# Patient Record
Sex: Female | Born: 1940 | Race: Black or African American | Hispanic: No | Smoking: Never smoker
Health system: Southern US, Community
[De-identification: ages and names within clinical notes are randomized; demographics above are authoritative.]

## PROBLEM LIST (undated history)

## (undated) DIAGNOSIS — E119 Type 2 diabetes mellitus without complications: Secondary | ICD-10-CM

## (undated) DIAGNOSIS — M1990 Unspecified osteoarthritis, unspecified site: Secondary | ICD-10-CM

## (undated) DIAGNOSIS — R001 Bradycardia, unspecified: Secondary | ICD-10-CM

## (undated) DIAGNOSIS — I10 Essential (primary) hypertension: Secondary | ICD-10-CM

## (undated) DIAGNOSIS — E7800 Pure hypercholesterolemia, unspecified: Secondary | ICD-10-CM

## (undated) DIAGNOSIS — D649 Anemia, unspecified: Secondary | ICD-10-CM

## (undated) DIAGNOSIS — K219 Gastro-esophageal reflux disease without esophagitis: Secondary | ICD-10-CM

## (undated) HISTORY — DX: Gastro-esophageal reflux disease without esophagitis: K21.9

## (undated) HISTORY — DX: Unspecified osteoarthritis, unspecified site: M19.90

## (undated) HISTORY — DX: Type 2 diabetes mellitus without complications: E11.9

## (undated) HISTORY — DX: Pure hypercholesterolemia, unspecified: E78.00

## (undated) HISTORY — DX: Anemia, unspecified: D64.9

## (undated) HISTORY — PX: CHOLECYSTECTOMY: SHX55

## (undated) HISTORY — DX: Bradycardia, unspecified: R00.1

## (undated) HISTORY — DX: Essential (primary) hypertension: I10

## (undated) HISTORY — PX: TUBAL LIGATION: SHX77

---

## 1999-03-18 HISTORY — PX: COLONOSCOPY: SHX174

## 2001-07-02 ENCOUNTER — Ambulatory Visit (HOSPITAL_COMMUNITY): Admission: RE | Admit: 2001-07-02 | Discharge: 2001-07-02 | Payer: Self-pay | Admitting: Internal Medicine

## 2001-07-02 HISTORY — PX: ESOPHAGOGASTRODUODENOSCOPY: SHX1529

## 2001-08-02 ENCOUNTER — Ambulatory Visit (HOSPITAL_COMMUNITY): Admission: RE | Admit: 2001-08-02 | Discharge: 2001-08-02 | Payer: Self-pay | Admitting: Occupational Therapy

## 2001-08-02 ENCOUNTER — Encounter: Payer: Self-pay | Admitting: Occupational Therapy

## 2002-04-03 ENCOUNTER — Encounter: Payer: Self-pay | Admitting: Emergency Medicine

## 2002-04-03 ENCOUNTER — Emergency Department (HOSPITAL_COMMUNITY): Admission: EM | Admit: 2002-04-03 | Discharge: 2002-04-03 | Payer: Self-pay | Admitting: Emergency Medicine

## 2002-08-17 ENCOUNTER — Encounter: Payer: Self-pay | Admitting: Occupational Therapy

## 2002-08-17 ENCOUNTER — Ambulatory Visit (HOSPITAL_COMMUNITY): Admission: RE | Admit: 2002-08-17 | Discharge: 2002-08-17 | Payer: Self-pay | Admitting: Occupational Therapy

## 2003-08-30 ENCOUNTER — Ambulatory Visit (HOSPITAL_COMMUNITY): Admission: RE | Admit: 2003-08-30 | Discharge: 2003-08-30 | Payer: Self-pay | Admitting: Occupational Therapy

## 2004-02-11 ENCOUNTER — Emergency Department (HOSPITAL_COMMUNITY): Admission: EM | Admit: 2004-02-11 | Discharge: 2004-02-11 | Payer: Self-pay | Admitting: Emergency Medicine

## 2004-02-13 ENCOUNTER — Ambulatory Visit: Payer: Self-pay | Admitting: Occupational Therapy

## 2004-09-04 ENCOUNTER — Ambulatory Visit (HOSPITAL_COMMUNITY): Admission: RE | Admit: 2004-09-04 | Discharge: 2004-09-04 | Payer: Self-pay | Admitting: Occupational Therapy

## 2005-10-02 ENCOUNTER — Ambulatory Visit (HOSPITAL_COMMUNITY): Admission: RE | Admit: 2005-10-02 | Discharge: 2005-10-02 | Payer: Self-pay | Admitting: Nurse Practitioner

## 2006-10-05 ENCOUNTER — Ambulatory Visit (HOSPITAL_COMMUNITY): Admission: RE | Admit: 2006-10-05 | Discharge: 2006-10-05 | Payer: Self-pay | Admitting: Nurse Practitioner

## 2010-05-05 ENCOUNTER — Encounter: Payer: Self-pay | Admitting: Family Medicine

## 2010-08-30 NOTE — Op Note (Signed)
[facility]  Patient:    HOSANNA, BETLEY Visit Number: [id number] MRN: [id number]          Service Type: END Location: DAY Attending Physician:  Jonathon Bellows Dictated by:   Roetta Sessions, M.D. Proc. Date: 07/02/01 Admit Date:  07/02/2001 Discharge Date: 07/02/2001   CC:         Sharen Hint, M.D.   Operative Report  PROCEDURE:   Esophagogastroduodenoscopy with [name] dilation followed by biopsy.  INDICATIONS FOR PROCEDURE:  The patient is a 70 year old lady with chronic GERD, well controlled on Prevacid.  She has had a 31-month history of insidious esophageal dysphagia that has led to progressive esophageal dysphagia. EGD is now being done to further evaluate her symptoms. Approach has been discussed with patient previously, potential risks, benefits, and alternatives and any questions answered.  She is agreeable.  Please see my June 07, 2001 consultation note for more information.  PROCEDURE NOTE:  O2 saturation, blood pressure, pulse, and respirations were monitored throughout the entire procedure.  Conscious sedation with Demerol 50 mg IV and Versed 2 mg IV.  She was bradycardic in the 50s and then the 40s during the procedure for which she received atropine 0.5 mg IV. Cetacaine spray for topical oropharyngeal anesthesia.  INSTRUMENT:  Olympus videochip gastroscope.  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 The scope was easily traversed across the ring. There was no esophagitis, no Barretts esophagus, and no tumor.  Stomach:  The gastric cavity insufflated well with air.  It was empty. Thorough examination of the gastric mucosa including retroflex view of the esophagogastric junction demonstrated a moderate size hernia and a 2 cm linear erosion and a 1 cm linear furrow, which really appeared to be an ulcer when inspected more closely.  This was just below the mucosa, straddling the diaphragmatic  hiatus.  Mucosa of the stomach otherwise appeared normal. Pylorus was patent and easily traversed.  Duodenum:  Bulb and second portion appeared normal.  THERAPEUTIC/DIAGNOSTIC MANEUVERS:   A #56 [place] Maloney dilator was passed to full insertion and patient tolerated the procedure well without blood return on the dilator.  Looking back revealed that the ring had been ruptured without apparent complications.  Subsequently, the linear ulcer in the distal cardia was biopsied.  The patient tolerated the procedure well and was reactive in endoscopy.  IMPRESSION: 1. Schatzkis ring, otherwise normal esophagus. 2. Status post dilation as described above. 3. Small to moderate size hiatal hernia. 4. Linear erosion and ulceration in the proximal stomach, as described above. 5. Biopsy of the remainder of the gastric mucosa and duodenum through the    second portion appeared normal. 6. The ulcerated lesion in the proximal stomach is likely benign.  This may    be a [name] lesion secondary to trauma to the mucosa, straddling the    diaphragmatic hiatus in the presence of a hiatal hernia.  RECOMMENDATIONS. 1. Continue Prevacid 30 mg orally daily. 2. Check H. pylori serologies. 3. Further recommendations to follow. Dictated by:   Roetta Sessions, M.D. Attending Physician:  Jonathon Bellows DD:  07/02/01 TD:  07/05/01 Job: [id number]

## 2011-07-10 ENCOUNTER — Encounter: Payer: Self-pay | Admitting: Internal Medicine

## 2011-07-14 ENCOUNTER — Ambulatory Visit (INDEPENDENT_AMBULATORY_CARE_PROVIDER_SITE_OTHER): Payer: Medicare PPO | Admitting: Urgent Care

## 2011-07-14 ENCOUNTER — Encounter: Payer: Self-pay | Admitting: Urgent Care

## 2011-07-14 DIAGNOSIS — Z136 Encounter for screening for cardiovascular disorders: Secondary | ICD-10-CM | POA: Insufficient documentation

## 2011-07-14 DIAGNOSIS — Z1211 Encounter for screening for malignant neoplasm of colon: Secondary | ICD-10-CM

## 2011-07-14 DIAGNOSIS — K219 Gastro-esophageal reflux disease without esophagitis: Secondary | ICD-10-CM | POA: Insufficient documentation

## 2011-07-14 DIAGNOSIS — R1310 Dysphagia, unspecified: Secondary | ICD-10-CM | POA: Insufficient documentation

## 2011-07-14 MED ORDER — PEG-KCL-NACL-NASULF-NA ASC-C 100 G PO SOLR: 1.0000 | Freq: Once | ORAL | Status: DC

## 2011-07-14 MED ORDER — DEXLANSOPRAZOLE 60 MG PO CPDR: 60.0000 mg | DELAYED_RELEASE_CAPSULE | Freq: Every day | ORAL | Status: DC

## 2011-07-14 NOTE — Assessment & Plan Note (Addendum)
Solid food dysphagia with regurgitation & chronic refractory GERD.  EGD with esophageal dilation as above.  Pt admits to not taking aspirin on a daily basis, states taking for cardiovascular preventative benefits.  Advised she may hold for 3 days prior to procedure.

## 2011-07-14 NOTE — Assessment & Plan Note (Signed)
Due for average risk screening colonoscopy w/ Dr Jena Gauss.  I have discussed risks & benefits which include, but are not limited to, bleeding, infection, perforation & drug reaction.  The patient agrees with this plan & written consent will be obtained.

## 2011-07-14 NOTE — Patient Instructions (Addendum)
EGD with possible dilation of your esophagus & colonoscopy w/ Dr Jena Gauss Stop OMEPRAZOLE START DEXILANT 60mg  daily Keep your appt with your heart doctor Dysphagia Diet Level 2, Mechanically Altered This dysphagia mechanically altered diet is restricted to:  Foods that are moist, soft-textured, and easy to chew and swallow.   Meats that are ground or minced to no larger than -inch pieces. Meats are moist with gravy or sauce added.   Foods that do not include bread or bread-like textures except soft pancakes, well-moistened with syrup or sauce.   Textures with some chewing ability required.   Casseroles without rice.   Cooked vegetables that are less than -inch in size and easily mashed with a fork. No cooked corn, peas, broccoli, cauliflower, cabbage, Brussels sprouts, asparagus, or other fibrous, non-tender, or rubbery cooked vegetables.   Canned fruit except for pineapple. Fruit must be cut into no larger than -inch pieces.   Foods that do not include nuts, seeds, coconut, or sticky textures.  FOOD TEXTURES Includes all foods listed on Dysphagia Diet Level 1, Pureed, in addition to the foods listed below. Beverages  Recommended: All beverages thickened to recommended consistency with minimal amounts of texture pulp. Any texture should be suspended in the liquid and should not fall out.   Avoid: All others.   You are currently limited to one of the following liquid consistency levels:   Thin.   Nectar-like.   Honey-like.   Spoon-thick.  Breads  Recommended: Soft pancakes, well-moistened with syrup or sauce.   Avoid: All others.  Cereals  Recommended: Cooked cereals with little texture, including oatmeal. Unprocessed wheat bran stirred into cereals for bulk. If thin liquids are restricted, it is important that all of the liquid is absorbed into the cereal.   Avoid: All dry cereals and any cooked cereals that may contain flax seeds or other seeds or nuts. Whole-grain,  dry, or coarse cereals. Cereals with nuts, seeds, dried fruit, or coconut.  Desserts  Recommended: Pudding, custard. Soft fruit pies with bottom crust only. Canned fruit (excluding pineapple). Soft, moist cakes with icing.   Avoid: Dry, coarse cakes and cookies. Anything with nuts, seeds, coconut, pineapple, or dried fruit. Breakfast yogurt with nuts. Rice or bread pudding.   These foods are considered thin liquids and should be avoided if thin liquids are restricted:   Frozen malts, milk shakes, frozen yogurt, eggnog, nutritional supplements, ice cream, sherbet, regular or sugar-free gelatin, or any foods that become thin liquid at either room temperature, 70 F (21.1 C) or body temperature, 98 F (36.7 C).  Fats  Recommended: Butter, margarine, cream for cereal (depending on liquid consistency recommendations), gravy, cream sauces, sour cream, sour cream dips with soft additives, mayonnaise, salad dressings, cream cheese, cream cheese spreads with soft additives, whipped toppings.   Avoid: All fats with coarse or chunky additives.  Fruits  Recommended: Soft drained, canned, or cooked fruits without seeds or skin. Fresh soft and ripe banana. Fruit juices with a small amount of pulp. If thin liquids are restricted, fruit juices should be thickened to appropriate consistency.   Avoid: Fresh or frozen fruits. Cooked fruit with skin or seeds. Dried fruits. Fresh, canned, or cooked pineapple.  Meats and Meat Substitutes Meat pieces should not exceed -inch cubes and should be tender.  Recommended: Moistened ground or cooked meat, poultry, or fish. Moist ground or tender meat may be served with gravy or sauce. Casseroles without rice. Moist macaroni and cheese, well-cooked pasta with meat sauce, tuna noodle  casserole, soft, moist lasagna. Moist meatballs, meatloaf, or fish loaf. Protein salads, such as tuna or egg without large chunks, celery, or onion. Cottage cheese, smooth quiche without large  chunks. Poached, scrambled, or soft-cooked eggs (egg yolks should not be "runny" but should be moist and able to be mashed with butter, margarine, or other moisture added to them). Cook eggs to 160 F (71.1 C) or use pasteurized eggs for safety. Souffls may have small, soft chunks. Tofu. Well-cooked, slightly mashed, moist legumes such as baked beans. All meats or protein substitutes should be served with sauces or moistened to help maintain cohesiveness in the mouth.   Avoid: Dry meats and tough meats, such as bacon, sausage, hot dogs, and bratwurst. Dry casseroles or casseroles with rice or large chunks. Peanut butter. Cheese slices and cubes. Hard-cooked or crisp fried eggs. Sandwiches. Pizza.  Potatoes and Starches  Recommended: Well-cooked, moistened, boiled, baked, or mashed potatoes. Well-cooked shredded hash brown potatoes that are not crisp. All potatoes need to be moist and in sauces. Well-cooked noodles in sauce. Spaetzel or soft dumplings that have been moistened with butter or gravy.   Avoid: Potato skins and chips. Fried or French-fried potatoes. Rice.  Soups  Recommended: Soups with easy-to-chew or easy-to-swallow meats or vegetables. Contents in soups should be less than -inch pieces. Soups will need to be thickened to appropriate consistency if soup is thinner than prescribed liquid consistency.   Avoid: Soups with large chunks of meat and vegetables. Soups with rice, corn, peas.  Vegetables  Recommended: All soft, well-cooked vegetables. Vegetables should be less than -inch pieces. They should be easily mashed with a fork.   Avoid: Cooked corn and peas. Broccoli, cabbage, Brussels sprouts, asparagus, or other fibrous, non-tender, or rubbery cooked vegetables.  Miscellaneous  Recommended: Jams and preserves without seeds, jelly. Sauces or salsas with small, tender, less than -inch pieces. Soft, smooth chocolate bars that are easily chewed.   Avoid: Seeds, nuts, coconut, or  sticky foods. Chewy candies such as caramels or licorice.  Document Released: 03/31/2005 Document Revised: 03/20/2011 Document Reviewed: 04/23/2009 [facility], [facility], [facility] Patient Information 2012 [place], [place].     Diet for GERD or PUD Nutrition therapy can help ease the discomfort of gastroesophageal reflux disease (GERD) and peptic ulcer disease (PUD).  HOME CARE INSTRUCTIONS   Eat your meals slowly, in a relaxed setting.   Eat 5 to 6 small meals per day.   If a food causes distress, stop eating it for a period of time.  FOODS TO AVOID  Coffee, regular or decaffeinated.   Cola beverages, regular or low calorie.   Tea, regular or decaffeinated.   Pepper.   Cocoa.   High fat foods, including meats.   Butter, margarine, hydrogenated oil (trans fats).   Peppermint or spearmint (if you have GERD).   Fruits and vegetables if not tolerated.   Alcohol.   Nicotine (smoking or chewing). This is one of the most potent stimulants to acid production in the gastrointestinal tract.   Any food that seems to aggravate your condition.  If you have questions regarding your diet, ask your caregiver or a registered dietitian. TIPS  Lying flat may make symptoms worse. Keep the head of your bed raised 6 to 9 inches (15 to 23 cm) by using a foam wedge or blocks under the legs of the bed.   Do not lay down until 3 hours after eating a meal.   Daily physical activity may help reduce symptoms.  MAKE SURE YOU:  Understand these instructions.   Will watch your condition.   Will get help right away if you are not doing well or get worse.  Document Released: 03/31/2005 Document Revised: 03/20/2011 Document Reviewed: 02/14/2011 [facility] Patient Information 2012 [place], [place].

## 2011-07-14 NOTE — Assessment & Plan Note (Addendum)
Stacey Riley is a pleasant 71 y.o. female with refractory GERD symptoms & dysphagia despite once daily omeprazole.  EGD with possible esophageal dilation w/ Dr Jena Gauss for further evaluation to r/o complicated GERD, esophageal web, ring or stricture, gastritis, PUD or less likely malignancy.  I have discussed risks & benefits which include, but are not limited to, bleeding, infection, perforation & drug reaction.  The patient agrees with this plan & written consent will be obtained.    Stop omeprazole Dexilant 60mg  daily samples given (1 box) & Rx GERD diet Keep appt with cardiologist

## 2011-07-14 NOTE — Progress Notes (Signed)
Referring Provider: Blane Russell, PAC ([facility]) Primary Care Physician:  Hall, Michelle D, FNP Primary Gastroenterologist:  Dr. Rourk  Chief Complaint  Patient presents with  . Dysphagia  . Abdominal Pain    HPI:  Stacey Riley is a 71 y.o. female here as a referral from [facility].  On March 21, started having chest pain & indigestion.  Went to [facility] a week later.  Thought she was having a heart attack.  Dx w/ GERD & bradycardia per pt.  Hx GERD taking omeprazole 20mg daily for quite some time.   Symptoms relieved by soda water.  Was previously on BID omeprazole but now once daily x 2 yrs.  C/o dysphagia where she feels like food will not go down, stuck retrosternally.  +Regurgitation undigested foods within minutes.  No problems w/ liquids, seems to help swallow solids.  Denies rectal bleeding or melena.  Weight stable.  Appetite ok.  Denies constipation or diarrhea.    Past Medical History  Diagnosis Date  . Arthritis   . Asthma   . DM (diabetes mellitus)   . HTN (hypertension)   . High cholesterol   . GERD (gastroesophageal reflux disease)     Past Surgical History  Procedure Date  . Cholecystectomy 1990s  . Tubal ligation   . Esophagogastroduodenoscopy 07/02/01    schatzkis ring otherwise normal/small hiatal hernia/   Linear erosion and ulceration in the proximal stomach/  The ulcerated lesion in the proximal stomach is likely benign.This may be a Cameron lesion secondary to trauma to the mucosa, straddling the  diaphragmatic hiatus in the presence of a hiatal hernia     Current Outpatient Prescriptions  Medication Sig Dispense Refill  . amLODipine (NORVASC) 10 MG tablet Take 10 mg by mouth daily.       . aspirin 81 MG tablet Take 81 mg by mouth daily.      . hydrochlorothiazide (HYDRODIURIL) 25 MG tablet Take 25 mg by mouth daily.       . lisinopril (PRINIVIL,ZESTRIL) 40 MG tablet Take 40 mg by mouth daily.       .  omeprazole (PRILOSEC) 20 MG capsule Take 20 mg by mouth daily.       . simvastatin (ZOCOR) 40 MG tablet Take 40 mg by mouth at bedtime.         Allergies as of 07/14/2011 - Review Complete 07/14/2011  Allergen Reaction Noted  . Sulfa antibiotics Swelling 07/14/2011    Family History:There is no known family history of colorectal carcinoma , liver disease, or inflammatory bowel disease.  Problem Relation Age of Onset  . Aneurysm Daughter   . Stroke Daughter     History   Social History  . Marital Status: Divorced    Spouse Name: N/A    Number of Children: 5  . Years of Education: N/A   Occupational History  . retired; textiles    Social History Main Topics  . Smoking status: Never Smoker   . Smokeless tobacco: Not on file  . Alcohol Use: No  . Drug Use: No  . Sexually Active: Not on file   Other Topics Concern  . Not on file   Social History Narrative   Lives alone  Review of Systems: Gen: Denies any fever, chills, sweats, anorexia, fatigue, weakness, malaise, weight loss, and sleep disorder CV: See HPI.  Denies angina, palpitations, syncope, orthopnea, PND, peripheral edema, and claudication. Resp: Denies dyspnea at rest, dyspnea with exercise,   cough, sputum, wheezing, coughing up blood, and pleurisy. GI: Denies vomiting blood, jaundice, and fecal incontinence.  GU : Denies urinary burning, blood in urine, urinary frequency, urinary hesitancy, nocturnal urination, and urinary incontinence. MS: Denies joint pain, limitation of movement, and swelling, stiffness, low back pain, extremity pain. Denies muscle weakness, cramps, atrophy.  Derm: Denies rash, itching, dry skin, hives, moles, warts, or unhealing ulcers.  Psych: Denies depression, anxiety, memory loss, suicidal ideation, hallucinations, paranoia, and confusion. Heme: Denies bruising, bleeding, and enlarged lymph nodes. Neuro:  Denies any headaches, dizziness, paresthesias. Endo:  Denies any problems with DM,  thyroid, adrenal function.  Physical Exam: BP 136/67  Pulse 66  Temp(Src) 98.2 F (36.8 C) (Temporal)  Ht 5' 4" (1.626 m)  Wt 178 lb 6.4 oz (80.922 kg)  BMI 30.62 kg/m2 General:   Alert,  Well-developed, obese, pleasant and cooperative in NAD Head:  Normocephalic and atraumatic. Eyes:  Sclera clear, no icterus.   Conjunctiva pink. Ears:  Normal auditory acuity. Nose:  No deformity, discharge, or lesions. Mouth:  No deformity or lesions,oropharynx pink & moist. Neck:  Supple; no masses or thyromegaly. Lungs:  Clear throughout to auscultation.   No wheezes, crackles, or rhonchi. No acute distress. Heart:  Regular rate and rhythm; no murmurs, clicks, rubs,  or gallops. Abdomen:  Protuberant.  Normal bowel sounds.  No bruits.  Soft, non-tender and non-distended without masses, hepatosplenomegaly or hernias noted.  No guarding or rebound tenderness.   Rectal:  Deferred. Msk:  Symmetrical without gross deformities. Normal posture. Pulses:  Normal pulses noted. Extremities:  No clubbing or edema. Neurologic:  Alert and oriented x4;  grossly normal neurologically. Skin:  Intact without significant lesions or rashes. Lymph Nodes:  No significant cervical adenopathy. Psych:  Alert and cooperative. Normal mood and affect.  

## 2011-07-15 NOTE — Progress Notes (Signed)
Faxed to PCP

## 2011-07-16 ENCOUNTER — Other Ambulatory Visit: Payer: Self-pay | Admitting: Gastroenterology

## 2011-07-16 DIAGNOSIS — Z1211 Encounter for screening for malignant neoplasm of colon: Secondary | ICD-10-CM

## 2011-07-30 ENCOUNTER — Encounter: Payer: Self-pay | Admitting: Gastroenterology

## 2011-08-05 ENCOUNTER — Encounter (HOSPITAL_COMMUNITY): Payer: Self-pay | Admitting: Pharmacy Technician

## 2011-08-05 MED ORDER — SODIUM CHLORIDE 0.45 % IV SOLN
Freq: Once | INTRAVENOUS | Status: AC
  Administered 2011-08-06: 1000 mL via INTRAVENOUS

## 2011-08-06 ENCOUNTER — Encounter (HOSPITAL_COMMUNITY)
Admission: RE | Disposition: A | Discharge status: Home / Self Care | Payer: Self-pay | Source: Ambulatory Visit | Attending: Internal Medicine

## 2011-08-06 ENCOUNTER — Ambulatory Visit: Admit: 2011-08-06 | Payer: Medicare PPO | Admitting: Gastroenterology

## 2011-08-06 ENCOUNTER — Ambulatory Visit (HOSPITAL_COMMUNITY)
Admission: RE | Admit: 2011-08-06 | Discharge: 2011-08-06 | Disposition: A | Discharge status: Home / Self Care | Payer: Medicare PPO | Source: Ambulatory Visit | Attending: Internal Medicine | Admitting: Internal Medicine

## 2011-08-06 ENCOUNTER — Encounter (HOSPITAL_COMMUNITY): Payer: Self-pay | Admitting: *Deleted

## 2011-08-06 DIAGNOSIS — K222 Esophageal obstruction: Secondary | ICD-10-CM

## 2011-08-06 DIAGNOSIS — Z1211 Encounter for screening for malignant neoplasm of colon: Secondary | ICD-10-CM

## 2011-08-06 DIAGNOSIS — R1310 Dysphagia, unspecified: Secondary | ICD-10-CM | POA: Insufficient documentation

## 2011-08-06 DIAGNOSIS — K5730 Diverticulosis of large intestine without perforation or abscess without bleeding: Secondary | ICD-10-CM | POA: Insufficient documentation

## 2011-08-06 DIAGNOSIS — Z01812 Encounter for preprocedural laboratory examination: Secondary | ICD-10-CM | POA: Insufficient documentation

## 2011-08-06 DIAGNOSIS — D126 Benign neoplasm of colon, unspecified: Secondary | ICD-10-CM

## 2011-08-06 DIAGNOSIS — K449 Diaphragmatic hernia without obstruction or gangrene: Secondary | ICD-10-CM | POA: Insufficient documentation

## 2011-08-06 HISTORY — PX: MALONEY DILATION: SHX5535

## 2011-08-06 HISTORY — PX: COLONOSCOPY WITH ESOPHAGOGASTRODUODENOSCOPY (EGD): SHX5779

## 2011-08-06 SURGERY — COLONOSCOPY WITH ESOPHAGOGASTRODUODENOSCOPY (EGD)
Anesthesia: Moderate Sedation

## 2011-08-06 MED ORDER — STERILE WATER FOR IRRIGATION IR SOLN
Status: DC | PRN
  Administered 2011-08-06: 09:00:00

## 2011-08-06 MED ORDER — MEPERIDINE HCL 100 MG/ML IJ SOLN
INTRAMUSCULAR | Status: DC | PRN
  Administered 2011-08-06: 50 mg via INTRAVENOUS

## 2011-08-06 MED ORDER — MIDAZOLAM HCL 5 MG/5ML IJ SOLN
INTRAMUSCULAR | Status: DC | PRN
  Administered 2011-08-06: 1 mg via INTRAVENOUS
  Administered 2011-08-06: 2 mg via INTRAVENOUS

## 2011-08-06 MED ORDER — MIDAZOLAM HCL 5 MG/5ML IJ SOLN
INTRAMUSCULAR | Status: AC
  Filled 2011-08-06: qty 10

## 2011-08-06 MED ORDER — MEPERIDINE HCL 100 MG/ML IJ SOLN
INTRAMUSCULAR | Status: AC
  Filled 2011-08-06: qty 1

## 2011-08-06 NOTE — Interval H&P Note (Signed)
History and Physical Interval Note:  08/06/2011 9:20 AM  Stacey Riley  has presented today for surgery, with the diagnosis of dysphagia and screening CRC  The various methods of treatment have been discussed with the patient and family. After consideration of risks, benefits and other options for treatment, the patient has consented to  Procedure(s) (LRB): COLONOSCOPY WITH ESOPHAGOGASTRODUODENOSCOPY (EGD) (N/A) SAVORY DILATION (N/A) MALONEY DILATION (N/A) as a surgical intervention .  The patients' history has been reviewed, patient examined, no change in status, stable for surgery.  I have reviewed the patients' chart and labs.  Questions were answered to the patient's satisfaction.     Eula Listen

## 2011-08-06 NOTE — Op Note (Signed)
[facility] [address] [place], [place]  [address]  COLONOSCOPY PROCEDURE REPORT  PATIENT:  Stacey, Riley  MR#:  [id number] BIRTHDATE:  02-19-1941, 70 yrs. old  GENDER:  female ENDOSCOPIST:  R. Roetta Sessions, MD FACP [facility] REF. BY:          [facility] PROCEDURE DATE:  08/06/2011 PROCEDURE:  Colonoscopy with biopsy  INDICATIONS:  Average risk colorectal cancer screening.  INFORMED CONSENT:  The risks, benefits, alternatives and imponderables including but not limited to bleeding, perforation as well as the possibility of a missed lesion have been reviewed. The potential for biopsy, lesion removal, etc. have also been discussed.  Questions have been answered.  All parties agreeable. Please see the history and physical in the medical record for more information.  MEDICATIONS:  Versed 3 mg IV and Demerol 50 mg IV in divided doses.  DESCRIPTION OF PROCEDURE:  After a digital rectal exam was performed, the EC-[id number] ([id number]) colonoscope was advanced from the anus through the rectum and colon to the area of the cecum, ileocecal valve and appendiceal orifice.  The cecum was deeply intubated.  These structures were well-seen and photographed for the record.  From the level of the cecum and ileocecal valve, the scope was slowly and cautiously withdrawn.  The mucosal surfaces were carefully surveyed utilizing scope tip deflection to facilitate fold flattening as needed.  The scope was pulled down into the rectum where a thorough examination including retroflexion was performed. <<PROCEDUREIMAGES>>  FINDINGS: Adequate preparation. Normal rectum. Pain colonic diverticula; single diminutive polyp in the base of the cecum.  THERAPEUTIC / DIAGNOSTIC MANEUVERS PERFORMED: Cecal polyp removed with cold biopsy forceps and  COMPLICATIONS:  None  CECAL WITHDRAWAL TIME: 9 minutes  IMPRESSION: Normal rectum. Colonic diverticulosis. Single cecal polyp-removed as described  above.  RECOMMENDATIONS: Followup on pathology. See EGD report  ______________________________ R. Roetta Sessions, MD Caleen Essex  CC:  n. eSIGNED:   R. Casimiro Needle Kolin Erdahl at 08/06/2011 10:04 AM  Laurena Spies, [phone]

## 2011-08-06 NOTE — Discharge Instructions (Addendum)
Colonoscopy Discharge Instructions  Read the instructions outlined below and refer to this sheet in the next few weeks. These discharge instructions provide you with general information on caring for yourself after you leave the hospital. Your doctor may also give you specific instructions. While your treatment has been planned according to the most current medical practices available, unavoidable complications occasionally occur. If you have any problems or questions after discharge, call Dr. Jena Gauss at [phone]. ACTIVITY  You may resume your regular activity, but move at a slower pace for the next 24 hours.   Take frequent rest periods for the next 24 hours.   Walking will help get rid of the air and reduce the bloated feeling in your belly (abdomen).   No driving for 24 hours (because of the medicine (anesthesia) used during the test).    Do not sign any important legal documents or operate any machinery for 24 hours (because of the anesthesia used during the test).  NUTRITION  Drink plenty of fluids.   You may resume your normal diet as instructed by your doctor.   Begin with a light meal and progress to your normal diet. Heavy or fried foods are harder to digest and may make you feel sick to your stomach (nauseated).   Avoid alcoholic beverages for 24 hours or as instructed.  MEDICATIONS  You may resume your normal medications unless your doctor tells you otherwise.  WHAT YOU CAN EXPECT TODAY  Some feelings of bloating in the abdomen.   Passage of more gas than usual.   Spotting of blood in your stool or on the toilet paper.  IF YOU HAD POLYPS REMOVED DURING THE COLONOSCOPY:  No aspirin products for 7 days or as instructed.   No alcohol for 7 days or as instructed.   Eat a soft diet for the next 24 hours.  FINDING OUT THE RESULTS OF YOUR TEST Not all test results are available during your visit. If your test results are not back during the visit, make an appointment  with your caregiver to find out the results. Do not assume everything is normal if you have not heard from your caregiver or the medical facility. It is important for you to follow up on all of your test results.  SEEK IMMEDIATE MEDICAL ATTENTION IF:  You have more than a spotting of blood in your stool.   Your belly is swollen (abdominal distention).   You are nauseated or vomiting.   You have a temperature over 101.  You have abdominal pain or discomfort that is severe or gets worse throughout the day.    Diverticulosis Diverticulosis is a common condition that develops when small pouches (diverticula) form in the wall of the colon. The risk of diverticulosis increases with age. It happens more often in people who eat a low-fiber diet. Most individuals with diverticulosis have no symptoms. Those individuals with symptoms usually experience abdominal pain, constipation, or loose stools (diarrhea). HOME CARE INSTRUCTIONS   Increase the amount of fiber in your diet as directed by your caregiver or dietician. This may reduce symptoms of diverticulosis.   Your caregiver may recommend taking a dietary fiber supplement.   Drink at least 6 to 8 glasses of water each day to prevent constipation.   Try not to strain when you have a bowel movement.   Your caregiver may recommend avoiding nuts and seeds to prevent complications, although this is still an uncertain benefit.   Only take over-the-counter or prescription medicines for  pain, discomfort, or fever as directed by your caregiver.  FOODS WITH HIGH FIBER CONTENT INCLUDE:  Fruits. Apple, peach, pear, tangerine, raisins, prunes.   Vegetables. Brussels sprouts, asparagus, broccoli, cabbage, carrot, cauliflower, romaine lettuce, spinach, summer squash, tomato, winter squash, zucchini.   Starchy Vegetables. Baked beans, kidney beans, lima beans, split peas, lentils, potatoes (with skin).   Grains. Whole wheat bread, brown rice, bran flake  cereal, plain oatmeal, white rice, shredded wheat, bran muffins.  SEEK IMMEDIATE MEDICAL CARE IF:   You develop increasing pain or severe bloating.   You have an oral temperature above 102 F (38.9 C), not controlled by medicine.   You develop vomiting or bowel movements that are bloody or black.  Document Released: 12/27/2003 Document Revised: 03/20/2011 Document Reviewed: 08/29/2009 [facility] Patient Information 2012 [place], [place].EGD Discharge instructions Please read the instructions outlined below and refer to this sheet in the next few weeks. These discharge instructions provide you with general information on caring for yourself after you leave the hospital. Your doctor may also give you specific instructions. While your treatment has been planned according to the most current medical practices available, unavoidable complications occasionally occur. If you have any problems or questions after discharge, please call your doctor. ACTIVITY You may resume your regular activity but move at a slower pace for the next 24 hours.  Take frequent rest periods for the next 24 hours.  Walking will help expel (get rid of) the air and reduce the bloated feeling in your abdomen.  No driving for 24 hours (because of the anesthesia (medicine) used during the test).  You may shower.  Do not sign any important legal documents or operate any machinery for 24 hours (because of the anesthesia used during the test).  NUTRITION Drink plenty of fluids.  You may resume your normal diet.  Begin with a light meal and progress to your normal diet.  Avoid alcoholic beverages for 24 hours or as instructed by your caregiver.  MEDICATIONS You may resume your normal medications unless your caregiver tells you otherwise.  WHAT YOU CAN EXPECT TODAY You may experience abdominal discomfort such as a feeling of fullness or "gas" pains.  FOLLOW-UP Your doctor will discuss the results of your test with you.  SEEK  IMMEDIATE MEDICAL ATTENTION IF ANY OF THE FOLLOWING OCCUR: Excessive nausea (feeling sick to your stomach) and/or vomiting.  Severe abdominal pain and distention (swelling).  Trouble swallowing.  Temperature over 101 F (37.8 C).  Rectal bleeding or vomiting of blood.      Advance diet as tolerated.  Diverticulosis and polyp information provided.  Further recommendations to follow pending review of the pathology report.

## 2011-08-06 NOTE — H&P (View-Only) (Signed)
Referring Provider: Lawson Fiscal, [facility]) Primary Care Physician:  Theodora Blow, FNP Primary Gastroenterologist:  Dr. Jena Gauss  Chief Complaint  Patient presents with  . Dysphagia  . Abdominal Pain    HPI:  Stacey Riley is a 71 y.o. female here as a referral from [facility].  On March 21, started having chest pain & indigestion.  Went to [employer] [facility] a week later.  Thought she was having a heart attack.  Dx w/ GERD & bradycardia per pt.  Hx GERD taking omeprazole 20mg  daily for quite some time.   Symptoms relieved by soda water.  Was previously on BID omeprazole but now once daily x 2 yrs.  C/o dysphagia where she feels like food will not go down, stuck retrosternally.  +Regurgitation undigested foods within minutes.  No problems w/ liquids, seems to help swallow solids.  Denies rectal bleeding or melena.  Weight stable.  Appetite ok.  Denies constipation or diarrhea.    Past Medical History  Diagnosis Date  . Arthritis   . Asthma   . DM (diabetes mellitus)   . HTN (hypertension)   . High cholesterol   . GERD (gastroesophageal reflux disease)     Past Surgical History  Procedure Date  . Cholecystectomy 1990s  . Tubal ligation   . Esophagogastroduodenoscopy 07/02/01    schatzkis ring otherwise normal/small hiatal hernia/   Linear erosion and ulceration in the proximal stomach/  The ulcerated lesion in the proximal stomach is likely benign.This may be a [name] lesion secondary to trauma to the mucosa, straddling the  diaphragmatic hiatus in the presence of a hiatal hernia     Current Outpatient Prescriptions  Medication Sig Dispense Refill  . amLODipine (NORVASC) 10 MG tablet Take 10 mg by mouth daily.       Marland Kitchen aspirin 81 MG tablet Take 81 mg by mouth daily.      . hydrochlorothiazide (HYDRODIURIL) 25 MG tablet Take 25 mg by mouth daily.       Marland Kitchen lisinopril (PRINIVIL,ZESTRIL) 40 MG tablet Take 40 mg by mouth daily.       Marland Kitchen  omeprazole (PRILOSEC) 20 MG capsule Take 20 mg by mouth daily.       . simvastatin (ZOCOR) 40 MG tablet Take 40 mg by mouth at bedtime.         Allergies as of 07/14/2011 - Review Complete 07/14/2011  Allergen Reaction Noted  . Sulfa antibiotics Swelling 07/14/2011    Family History:There is no known family history of colorectal carcinoma , liver disease, or inflammatory bowel disease.  Problem Relation Age of Onset  . Aneurysm Daughter   . Stroke Daughter     History   Social History  . Marital Status: Divorced    Spouse Name: N/A    Number of Children: 5  . Years of Education: N/A   Occupational History  . retired; Designer, fashion/clothing    Social History Main Topics  . Smoking status: Never Smoker   . Smokeless tobacco: Not on file  . Alcohol Use: No  . Drug Use: No  . Sexually Active: Not on file   Other Topics Concern  . Not on file   Social History Narrative   Lives alone  Review of Systems: Gen: Denies any fever, chills, sweats, anorexia, fatigue, weakness, malaise, weight loss, and sleep disorder CV: See HPI.  Denies angina, palpitations, syncope, orthopnea, PND, peripheral edema, and claudication. Resp: Denies dyspnea at rest, dyspnea with exercise,  cough, sputum, wheezing, coughing up blood, and pleurisy. GI: Denies vomiting blood, jaundice, and fecal incontinence.  GU : Denies urinary burning, blood in urine, urinary frequency, urinary hesitancy, nocturnal urination, and urinary incontinence. MS: Denies joint pain, limitation of movement, and swelling, stiffness, low back pain, extremity pain. Denies muscle weakness, cramps, atrophy.  Derm: Denies rash, itching, dry skin, hives, moles, warts, or unhealing ulcers.  Psych: Denies depression, anxiety, memory loss, suicidal ideation, hallucinations, paranoia, and confusion. Heme: Denies bruising, bleeding, and enlarged lymph nodes. Neuro:  Denies any headaches, dizziness, paresthesias. Endo:  Denies any problems with DM,  thyroid, adrenal function.  Physical Exam: BP 136/67  Pulse 66  Temp(Src) 98.2 F (36.8 C) (Temporal)  Ht 5\' 4"  (1.626 m)  Wt 178 lb 6.4 oz (80.922 kg)  BMI 30.62 kg/m2 General:   Alert,  Well-developed, obese, pleasant and cooperative in NAD Head:  Normocephalic and atraumatic. Eyes:  Sclera clear, no icterus.   Conjunctiva pink. Ears:  Normal auditory acuity. Nose:  No deformity, discharge, or lesions. Mouth:  No deformity or lesions,oropharynx pink & moist. Neck:  Supple; no masses or thyromegaly. Lungs:  Clear throughout to auscultation.   No wheezes, crackles, or rhonchi. No acute distress. Heart:  Regular rate and rhythm; no murmurs, clicks, rubs,  or gallops. Abdomen:  Protuberant.  Normal bowel sounds.  No bruits.  Soft, non-tender and non-distended without masses, hepatosplenomegaly or hernias noted.  No guarding or rebound tenderness.   Rectal:  Deferred. Msk:  Symmetrical without gross deformities. Normal posture. Pulses:  Normal pulses noted. Extremities:  No clubbing or edema. Neurologic:  Alert and oriented x4;  grossly normal neurologically. Skin:  Intact without significant lesions or rashes. Lymph Nodes:  No significant cervical adenopathy. Psych:  Alert and cooperative. Normal mood and affect.

## 2011-08-06 NOTE — Op Note (Signed)
[facility] [address] [place], [place]  [address]  ENDOSCOPY PROCEDURE REPORT  PATIENT:  Stacey Riley, Stacey Riley  MR#:  [id number] BIRTHDATE:  05/16/1940, 70 yrs. old  GENDER:  female  ENDOSCOPIST:  R. Roetta Sessions, MD [facility] Referred by:          [facility]  PROCEDURE DATE:  08/06/2011 PROCEDURE:  EGD with [name] dilation  INDICATIONS:   esophageal dysphagia.  INFORMED CONSENT:   The risks, benefits, limitations, alternatives and imponderables have been discussed.  The potential for biopsy, esophogeal dilation, etc. have also been reviewed.  Questions have been answered.  All parties agreeable.  Please see the history and physical in the medical record for more information.  MEDICATIONS:     Versed 3 mg IV and Demerol 50 mg IV in divided doses. Cetacaine spray for  DESCRIPTION OF PROCEDURE:   The EG-[id number] ([id number]) endoscope was introduced through the mouth and advanced to the second portion of the duodenum without difficulty or limitations.  The mucosal surfaces were surveyed very carefully during advancement of the scope and upon withdrawal.  Retroflexion view of the proximal stomach and esophagogastric junction was performed.  <<PROCEDUREIMAGES>>  FINDINGS:  Noncritical Schatzki's ring; otherwise normal esophagus. Stomach empty. Moderate size hiatal hernia. Patent pylorus.                                          Normal first and second portion of the duodenum  THERAPEUTIC / DIAGNOSTIC MANEUVERS PERFORMED:  A 56 and, subsequently, a 58 [place] Maloney dilator were passed sequentially with only mild resistance upon passage of the 58 [place] dilator. A look back revealed no apparent complication related to this maneuver, however, the ring remained intact. Utilizing biopsy forceps, 2 quadrant "bites" were taken to disrupt the ring. This was done without difficulty or apparent complication.  COMPLICATIONS:   None  IMPRESSION:  Schatzki's  ring - status post dilation and disruption as described above. Hiatal hernia.  RECOMMENDATIONS:  Continue acid suppression therapy. See colonoscopy report.  ______________________________ R. Roetta Sessions, MD [facility]  CC:  n. eSIGNED:   R. Casimiro Needle Shawnice Tilmon at 08/06/2011 09:45 AM  Laurena Spies, [phone]

## 2011-08-07 NOTE — Progress Notes (Signed)
REVIEWED.  

## 2011-08-08 ENCOUNTER — Encounter (HOSPITAL_COMMUNITY): Payer: Self-pay | Admitting: Internal Medicine

## 2011-08-09 ENCOUNTER — Encounter: Payer: Self-pay | Admitting: Internal Medicine

## 2013-04-23 ENCOUNTER — Other Ambulatory Visit: Payer: Self-pay

## 2013-04-23 ENCOUNTER — Emergency Department (HOSPITAL_COMMUNITY): Payer: Medicare PPO

## 2013-04-23 ENCOUNTER — Emergency Department (HOSPITAL_COMMUNITY)
Admission: EM | Admit: 2013-04-23 | Discharge: 2013-04-23 | Disposition: A | Discharge status: Home / Self Care | Payer: Medicare PPO | Attending: Emergency Medicine | Admitting: Emergency Medicine

## 2013-04-23 ENCOUNTER — Encounter (HOSPITAL_COMMUNITY): Payer: Self-pay | Admitting: Emergency Medicine

## 2013-04-23 DIAGNOSIS — K219 Gastro-esophageal reflux disease without esophagitis: Secondary | ICD-10-CM | POA: Insufficient documentation

## 2013-04-23 DIAGNOSIS — Z79899 Other long term (current) drug therapy: Secondary | ICD-10-CM | POA: Insufficient documentation

## 2013-04-23 DIAGNOSIS — E7800 Pure hypercholesterolemia, unspecified: Secondary | ICD-10-CM | POA: Insufficient documentation

## 2013-04-23 DIAGNOSIS — I10 Essential (primary) hypertension: Secondary | ICD-10-CM | POA: Insufficient documentation

## 2013-04-23 DIAGNOSIS — E119 Type 2 diabetes mellitus without complications: Secondary | ICD-10-CM | POA: Insufficient documentation

## 2013-04-23 DIAGNOSIS — M79609 Pain in unspecified limb: Secondary | ICD-10-CM | POA: Insufficient documentation

## 2013-04-23 DIAGNOSIS — R079 Chest pain, unspecified: Secondary | ICD-10-CM

## 2013-04-23 DIAGNOSIS — J45909 Unspecified asthma, uncomplicated: Secondary | ICD-10-CM | POA: Insufficient documentation

## 2013-04-23 DIAGNOSIS — Z9089 Acquired absence of other organs: Secondary | ICD-10-CM | POA: Insufficient documentation

## 2013-04-23 DIAGNOSIS — M129 Arthropathy, unspecified: Secondary | ICD-10-CM | POA: Insufficient documentation

## 2013-04-23 DIAGNOSIS — Z7982 Long term (current) use of aspirin: Secondary | ICD-10-CM | POA: Insufficient documentation

## 2013-04-23 LAB — CBC WITH DIFFERENTIAL/PLATELET
Basophils Absolute: 0 10*3/uL (ref 0.0–0.1)
Basophils Relative: 0 % (ref 0–1)
Eosinophils Absolute: 0.1 10*3/uL (ref 0.0–0.7)
Eosinophils Relative: 1 % (ref 0–5)
HCT: 39.7 % (ref 36.0–46.0)
HEMOGLOBIN: 12.9 g/dL (ref 12.0–15.0)
LYMPHS ABS: 3.5 10*3/uL (ref 0.7–4.0)
LYMPHS PCT: 57 % — AB (ref 12–46)
MCH: 27.8 pg (ref 26.0–34.0)
MCHC: 32.5 g/dL (ref 30.0–36.0)
MCV: 85.6 fL (ref 78.0–100.0)
MONOS PCT: 8 % (ref 3–12)
Monocytes Absolute: 0.5 10*3/uL (ref 0.1–1.0)
NEUTROS ABS: 2.1 10*3/uL (ref 1.7–7.7)
NEUTROS PCT: 34 % — AB (ref 43–77)
Platelets: 217 10*3/uL (ref 150–400)
RBC: 4.64 MIL/uL (ref 3.87–5.11)
RDW: 14.2 % (ref 11.5–15.5)
WBC: 6.2 10*3/uL (ref 4.0–10.5)

## 2013-04-23 LAB — BASIC METABOLIC PANEL
BUN: 16 mg/dL (ref 6–23)
CHLORIDE: 100 meq/L (ref 96–112)
CO2: 26 mEq/L (ref 19–32)
Calcium: 9.5 mg/dL (ref 8.4–10.5)
Creatinine, Ser: 0.9 mg/dL (ref 0.50–1.10)
GFR calc non Af Amer: 62 mL/min — ABNORMAL LOW (ref >90)
GFR, EST AFRICAN AMERICAN: 72 mL/min — AB (ref >90)
GLUCOSE: 114 mg/dL — AB (ref 70–99)
POTASSIUM: 3.5 meq/L — AB (ref 3.7–5.3)
Sodium: 141 mEq/L (ref 137–147)

## 2013-04-23 LAB — TROPONIN I: Troponin I: <0.3 ng/mL (ref <0.30)

## 2013-04-23 MED ORDER — NITROGLYCERIN 0.4 MG SL SUBL: 0.4000 mg | SUBLINGUAL_TABLET | SUBLINGUAL | Status: DC | PRN

## 2013-04-23 NOTE — ED Notes (Signed)
Dr Cook at bedside,  

## 2013-04-23 NOTE — ED Notes (Signed)
Pt c/o mid center chest pain described as intermittent sharp at times and dull at other times that started last night, denies any sob, n/v, diaphoresis, pain does radiate to left arm and shoulder blades area. Has had pain like this before but it went away.

## 2013-04-23 NOTE — ED Provider Notes (Signed)
CSN: [id number]     Arrival date & time 04/23/13  1635 History   First MD Initiated Contact with Patient 04/23/13 1753     This chart was scribed for Stacey Christen, MD by Forrestine Him, ED Scribe. This patient was seen in room [facility]19/[facility] and the patient's care was started 5:45 PM.   Chief Complaint  Patient presents with  . Chest Pain   The history is provided by the patient. No language interpreter was used.    HPI Comments: Stacey Riley is a 73 y.o. female with a h/o DM, HTN, high cholesterol, and bradycardia who presents to the Emergency Department complaining of gradual onset, unchanged, intermittent, moderate right sided CP that started around 2 AM this morning, and stopped about 6 AM. Pt states the same CP returned around 3 PM this afternoon. She states the pain radiates in to her left shoulder blade, and describes the pain as "burning".  Pt also reports pain to her left arm, but states this is not new for her. Pt states she currently is not experiencing the CP. She denies diaphoresis, emesis, or nausea. She denies a h/o MI.   Pt plans to follow up with her Cardiologist this coming week.  Past Medical History  Diagnosis Date  . Arthritis   . Asthma   . DM (diabetes mellitus)   . HTN (hypertension)   . High cholesterol   . GERD (gastroesophageal reflux disease)   . Bradycardia    Past Surgical History  Procedure Laterality Date  . Cholecystectomy  1990s  . Tubal ligation    . Esophagogastroduodenoscopy  07/02/01    Dr Rourk-Schatzki's ring s/p 48F maloney dilation, otherwise normal/small hiatal hernia/   Linear erosion and ulceration in the proximal stomach/  The ulcerated lesion in the proximal stomach  benign.This may be a [name] lesion secondary to trauma to the mucosa, straddling the  diaphragmatic hiatus in the presence of a hiatal hernia   . Colonoscopy  03/18/1999    Dr Rourk-int  hemorrhoids, pancolonic diverticula  . Maloney dilation  08/06/2011    Procedure: Venia Minks  DILATION;  Surgeon: Daneil Dolin, MD;  Location: [facility];  Service: Endoscopy;  Laterality: N/A;   Family History  Problem Relation Age of Onset  . Aneurysm Daughter   . Stroke Daughter    History  Substance Use Topics  . Smoking status: Never Smoker   . Smokeless tobacco: Not on file  . Alcohol Use: No   OB History   Grav Para Term Preterm Abortions TAB SAB Ect Mult Living                 Review of Systems  A complete 10 system review of systems was obtained and all systems are negative except as noted in the HPI and PMH.   Allergies  Sulfa antibiotics  Home Medications   Current Outpatient Rx  Name  Route  Sig  Dispense  Refill  . albuterol (PROAIR HFA) 108 (90 BASE) MCG/ACT inhaler   Inhalation   Inhale 2 puffs into the lungs every 6 (six) hours as needed for wheezing or shortness of breath.         Marland Kitchen amLODipine (NORVASC) 10 MG tablet   Oral   Take 10 mg by mouth every morning.          Marland Kitchen aspirin 81 MG tablet   Oral   Take 81 mg by mouth every morning.          Marland Kitchen  hydrochlorothiazide (HYDRODIURIL) 25 MG tablet   Oral   Take 25 mg by mouth every morning.          Marland Kitchen lisinopril (PRINIVIL,ZESTRIL) 40 MG tablet   Oral   Take 40 mg by mouth every morning.          . Omega-3 Fatty Acids (FISH OIL) 1000 MG CAPS   Oral   Take 1 capsule by mouth 2 (two) times daily.         Marland Kitchen omeprazole (PRILOSEC) 20 MG capsule   Oral   Take 20 mg by mouth every morning.         . pravastatin (PRAVACHOL) 20 MG tablet   Oral   Take 20 mg by mouth at bedtime.         Marland Kitchen EXPIRED: dexlansoprazole (DEXILANT) 60 MG capsule   Oral   Take 1 capsule (60 mg total) by mouth daily.   31 capsule   2   . nitroGLYCERIN (NITROSTAT) 0.4 MG SL tablet   Sublingual   Place 1 tablet (0.4 mg total) under the tongue every 5 (five) minutes as needed for chest pain.   30 tablet   0    Triage Vitals: BP 153/48  Pulse 99  Temp(Src) 97.9 F (36.6 C) (Oral)  Resp 18   SpO2 99%  Physical Exam  Nursing note and vitals reviewed. Constitutional: She is oriented to person, place, and time. She appears well-developed and well-nourished.  HENT:  Head: Normocephalic and atraumatic.  Eyes: Conjunctivae and EOM are normal. Pupils are equal, round, and reactive to light.  Neck: Normal range of motion. Neck supple.  Cardiovascular: Normal rate, regular rhythm and normal heart sounds.   Pulmonary/Chest: Effort normal and breath sounds normal.  Abdominal: Soft. Bowel sounds are normal.  Musculoskeletal: Normal range of motion.  Neurological: She is alert and oriented to person, place, and time.  Skin: Skin is warm and dry.  Psychiatric: She has a normal mood and affect. Her behavior is normal.    ED Course  Procedures (including critical care time)  DIAGNOSTIC STUDIES: Oxygen Saturation is 99% on RA, Normal by my interpretation.    COORDINATION OF CARE: 5:50 PM- Will order EKG, Troponin, and chest X-Ray. Discussed treatment plan with pt at bedside and pt agreed to plan.     Labs Review Labs Reviewed  CBC WITH DIFFERENTIAL - Abnormal; Notable for the following:    Neutrophils Relative % 34 (*)    Lymphocytes Relative 57 (*)    All other components within normal limits  BASIC METABOLIC PANEL - Abnormal; Notable for the following:    Potassium 3.5 (*)    Glucose, Bld 114 (*)    GFR calc non Af Amer 62 (*)    GFR calc Af Amer 72 (*)    All other components within normal limits  TROPONIN I   Imaging Review Dg Chest 2 View  04/23/2013   CLINICAL DATA:  Chest pain  EXAM: CHEST  2 VIEW  COMPARISON:  None.  FINDINGS: Cardiac shadow is within normal limits. A moderate-sized hiatal hernia is seen. The lungs are clear bilaterally. No bony abnormality is noted.  IMPRESSION: Hiatal hernia.  No acute intrathoracic abnormality is noted.   Electronically Signed   By: Inez Catalina M.D.   On: 04/23/2013 17:32    EKG Interpretation   None      Date: 04/23/2013   Rate: 66  Rhythm: normal sinus rhythm c SA  QRS Axis: normal  Intervals: normal  ST/T Wave abnormalities: normal  Conduction Disutrbances: none  Narrative Interpretation: unremarkable LVH    MDM   1. Chest pain    Patient is hemodynamically stable. No chest pain at present time. EKG and troponin negative. Patient has a cardiology visit on Thursday. Rx nitroglycerin  I personally performed the services described in this documentation, which was scribed in my presence. The recorded information has been reviewed and is accurate.   Stacey Christen, MD 04/23/13 2025

## 2013-04-23 NOTE — Discharge Instructions (Signed)
Screening tests were normal. Followup with  cardiologist on Thursday.  Can try nitroglycerin for chest pain. Prescription given. Return if worse.

## 2016-02-14 ENCOUNTER — Encounter (HOSPITAL_COMMUNITY)
Admission: EM | Disposition: A | Discharge status: Home / Self Care | Payer: Self-pay | Source: Home / Self Care | Attending: Internal Medicine

## 2016-02-14 ENCOUNTER — Observation Stay (HOSPITAL_COMMUNITY)
Admission: EM | Admit: 2016-02-14 | Discharge: 2016-02-15 | Disposition: A | Discharge status: Home / Self Care | Payer: Medicare HMO | Attending: Internal Medicine | Admitting: Internal Medicine

## 2016-02-14 ENCOUNTER — Encounter (HOSPITAL_COMMUNITY): Payer: Self-pay

## 2016-02-14 DIAGNOSIS — I10 Essential (primary) hypertension: Secondary | ICD-10-CM | POA: Diagnosis present

## 2016-02-14 DIAGNOSIS — J45909 Unspecified asthma, uncomplicated: Secondary | ICD-10-CM | POA: Diagnosis not present

## 2016-02-14 DIAGNOSIS — Z880 Allergy status to penicillin: Secondary | ICD-10-CM | POA: Diagnosis not present

## 2016-02-14 DIAGNOSIS — K920 Hematemesis: Secondary | ICD-10-CM | POA: Diagnosis not present

## 2016-02-14 DIAGNOSIS — E7800 Pure hypercholesterolemia, unspecified: Secondary | ICD-10-CM | POA: Insufficient documentation

## 2016-02-14 DIAGNOSIS — K449 Diaphragmatic hernia without obstruction or gangrene: Secondary | ICD-10-CM | POA: Insufficient documentation

## 2016-02-14 DIAGNOSIS — J209 Acute bronchitis, unspecified: Secondary | ICD-10-CM | POA: Diagnosis present

## 2016-02-14 DIAGNOSIS — K219 Gastro-esophageal reflux disease without esophagitis: Secondary | ICD-10-CM | POA: Diagnosis present

## 2016-02-14 DIAGNOSIS — Z7982 Long term (current) use of aspirin: Secondary | ICD-10-CM | POA: Insufficient documentation

## 2016-02-14 DIAGNOSIS — E785 Hyperlipidemia, unspecified: Secondary | ICD-10-CM | POA: Diagnosis present

## 2016-02-14 DIAGNOSIS — K922 Gastrointestinal hemorrhage, unspecified: Secondary | ICD-10-CM | POA: Diagnosis present

## 2016-02-14 DIAGNOSIS — E119 Type 2 diabetes mellitus without complications: Secondary | ICD-10-CM

## 2016-02-14 DIAGNOSIS — E1165 Type 2 diabetes mellitus with hyperglycemia: Secondary | ICD-10-CM | POA: Insufficient documentation

## 2016-02-14 DIAGNOSIS — K3189 Other diseases of stomach and duodenum: Secondary | ICD-10-CM

## 2016-02-14 HISTORY — PX: ESOPHAGOGASTRODUODENOSCOPY: SHX5428

## 2016-02-14 LAB — CBC WITH DIFFERENTIAL/PLATELET
Basophils Absolute: 0 10*3/uL (ref 0.0–0.1)
Basophils Relative: 0 %
Eosinophils Absolute: 0 10*3/uL (ref 0.0–0.7)
Eosinophils Relative: 0 %
HCT: 32 % — ABNORMAL LOW (ref 36.0–46.0)
Hemoglobin: 10.4 g/dL — ABNORMAL LOW (ref 12.0–15.0)
Lymphocytes Relative: 25 %
Lymphs Abs: 2 10*3/uL (ref 0.7–4.0)
MCH: 27.2 pg (ref 26.0–34.0)
MCHC: 32.5 g/dL (ref 30.0–36.0)
MCV: 83.6 fL (ref 78.0–100.0)
Monocytes Absolute: 0.5 10*3/uL (ref 0.1–1.0)
Monocytes Relative: 6 %
Neutro Abs: 5.5 10*3/uL (ref 1.7–7.7)
Neutrophils Relative %: 69 %
Platelets: 170 10*3/uL (ref 150–400)
RBC: 3.83 MIL/uL — ABNORMAL LOW (ref 3.87–5.11)
RDW: 14.7 % (ref 11.5–15.5)
WBC: 8 10*3/uL (ref 4.0–10.5)

## 2016-02-14 LAB — GLUCOSE, CAPILLARY
GLUCOSE-CAPILLARY: 107 mg/dL — AB (ref 65–99)
GLUCOSE-CAPILLARY: 97 mg/dL (ref 65–99)
Glucose-Capillary: 93 mg/dL (ref 65–99)
Glucose-Capillary: 98 mg/dL (ref 65–99)

## 2016-02-14 LAB — BASIC METABOLIC PANEL
Anion gap: 8 (ref 5–15)
Anion gap: 5 (ref 5–15)
BUN: 32 mg/dL — ABNORMAL HIGH (ref 6–20)
BUN: 31 mg/dL — AB (ref 6–20)
CALCIUM: 8 mg/dL — AB (ref 8.9–10.3)
CO2: 27 mmol/L (ref 22–32)
CO2: 28 mmol/L (ref 22–32)
CREATININE: 0.65 mg/dL (ref 0.44–1.00)
Calcium: 8.5 mg/dL — ABNORMAL LOW (ref 8.9–10.3)
Chloride: 100 mmol/L — ABNORMAL LOW (ref 101–111)
Chloride: 104 mmol/L (ref 101–111)
Creatinine, Ser: 0.79 mg/dL (ref 0.44–1.00)
GFR calc Af Amer: >60 mL/min (ref >60)
GFR calc non Af Amer: >60 mL/min (ref >60)
GFR calc non Af Amer: >60 mL/min (ref >60)
Glucose, Bld: 192 mg/dL — ABNORMAL HIGH (ref 65–99)
Glucose, Bld: 137 mg/dL — ABNORMAL HIGH (ref 65–99)
Potassium: 3.5 mmol/L (ref 3.5–5.1)
Potassium: 4 mmol/L (ref 3.5–5.1)
Sodium: 135 mmol/L (ref 135–145)
Sodium: 137 mmol/L (ref 135–145)

## 2016-02-14 LAB — CBC
HCT: 30 % — ABNORMAL LOW (ref 36.0–46.0)
Hemoglobin: 9.6 g/dL — ABNORMAL LOW (ref 12.0–15.0)
MCH: 26.9 pg (ref 26.0–34.0)
MCHC: 32 g/dL (ref 30.0–36.0)
MCV: 84 fL (ref 78.0–100.0)
PLATELETS: 162 10*3/uL (ref 150–400)
RBC: 3.57 MIL/uL — AB (ref 3.87–5.11)
RDW: 14.8 % (ref 11.5–15.5)
WBC: 7.1 10*3/uL (ref 4.0–10.5)

## 2016-02-14 LAB — TYPE AND SCREEN
ABO/RH(D): O POS
Antibody Screen: NEGATIVE

## 2016-02-14 LAB — LIPASE, BLOOD: Lipase: 15 U/L (ref 11–51)

## 2016-02-14 LAB — POC OCCULT BLOOD, ED: FECAL OCCULT BLD: POSITIVE — AB

## 2016-02-14 SURGERY — EGD (ESOPHAGOGASTRODUODENOSCOPY)
Anesthesia: Moderate Sedation

## 2016-02-14 MED ORDER — ONDANSETRON HCL 4 MG PO TABS: 4.0000 mg | ORAL_TABLET | Freq: Four times a day (QID) | ORAL | Status: DC | PRN

## 2016-02-14 MED ORDER — LACTATED RINGERS IV SOLN
INTRAVENOUS | Status: DC
  Administered 2016-02-14: 05:00:00 via INTRAVENOUS

## 2016-02-14 MED ORDER — ACETAMINOPHEN 325 MG PO TABS
650.0000 mg | ORAL_TABLET | Freq: Four times a day (QID) | ORAL | Status: DC | PRN
  Administered 2016-02-15: 650 mg via ORAL
  Filled 2016-02-14: qty 2

## 2016-02-14 MED ORDER — GUAIFENESIN-CODEINE 100-10 MG/5ML PO SOLN: 5.0000 mL | Freq: Four times a day (QID) | ORAL | Status: DC | PRN

## 2016-02-14 MED ORDER — STERILE WATER FOR IRRIGATION IR SOLN
Status: DC | PRN
  Administered 2016-02-14: 2.5 mL

## 2016-02-14 MED ORDER — MEPERIDINE HCL 100 MG/ML IJ SOLN
INTRAMUSCULAR | Status: AC
  Filled 2016-02-14: qty 2

## 2016-02-14 MED ORDER — HYDROCHLOROTHIAZIDE 25 MG PO TABS
25.0000 mg | ORAL_TABLET | Freq: Every morning | ORAL | Status: DC
  Administered 2016-02-15: 25 mg via ORAL
  Filled 2016-02-14: qty 1

## 2016-02-14 MED ORDER — SODIUM CHLORIDE 0.9 % IV SOLN
80.0000 mg | Freq: Once | INTRAVENOUS | Status: AC
  Administered 2016-02-14: 80 mg via INTRAVENOUS
  Filled 2016-02-14: qty 80

## 2016-02-14 MED ORDER — ONDANSETRON HCL 4 MG/2ML IJ SOLN
INTRAMUSCULAR | Status: DC | PRN
  Administered 2016-02-14: 4 mg via INTRAVENOUS

## 2016-02-14 MED ORDER — LISINOPRIL 10 MG PO TABS: 40.0000 mg | ORAL_TABLET | Freq: Every morning | ORAL | Status: DC

## 2016-02-14 MED ORDER — MIDAZOLAM HCL 5 MG/5ML IJ SOLN
INTRAMUSCULAR | Status: AC
  Filled 2016-02-14: qty 10

## 2016-02-14 MED ORDER — LIDOCAINE VISCOUS 2 % MT SOLN
OROMUCOSAL | Status: DC | PRN
  Administered 2016-02-14: 1 via OROMUCOSAL

## 2016-02-14 MED ORDER — PANTOPRAZOLE SODIUM 40 MG IV SOLR
INTRAVENOUS | Status: AC
  Filled 2016-02-14: qty 160

## 2016-02-14 MED ORDER — SODIUM CHLORIDE 0.9 % IV SOLN
INTRAVENOUS | Status: DC
  Administered 2016-02-14: 1000 mL via INTRAVENOUS

## 2016-02-14 MED ORDER — MEPERIDINE HCL 100 MG/ML IJ SOLN
INTRAMUSCULAR | Status: DC | PRN
  Administered 2016-02-14: 50 mg via INTRAVENOUS

## 2016-02-14 MED ORDER — SUCRALFATE 1 GM/10ML PO SUSP
1.0000 g | Freq: Three times a day (TID) | ORAL | Status: DC
  Administered 2016-02-14 – 2016-02-15 (×4): 1 g via ORAL
  Filled 2016-02-14 (×5): qty 10

## 2016-02-14 MED ORDER — ONDANSETRON HCL 4 MG/2ML IJ SOLN
INTRAMUSCULAR | Status: AC
  Filled 2016-02-14: qty 2

## 2016-02-14 MED ORDER — ALBUTEROL SULFATE (2.5 MG/3ML) 0.083% IN NEBU: 3.0000 mL | INHALATION_SOLUTION | Freq: Four times a day (QID) | RESPIRATORY_TRACT | Status: DC | PRN

## 2016-02-14 MED ORDER — SODIUM CHLORIDE 0.9 % IV SOLN
8.0000 mg/h | INTRAVENOUS | Status: DC
  Administered 2016-02-14: 8 mg/h via INTRAVENOUS
  Filled 2016-02-14 (×4): qty 80

## 2016-02-14 MED ORDER — MIDAZOLAM HCL 5 MG/5ML IJ SOLN
INTRAMUSCULAR | Status: DC | PRN
  Administered 2016-02-14: 2 mg via INTRAVENOUS

## 2016-02-14 MED ORDER — ACETAMINOPHEN 650 MG RE SUPP: 650.0000 mg | Freq: Four times a day (QID) | RECTAL | Status: DC | PRN

## 2016-02-14 MED ORDER — ONDANSETRON HCL 4 MG/2ML IJ SOLN: 4.0000 mg | Freq: Four times a day (QID) | INTRAMUSCULAR | Status: DC | PRN

## 2016-02-14 MED ORDER — LACTATED RINGERS IV SOLN
INTRAVENOUS | Status: AC
  Administered 2016-02-14: 5000 mL via INTRAVENOUS

## 2016-02-14 MED ORDER — FUROSEMIDE 10 MG/ML IJ SOLN
40.0000 mg | Freq: Once | INTRAMUSCULAR | Status: AC
  Administered 2016-02-14: 40 mg via INTRAVENOUS
  Filled 2016-02-14: qty 4

## 2016-02-14 MED ORDER — LORATADINE 10 MG PO TABS
10.0000 mg | ORAL_TABLET | Freq: Every day | ORAL | Status: DC
  Administered 2016-02-15: 10 mg via ORAL
  Filled 2016-02-14: qty 1

## 2016-02-14 MED ORDER — LACTATED RINGERS IV SOLN
INTRAVENOUS | Status: DC
  Administered 2016-02-14: 500 mL via INTRAVENOUS

## 2016-02-14 MED ORDER — LIDOCAINE VISCOUS 2 % MT SOLN
OROMUCOSAL | Status: AC
  Filled 2016-02-14: qty 15

## 2016-02-14 MED ORDER — PANTOPRAZOLE SODIUM 40 MG PO TBEC
40.0000 mg | DELAYED_RELEASE_TABLET | Freq: Two times a day (BID) | ORAL | Status: DC
  Administered 2016-02-14 – 2016-02-15 (×2): 40 mg via ORAL
  Filled 2016-02-14 (×3): qty 1

## 2016-02-14 MED ORDER — SODIUM CHLORIDE 0.9 % IV BOLUS (SEPSIS)
1000.0000 mL | Freq: Once | INTRAVENOUS | Status: AC
  Administered 2016-02-14: 1000 mL via INTRAVENOUS

## 2016-02-14 MED ORDER — INSULIN ASPART 100 UNIT/ML ~~LOC~~ SOLN: 0.0000 [IU] | Freq: Three times a day (TID) | SUBCUTANEOUS | Status: DC

## 2016-02-14 MED ORDER — PRAVASTATIN SODIUM 10 MG PO TABS
20.0000 mg | ORAL_TABLET | Freq: Every day | ORAL | Status: DC
  Administered 2016-02-14: 20 mg via ORAL
  Filled 2016-02-14: qty 2

## 2016-02-14 MED ORDER — AMLODIPINE BESYLATE 5 MG PO TABS
10.0000 mg | ORAL_TABLET | Freq: Every morning | ORAL | Status: DC
  Administered 2016-02-15: 10 mg via ORAL
  Filled 2016-02-14: qty 2

## 2016-02-14 MED ORDER — ONDANSETRON HCL 4 MG/2ML IJ SOLN
4.0000 mg | Freq: Once | INTRAMUSCULAR | Status: AC
  Administered 2016-02-14: 4 mg via INTRAVENOUS
  Filled 2016-02-14: qty 2

## 2016-02-14 MED ORDER — SODIUM CHLORIDE 0.9% FLUSH
INTRAVENOUS | Status: AC
  Filled 2016-02-14: qty 10

## 2016-02-14 NOTE — H&P (Signed)
History and Physical    Stacey Riley [id number] DOB: [birth year] DOA: 02/14/2016  PCP: [facility] Consultants:  Linzie Collin - cardiology; Gala Romney - GI Patient coming from: home - lives alone; [place]: children [phone]; [phone]  Chief Complaint: vomiting blood  HPI: Stacey Riley is a 74 y.o. female with medical history significant of DM (diet controlled), HTN, HLD, and GERD presenting with upper GI bleeding.  The patient had an episode of vomiting on Saturday and then had an "asthma attack" for which she was seen at her PCP's office on Tuesday.  She was diagnosed as having fluid on her ears and given Augmentin (as well as Robitussin AC and Albuterol).  Did okay with it on Tuesday but starting around noon on Wednesday she started having vomiting and diarrhea.  Estimates 10+ episodes of vomiting and diarrhea.  Does not remember every taking Augmentin in the past.  With the onset of vomiting, also started with hematemesis - dark brown/black in color.  No h/o hematemesis.  Had upper and lower scopes in April 5 years ago by Dr. Gala Romney.  + weakness, SOB since this started.  No abdominal pain.  Head feels "swimmy like".   ED Course:  Hemoglobin drop in the setting of apparent upper GI bleed, increased BUN.  Started on Protonix drip and called hospitalist for admission.  Review of Systems: As per HPI; otherwise 10 point review of systems reviewed and negative.   Ambulatory Status:  Ambulates without assistance  Past Medical History:  Diagnosis Date  . Arthritis   . Asthma   . Bradycardia   . DM (diabetes mellitus) ([place])   . GERD (gastroesophageal reflux disease)   . High cholesterol   . HTN (hypertension)     Past Surgical History:  Procedure Laterality Date  . CHOLECYSTECTOMY  1990s  . COLONOSCOPY  03/18/1999   Dr Rourk-int  hemorrhoids, pancolonic diverticula  . ESOPHAGOGASTRODUODENOSCOPY  07/02/01   Dr Rourk-Schatzki's ring s/p 70F maloney dilation, otherwise  normal/small hiatal hernia/   Linear erosion and ulceration in the proximal stomach/  The ulcerated lesion in the proximal stomach  benign.This may be a [name] lesion secondary to trauma to the mucosa, straddling the  diaphragmatic hiatus in the presence of a hiatal hernia   . MALONEY DILATION  08/06/2011   Procedure: Venia Minks DILATION;  Surgeon: Daneil Dolin, MD;  Location: [facility];  Service: Endoscopy;  Laterality: N/A;  . TUBAL LIGATION      Social History   Social History  . Marital status: Divorced    Spouse name: N/A  . Number of children: 5  . Years of education: N/A   Occupational History  . retired; Charity fundraiser    Social History Main Topics  . Smoking status: Never Smoker  . Smokeless tobacco: Never Used  . Alcohol use No  . Drug use: No  . Sexual activity: Not on file   Other Topics Concern  . Not on file   Social History Narrative   Lives alone    Allergies  Allergen Reactions  . Sulfa Antibiotics Swelling    Family History  Problem Relation Age of Onset  . Aneurysm Daughter   . Stroke Daughter     Prior to Admission medications   Medication Sig Start Date End Date Taking? Authorizing Provider  amoxicillin-clavulanate (AUGMENTIN) 875-125 MG tablet Take 1 tablet by mouth 2 (two) times daily.   Yes Historical Provider, MD  guaiFENesin-codeine (ROBITUSSIN AC) 100-10 MG/5ML syrup Take 5 mLs  by mouth 4 (four) times daily as needed for cough.   Yes Historical Provider, MD  albuterol (PROAIR HFA) 108 (90 BASE) MCG/ACT inhaler Inhale 2 puffs into the lungs every 6 (six) hours as needed for wheezing or shortness of breath.    Historical Provider, MD  amLODipine (NORVASC) 10 MG tablet Take 10 mg by mouth every morning.  04/22/11   Historical Provider, MD  aspirin 81 MG tablet Take 81 mg by mouth every morning.     Historical Provider, MD  dexlansoprazole (DEXILANT) 60 MG capsule Take 1 capsule (60 mg total) by mouth daily. 07/14/11 08/13/11  Andria Meuse, NP    hydrochlorothiazide (HYDRODIURIL) 25 MG tablet Take 25 mg by mouth every morning.  04/22/11   Historical Provider, MD  lisinopril (PRINIVIL,ZESTRIL) 40 MG tablet Take 40 mg by mouth every morning.  04/22/11   Historical Provider, MD  nitroGLYCERIN (NITROSTAT) 0.4 MG SL tablet Place 1 tablet (0.4 mg total) under the tongue every 5 (five) minutes as needed for chest pain. 04/23/13   [name], MD  Omega-3 Fatty Acids (FISH OIL) 1000 MG CAPS Take 1 capsule by mouth 2 (two) times daily.    Historical Provider, MD  omeprazole (PRILOSEC) 20 MG capsule Take 20 mg by mouth every morning.    Historical Provider, MD  pravastatin (PRAVACHOL) 20 MG tablet Take 20 mg by mouth at bedtime.    Historical Provider, MD    Physical Exam: Vitals:   02/14/16 0207 02/14/16 0300 02/14/16 0400 02/14/16 0430  BP:  131/57 104/59 119/60  Pulse:  83 84 82  Resp:  18 18 19   SpO2:  100% 99% 98%  Weight: 78 kg (172 lb)     Height: 5\' 4"  (1.626 m)        General: Appears calm and comfortable and is NAD Eyes:  PERRL, EOMI, normal lids, iris ENT:  grossly normal hearing, lips & tongue, mmm; there is a reddish discoloration along the tongue that appears to be old blood; TMs gray and pearly with mild bulge Neck:  no LAD, masses or thyromegaly Cardiovascular:  RRR, no m/r/g. No LE edema.  Respiratory:  CTA bilaterally, no w/r/r. Normal respiratory effort. Abdomen:  soft, ntnd, NABS Skin:  no rash or induration seen on limited exam Musculoskeletal:  grossly normal tone BUE/BLE, good ROM, no bony abnormality Psychiatric:  grossly normal mood and affect, speech fluent and appropriate, AOx3 Neurologic:  CN 2-12 grossly intact, moves all extremities in coordinated fashion, sensation intact  Labs on Admission: I have personally reviewed following labs and imaging studies  CBC:  Recent Labs Lab 02/14/16 0235  WBC 8.0  NEUTROABS 5.5  HGB 10.4*  HCT 32.0*  MCV 83.6  PLT 123XX123   Basic Metabolic Panel:  Recent Labs Lab  02/14/16 0235  NA 135  K 3.5  CL 100*  CO2 27  GLUCOSE 192*  BUN 32*  CREATININE 0.79  CALCIUM 8.5*   GFR: Estimated Creatinine Clearance: 60.4 mL/min (by C-G formula based on SCr of 0.79 mg/dL). Liver Function Tests: No results for input(s): AST, ALT, ALKPHOS, BILITOT, PROT, ALBUMIN in the last 168 hours.  Recent Labs Lab 02/14/16 0235  LIPASE 15   No results for input(s): AMMONIA in the last 168 hours. Coagulation Profile: No results for input(s): INR, PROTIME in the last 168 hours. Cardiac Enzymes: No results for input(s): CKTOTAL, CKMB, CKMBINDEX, TROPONINI in the last 168 hours. BNP (last 3 results) No results for input(s): PROBNP in the last  8760 hours. HbA1C: No results for input(s): HGBA1C in the last 72 hours. CBG: No results for input(s): GLUCAP in the last 168 hours. Lipid Profile: No results for input(s): CHOL, HDL, LDLCALC, TRIG, CHOLHDL, LDLDIRECT in the last 72 hours. Thyroid Function Tests: No results for input(s): TSH, T4TOTAL, FREET4, T3FREE, THYROIDAB in the last 72 hours. Anemia Panel: No results for input(s): VITAMINB12, FOLATE, FERRITIN, TIBC, IRON, RETICCTPCT in the last 72 hours. Urine analysis: No results found for: COLORURINE, APPEARANCEUR, LABSPEC, PHURINE, GLUCOSEU, HGBUR, BILIRUBINUR, KETONESUR, PROTEINUR, UROBILINOGEN, NITRITE, LEUKOCYTESUR  Creatinine Clearance: Estimated Creatinine Clearance: 60.4 mL/min (by C-G formula based on SCr of 0.79 mg/dL).  Sepsis Labs: @LABRCNTIP (procalcitonin:4,lacticidven:4) )No results found for this or any previous visit (from the past 240 hour(s)).   Radiological Exams on Admission: No results found.  EKG: Independently reviewed.  NSR with rate 69; LVH with IVCD and secondary repol abnrm with no evidence of acute ischemia  Assessment/Plan Principal Problem:   UGIB (upper gastrointestinal bleed) Active Problems:   GERD (gastroesophageal reflux disease)   Diabetes (HCC)   Essential hypertension    Hyperlipidemia   Acute bronchitis   Upper GI bleed -Likely related to common GI toxicity associated with Augmentin, in conjunction with Mallory-Weiss tear or esophagitis -Patient denies abdominal pain -Elevated BUN supports the diagnosis -Last prior Hgb in the system was in 1/15 and was 12.9, currently 10.4 -Will admit for ongoing monitoring, as patient endorses symptoms that would be consistent with symptomatic anemia and would justify transfusion if Hgb continues to downtrend -NPO  -If she has ongoing vomiting, will need NGT to LIWS  -Will start LR at 125 for now; will increase to 150/h if bp is low: Goal MAP>60  -Protonix bolus and gtt started in ER -GI consult, Dr. Gala Romney saw patient previously -CBC q6h; transfuse for Hgb <7 -blood culture if patient becomes febrile  GERD -One prior rx for Dexilant in 2013 but otherwise has been taking omeprazole -May require escalation in PPI for at least the short-term  DM -Does not appear to be taking medication for this issue -Glucose on admission was 192 -Will check A1c and cover with SSI  HTN -Continue CCB, HCTZ, and ACE -Monitor BP -Giving IVF to try to fend off AKI associated with diuretic and ACE in the setting of GI bleeding -Currently normal creatinine (with elevated BUN associated with GI bleed)  HLD -Continue Pravachol  Bronchitis -No evidence of otitis on PE -Antibiotics are not indicated so will discontinue Augmentin -Claritin daily for sensation of ear fullness -Continue Robitussin AC and Albuterol -Will add Augmentin to allergy list   DVT prophylaxis:  SCDs Code Status: Full - confirmed with patient/family Family Communication: Son and daughter present throughout evaluation Disposition Plan:  Home once clinically improved Consults called: GI - Dr. Gala Romney  Admission status: Admit - It is my clinical opinion that admission to INPATIENT is reasonable and necessary because this patient will require at least 2 midnights  in the hospital to treat this condition based on the medical complexity of the problems presented.  Given the aforementioned information, the predictability of an adverse outcome is felt to be significant.     Karmen Bongo MD [facility]  If 7PM-7AM, please contact night-coverage [email or web address] Password [email or web address]  02/14/2016, 5:08 AM

## 2016-02-14 NOTE — ED Triage Notes (Signed)
Pt reports starting Augmentin yesterday for "fluid on ears"  And URI.  Today pt started vomiting dark coffee ground and has had multiple dark, tarry stools. Pt denies any hx of GI problems or bleeds.

## 2016-02-14 NOTE — Op Note (Signed)
[facility] Patient Name: Stacey Riley Procedure Date: 02/14/2016 2:32 PM MRN: [id number] Date of Birth: 02/01/1941 Attending MD: Norvel Richards , MD CSN: [id number] Age: 75 Admit Type: Inpatient Procedure:                Upper GI endoscopy Indications:              Coffee-ground emesis Providers:                Norvel Richards, MD, Lurline Del, RN, Purcell Nails.                            [place], Merchant [employer] officer Referring MD:              Medicines:                Midazolam 2 mg IV, Meperidine 50 mg IV, Ondansetron                            4 mg IV Complications:            No immediate complications. Estimated Blood Loss:     Estimated blood loss: none. Procedure:                Pre-Anesthesia Assessment:                           - Prior to the procedure, a History and Physical                            was performed, and patient medications and                            allergies were reviewed. The patient's tolerance of                            previous anesthesia was also reviewed. The risks                            and benefits of the procedure and the sedation                            options and risks were discussed with the patient.                            All questions were answered, and informed consent                            was obtained. Prior Anticoagulants: The patient has                            taken no previous anticoagulant or antiplatelet                            agents. ASA Grade Assessment: II - A patient with  mild systemic disease. After reviewing the risks                            and benefits, the patient was deemed in                            satisfactory condition to undergo the procedure.                           After obtaining informed consent, the endoscope was                            passed under direct vision. Throughout the                            procedure, the patient's blood  pressure, pulse, and                            oxygen saturations were monitored continuously. The                            [id number]) scope was introduced through the                            mouth, and advanced to the second part of duodenum.                            The upper GI endoscopy was accomplished without                            difficulty. The patient tolerated the procedure                            well. The patient tolerated the procedure well. Scope In: 2:45:15 PM Scope Out: 2:50:19 PM Total Procedure Duration: 0 hours 5 minutes 4 seconds  Findings:      The examined esophagus was normal.      A large hiatal hernia was present.      Localized severe mucosal changes characterized by linear erosions and       longitudinal markings were found in the stomach - Straddling the       diaphragmatic hiatus - One area of adherent clot but no active bleeding       [name] lesions). Remainder of gastric mucosa appeared normal. Patent       pylorus.      The duodenal bulb and second portion of the duodenum were normal. Impression:               - Normal esophagus.                           - Large hiatal hernia.                           - Linearly eroded and longitudinally marked mucosa  in the stomach [name] lesion) - likely explains                            bleeding.                           - Normal duodenal bulb and second portion of the                            duodenum.                           - No specimens collected. Moderate Sedation:      Moderate (conscious) sedation was administered by the endoscopy nurse       and supervised by the endoscopist. The following parameters were       monitored: oxygen saturation, heart rate, blood pressure, respiratory       rate, EKG, adequacy of pulmonary ventilation, and response to care.       Total physician intraservice time was 12 minutes. Recommendation:           - Return  patient to hospital ward for ongoing care.                           - Clear liquid diet.                           - Continue present medications. Carafate                            suspension4 times a day. Oral PPI therapy                           - No repeat upper endoscopy.                           - Return to GI office PRN. Procedure Code(s):        --- Professional ---                           [phone], Esophagogastroduodenoscopy, flexible,                            transoral; diagnostic, including collection of                            specimen(s) by brushing or washing, when performed                            (separate procedure)                           99152, Moderate sedation services provided by the                            same physician or other qualified health care  professional performing the diagnostic or                            therapeutic service that the sedation supports,                            requiring the presence of an independent trained                            observer to assist in the monitoring of the                            patient's level of consciousness and physiological                            status; initial 15 minutes of intraservice time,                            patient age 60 years or older Diagnosis Code(s):        --- Professional ---                           K44.9, Diaphragmatic hernia without obstruction or                            gangrene                           K31.89, Other diseases of stomach and duodenum                           K92.0, Hematemesis CPT copyright 2016 American Medical Association. All rights reserved. The codes documented in this report are preliminary and upon coder review may  be revised to meet current compliance requirements. Cristopher Estimable. Maimouna Rondeau, MD Norvel Richards, MD 02/14/2016 5:24:12 PM This report has been signed electronically. Number of Addenda: 0

## 2016-02-14 NOTE — Plan of Care (Addendum)
     Stacey Riley mother of (Stacey Riley [birth year] ) was admitted to the Hospital on 02/14/2016 Stacey Amy is with her at the hospital and should be excused from work/school on 02-14-16.  Call Lala Lund MD, [facility]  [phone] with questions.  Thurnell Lose M.D on 02/14/2016,at 8:34 AM  [facility]   Office  [phone]

## 2016-02-14 NOTE — Care Management Note (Signed)
Case Management Note  Patient Details  Name: Stacey Riley MRN: [id number] Date of Birth: 1940-09-13  Subjective/Objective:   Patient adm from home with UGIB. Ind with ADL's, lives alone. She still drives, goes to [facility] for primary care and has insurance  With drug coverage.                  Action/Plan: Anticipate DC home with self care.   Expected Discharge Date:     02/14/2016             Expected Discharge Plan:  Home/Self Care  In-House Referral:  NA  Discharge planning Services  CM Consult  Post Acute Care Choice:  NA Choice offered to:  NA  DME Arranged:    DME Agency:     HH Arranged:    HH Agency:     Status of Service:  In process, will continue to follow  If discussed at Long Length of Stay Meetings, dates discussed:    Additional Comments:  Elain Wixon, Chauncey Reading, RN 02/14/2016, 12:46 PM

## 2016-02-14 NOTE — Consult Note (Signed)
Referring Provider: Thurnell Lose, MD Primary Care Physician:  Cameron Proud, FNP Primary Gastroenterologist:  [name], MD  Reason for Consultation:  UGI bleed  HPI: Stacey Riley is a 75 y.o. female with past medical history significant for diet-controlled diabetes, hypertension, hyperlipidemia, bradycardia, GERD who presented with complaints of vomiting and diarrhea, coffee ground emesis/black watery stools. Symptoms began yesterday morning after taking Augmentin that was provided for her by PCP couple of days ago for URI symptoms. She had tolerated it the day before. Yesterday after breakfast and Augmentin she has nausea for several hours and this was followed by vomiting and diarrhea. Symptoms persisted all day and she presented to ER last night. 10+ episodes of vomiting/diarrhea. Complained of weakness and shortness of breath. Dizziness.   Hemoglobin on presentation was 10.4, MCV normal. BUN up at 32, creatinine 0.79. She was Hemoccult-positive. Today her hemoglobin is down to 9.6. No imaging studies performed.  At baseline she has GERD and takes omeprazole. No dysphagia. Some refractory heartburn at times.  Last BM 5:30. Last one still very dark but less. Watery stool. No abdominal pain. No brbpr. ASA 81mg  some days. No other NSAIDs.   Prior to Admission medications   Medication Sig Start Date End Date Taking? Authorizing Provider  guaiFENesin-codeine (ROBITUSSIN AC) 100-10 MG/5ML syrup Take 5 mLs by mouth 4 (four) times daily as needed for cough.   Yes Historical Provider, MD  albuterol (PROAIR HFA) 108 (90 BASE) MCG/ACT inhaler Inhale 2 puffs into the lungs every 6 (six) hours as needed for wheezing or shortness of breath.    Historical Provider, MD  amLODipine (NORVASC) 10 MG tablet Take 10 mg by mouth every morning.  04/22/11   Historical Provider, MD  aspirin 81 MG tablet Take 81 mg by mouth every morning.     Historical Provider, MD          hydrochlorothiazide  (HYDRODIURIL) 25 MG tablet Take 25 mg by mouth every morning.  04/22/11   Historical Provider, MD  lisinopril (PRINIVIL,ZESTRIL) 40 MG tablet Take 40 mg by mouth every morning.  04/22/11   Historical Provider, MD  nitroGLYCERIN (NITROSTAT) 0.4 MG SL tablet Place 1 tablet (0.4 mg total) under the tongue every 5 (five) minutes as needed for chest pain. 04/23/13   [name], MD  Omega-3 Fatty Acids (FISH OIL) 1000 MG CAPS Take 1 capsule by mouth 2 (two) times daily.    Historical Provider, MD  omeprazole (PRILOSEC) 20 MG capsule Take 20 mg by mouth every morning.    Historical Provider, MD  pravastatin (PRAVACHOL) 20 MG tablet Take 20 mg by mouth at bedtime.    Historical Provider, MD    Current Facility-Administered Medications  Medication Dose Route Frequency Provider Last Rate Last Dose  . acetaminophen (TYLENOL) tablet 650 mg  650 mg Oral Q6H PRN Karmen Bongo, MD       Or  . acetaminophen (TYLENOL) suppository 650 mg  650 mg Rectal Q6H PRN Karmen Bongo, MD      . albuterol (PROVENTIL) (2.5 MG/3ML) 0.083% nebulizer solution 3 mL  3 mL Inhalation Q6H PRN Karmen Bongo, MD      . amLODipine (NORVASC) tablet 10 mg  10 mg Oral q morning - 10a Karmen Bongo, MD      . guaiFENesin-codeine 100-10 MG/5ML solution 5 mL  5 mL Oral QID PRN Karmen Bongo, MD      . hydrochlorothiazide (HYDRODIURIL) tablet 25 mg  25 mg Oral q morning - 10a  Karmen Bongo, MD      . lactated ringers infusion   Intravenous Continuous Karmen Bongo, MD 125 mL/hr at 02/14/16 0515    . lisinopril (PRINIVIL,ZESTRIL) tablet 40 mg  40 mg Oral q morning - 10a Karmen Bongo, MD      . loratadine (CLARITIN) tablet 10 mg  10 mg Oral Daily Karmen Bongo, MD      . ondansetron [facility]) tablet 4 mg  4 mg Oral Q6H PRN Karmen Bongo, MD       Or  . ondansetron [facility]) injection 4 mg  4 mg Intravenous Q6H PRN Karmen Bongo, MD      . pantoprazole (PROTONIX) 80 mg in sodium chloride 0.9 % 250 mL (0.32 mg/mL) infusion  8 mg/hr  Intravenous Continuous Virgel Manifold, MD 25 mL/hr at 02/14/16 0406 8 mg/hr at 02/14/16 0406  . pravastatin (PRAVACHOL) tablet 20 mg  20 mg Oral QHS Karmen Bongo, MD        Allergies as of 02/14/2016 - Review Complete 02/14/2016  Allergen Reaction Noted  . Augmentin [amoxicillin-pot clavulanate] Nausea And Vomiting 02/14/2016  . Sulfa antibiotics Swelling 07/14/2011    Past Medical History:  Diagnosis Date  . Arthritis   . Asthma   . Bradycardia   . DM (diabetes mellitus) ([place])   . GERD (gastroesophageal reflux disease)   . High cholesterol   . HTN (hypertension)     Past Surgical History:  Procedure Laterality Date  . CHOLECYSTECTOMY  1990s  . COLONOSCOPY  03/18/1999   Dr Rourk-int  hemorrhoids, pancolonic diverticula  . COLONOSCOPY WITH ESOPHAGOGASTRODUODENOSCOPY (EGD)  08/06/2011   Rourk: Schatzki ring, moderate sized hiatal hernia status post dilation for history of dysphagia. Pancolonic diverticula, single diminutive polyp at the base of the cecum removed (tubular adenoma). Next colonoscopy recommended for April 2018  . ESOPHAGOGASTRODUODENOSCOPY  07/02/01   Dr Rourk-Schatzki's ring s/p 33F maloney dilation, otherwise normal/small hiatal hernia/   Linear erosion and ulceration in the proximal stomach/  The ulcerated lesion in the proximal stomach  benign.This may be a [name] lesion secondary to trauma to the mucosa, straddling the  diaphragmatic hiatus in the presence of a hiatal hernia   . MALONEY DILATION  08/06/2011   Procedure: Venia Minks DILATION;  Surgeon: Daneil Dolin, MD;  Location: [facility];  Service: Endoscopy;  Laterality: N/A;  . TUBAL LIGATION      Family History  Problem Relation Age of Onset  . Aneurysm Daughter   . Stroke Daughter   . Colon cancer Neg Hx   . Inflammatory bowel disease Neg Hx     Social History   Social History  . Marital status: Divorced    Spouse name: N/A  . Number of children: 5  . Years of education: N/A   Occupational  History  . retired; Char[employer] fundraiser    Social History Main Topics  . Smoking status: Never Smoker  . Smokeless tobacco: Never Used  . Alcohol use No  . Drug use: No  . Sexual activity: Not on file   Other Topics Concern  . Not on file   Social History Narrative   Lives alone     ROS:  General: Negative for anorexia, weight loss, fever, chills. + fatigue, weakness. Eyes: Negative for vision changes.  ENT: Negative for hoarseness, difficulty swallowing , nasal congestion. CV: Negative for chest pain, angina, palpitations, dyspnea on exertion, peripheral edema.  Respiratory: Negative for dyspnea at rest,   cough, sputum, +DOE and +wheezing.  GI: See  history of present illness. GU:  Negative for dysuria, hematuria, urinary incontinence, urinary frequency, nocturnal urination.  MS: Negative for joint pain, low back pain.  Derm: Negative for rash or itching.  Neuro: Negative for weakness, abnormal sensation, seizure, frequent headaches, memory loss, confusion.  Psych: Negative for anxiety, depression, suicidal ideation, hallucinations.  Endo: Negative for unusual weight change.  Heme: Negative for bruising or bleeding. Allergy: Negative for rash or hives.       Physical Examination: Vital signs in last 24 hours: Temp:  [97.7 F (36.5 C)] 97.7 F (36.5 C) (11/02 0511) Pulse Rate:  [82-106] 106 (11/02 0511) Resp:  [18-19] 18 (11/02 0511) BP: (104-132)/(46-60) 132/46 (11/02 0511) SpO2:  [98 %-100 %] 100 % (11/02 0511) Weight:  [169 lb 3.2 oz (76.7 kg)-172 lb (78 kg)] 169 lb 3.2 oz (76.7 kg) (11/02 0500)    General: Well-nourished, well-developed in no acute distress. Son and daughter at bedside.  Head: Normocephalic, atraumatic.   Eyes: Conjunctiva pink, no icterus. Mouth: Oropharyngeal mucosa moist and pink , no lesions erythema or exudate. Neck: Supple without thyromegaly, masses, or lymphadenopathy.  Lungs: scattered rhonchi.  Heart: Regular rate and rhythm, no murmurs rubs or  gallops.  Abdomen: Bowel sounds are normal, nontender, nondistended, no hepatosplenomegaly or masses, no abdominal bruits or    hernia , no rebound or guarding.   Rectal: not performed Extremities: No lower extremity edema, clubbing, deformity.  Neuro: Alert and oriented x 4 , grossly normal neurologically.  Skin: Warm and dry, no rash or jaundice.   Psych: Alert and cooperative, normal mood and affect.        Intake/Output from previous day: No intake/output data recorded. Intake/Output this shift: No intake/output data recorded.  Lab Results: CBC  Recent Labs  02/14/16 0235 02/14/16 0613  WBC 8.0 7.1  HGB 10.4* 9.6*  HCT 32.0* 30.0*  MCV 83.6 84.0  PLT 170 162   BMET  Recent Labs  02/14/16 0235 02/14/16 0613  NA 135 137  K 3.5 4.0  CL 100* 104  CO2 27 28  GLUCOSE 192* 137*  BUN 32* 31*  CREATININE 0.79 0.65  CALCIUM 8.5* 8.0*   LFT No results for input(s): BILITOT, BILIDIR, IBILI, ALKPHOS, AST, ALT, PROT, ALBUMIN in the last 72 hours.  Lipase  Recent Labs  02/14/16 0235  LIPASE 15    PT/INR No results for input(s): LABPROT, INR in the last 72 hours.    Imaging Studies: No results found.Minnie.Brome week]   Impression: 75 y/o female presenting with acute onset N/V/D in setting of recent illness and starting augmentin two days ago. She reports coffee ground emesis and black tarry stools. She presented with Hgb 10.4 with mild drop likely hemodilution. Last previous Hgb was from 2 years ago and was normal. BUN elevated. Heme + stool. Suspect UGI bleed. Suspect M-W tear.   Plan: 1. PPI. Could transition to IV BID. 2. Consider EGD today.   We would like to thank you for the opportunity to participate in the care of [employer].  Laureen Ochs. Bernarda Caffey [facility] [phone] 11/2/20178:57 AM     LOS: 0 days

## 2016-02-14 NOTE — Progress Notes (Signed)
PROGRESS NOTE                                                                                                                                                                                                             Patient Demographics:    Stacey Riley, is a 75 y.o. female, DOB - 1940-09-11, [id number]  Admit date - 02/14/2016   Admitting Physician Karmen Bongo, MD  Outpatient Primary MD for the patient is Cameron Proud, FNP  LOS - 0  Chief Complaint  Patient presents with  . Hematemesis       Brief Narrative  Stacey Riley is a 75 y.o. female with medical history significant of DM (diet controlled), HTN, HLD, and GERD presenting with upper GI bleeding.  The patient had an episode of vomiting on Saturday and then had an "asthma attack" for which she was seen at her PCP's office on Tuesday.  She was diagnosed as having fluid on her ears and given Augmentin (as well as Robitussin AC and Albuterol).  Did okay with it on Tuesday but starting around noon on Wednesday she started having vomiting and diarrhea.  Estimates 10+ episodes of vomiting and diarrhea.  Does not remember every taking Augmentin in the past.  With the onset of vomiting, also started with hematemesis - dark brown/black in color.       Subjective:    Stacey Riley today has, No headache, No chest pain, No abdominal pain - No Nausea, No new weakness tingling or numbness, No Cough - Mild SOB.     Assessment  & Plan :     1.Acute upper GI bleed with mild acute upper GI bleed related acute anemia. H&H stable not requiring transfusion, history suggests patient might have developed a Mallory-Weiss tear due to nausea vomiting, continue IV PPI, continue monitoring H&H, GI has seen the patient likely EGD dated today.  2. GERD. On PPI  3. Hypertension. Continue Norvasc blood pressure is stable. Is on hold due to possible renal injury in the light of  acute drop in H&H.  4. Dyslipidemia on statin.  5. Bronchitis diagnosed outpatient. Stable here. Some ear fullness, agree with N which will be continued. Hold antibiotics for now.  6. Hyperglycemia on admission. Pending A1c. SSI for now.  No results found for: HGBA1C  CBG (last 3)  No results for input(s): GLUCAP in the last 72 hours.     Family Communication  :  None  Code Status :  Full  Diet : NPO  Disposition Plan  :  Home in am  Consults  :  GI  Procedures  :    EGD -   DVT Prophylaxis  :    SCDs    Lab Results  Component Value Date   PLT 162 02/14/2016    Inpatient Medications  Scheduled Meds: . amLODipine  10 mg Oral q morning - 10a  . hydrochlorothiazide  25 mg Oral q morning - 10a  . loratadine  10 mg Oral Daily  . pravastatin  20 mg Oral QHS   Continuous Infusions: . lactated ringers 500 mL (02/14/16 0815)  . pantoprozole (PROTONIX) infusion 8 mg/hr (02/14/16 0406)   PRN Meds:.acetaminophen **OR** acetaminophen, albuterol, guaiFENesin-codeine, [DISCONTINUED] ondansetron **OR** ondansetron (ZOFRAN) IV  Antibiotics  :    Anti-infectives    None         Objective:   Vitals:   02/14/16 0400 02/14/16 0430 02/14/16 0500 02/14/16 0511  BP: 104/59 119/60  (!) 132/46  Pulse: 84 82  (!) 106  Resp: 18 19  18   Temp:    97.7 F (36.5 C)  TempSrc:    Oral  SpO2: 99% 98%  100%  Weight:   76.7 kg (169 lb 3.2 oz)   Height:   5\' 4"  (1.626 m)     Wt Readings from Last 3 Encounters:  02/14/16 76.7 kg (169 lb 3.2 oz)  07/14/11 80.9 kg (178 lb 6.4 oz)     Intake/Output Summary (Last 24 hours) at 02/14/16 0909 Last data filed at 02/14/16 0807  Gross per 24 hour  Intake                0 ml  Output              250 ml  Net             -250 ml     Physical Exam  Awake Alert, Oriented X 3, No new F.N deficits, Normal affect [place].AT,PERRAL Supple Neck,No JVD, No cervical lymphadenopathy appriciated.  Symmetrical Chest wall movement, Good air  movement bilaterally, CTAB RRR,No Gallops,Rubs or new Murmurs, No Parasternal Heave +ve B.Sounds, Abd Soft, No tenderness, No organomegaly appriciated, No rebound - guarding or rigidity. No Cyanosis, Clubbing or edema, No new Rash or bruise      Data Review:    CBC  Recent Labs Lab 02/14/16 0235 02/14/16 0613  WBC 8.0 7.1  HGB 10.4* 9.6*  HCT 32.0* 30.0*  PLT 170 162  MCV 83.6 84.0  MCH 27.2 26.9  MCHC 32.5 32.0  RDW 14.7 14.8  LYMPHSABS 2.0  --   MONOABS 0.5  --   EOSABS 0.0  --   BASOSABS 0.0  --     Chemistries   Recent Labs Lab 02/14/16 0235 02/14/16 0613  NA 135 137  K 3.5 4.0  CL 100* 104  CO2 27 28  GLUCOSE 192* 137*  BUN 32* 31*  CREATININE 0.79 0.65  CALCIUM 8.5* 8.0*   ------------------------------------------------------------------------------------------------------------------ No results for input(s): CHOL, HDL, LDLCALC, TRIG, CHOLHDL, LDLDIRECT in the last 72 hours.  No results found for: HGBA1C ------------------------------------------------------------------------------------------------------------------ No results for input(s): TSH, T4TOTAL, T3FREE, THYROIDAB in the last 72 hours.  Invalid input(s): FREET3 ------------------------------------------------------------------------------------------------------------------ No results for input(s): VITAMINB12, FOLATE, FERRITIN, TIBC, IRON, RETICCTPCT in the last 72 hours.  Coagulation profile No results for input(s): INR, PROTIME in the last 168 hours.  No results for input(s): DDIMER in the last 72 hours.  Cardiac Enzymes No results for input(s): CKMB, TROPONINI, MYOGLOBIN in the last 168 hours.  Invalid input(s): CK ------------------------------------------------------------------------------------------------------------------ No results found for: BNP  Micro Results No results found for this or any previous visit (from the past 240 hour(s)).  Radiology Reports No results  found.  Time Spent in minutes  30   Lala Lund K M.D on 02/14/2016 at 9:09 AM  Between 7am to 7pm - Pager - [phone]  After 7pm go to [email or web address] - password [facility]  [facility] -  Office  [phone]

## 2016-02-15 DIAGNOSIS — K922 Gastrointestinal hemorrhage, unspecified: Secondary | ICD-10-CM | POA: Diagnosis not present

## 2016-02-15 LAB — GLUCOSE, CAPILLARY
Glucose-Capillary: 107 mg/dL — ABNORMAL HIGH (ref 65–99)
Glucose-Capillary: 114 mg/dL — ABNORMAL HIGH (ref 65–99)

## 2016-02-15 LAB — CBC
HCT: 26.1 % — ABNORMAL LOW (ref 36.0–46.0)
Hemoglobin: 8.3 g/dL — ABNORMAL LOW (ref 12.0–15.0)
MCH: 26.9 pg (ref 26.0–34.0)
MCHC: 31.8 g/dL (ref 30.0–36.0)
MCV: 84.5 fL (ref 78.0–100.0)
PLATELETS: 177 10*3/uL (ref 150–400)
RBC: 3.09 MIL/uL — AB (ref 3.87–5.11)
RDW: 15.2 % (ref 11.5–15.5)
WBC: 5.4 10*3/uL (ref 4.0–10.5)

## 2016-02-15 LAB — HEMOGLOBIN A1C
Hgb A1c MFr Bld: 6.5 % — ABNORMAL HIGH (ref 4.8–5.6)
MEAN PLASMA GLUCOSE: 140 mg/dL

## 2016-02-15 MED ORDER — LORATADINE 10 MG PO TABS: 10.0000 mg | ORAL_TABLET | Freq: Every day | ORAL | 0 refills | Status: AC

## 2016-02-15 MED ORDER — PANTOPRAZOLE SODIUM 40 MG PO TBEC: 40.0000 mg | DELAYED_RELEASE_TABLET | Freq: Two times a day (BID) | ORAL | 0 refills | Status: DC

## 2016-02-15 MED ORDER — SUCRALFATE 1 GM/10ML PO SUSP: 1.0000 g | Freq: Three times a day (TID) | ORAL | 0 refills | Status: DC

## 2016-02-15 NOTE — Discharge Instructions (Signed)
Follow with Primary MD Stacey Proud, FNP in 7 days   Get CBC, CMP, 2 view Chest X ray checked  by Primary MD or SNF MD in 5-7 days    Activity: As tolerated with Full fall precautions use walker/cane & assistance as needed   Disposition Home     Diet:   Heart Healthy    For Heart failure patients - Check your Weight same time everyday, if you gain over 2 pounds, or you develop in leg swelling, experience more shortness of breath or chest pain, call your Primary MD immediately. Follow Cardiac Low Salt Diet and 1.5 lit/day fluid restriction.   On your next visit with your primary care physician please Get Medicines reviewed and adjusted.   Please request your Prim.MD to go over all Hospital Tests and Procedure/Radiological results at the follow up, please get all Hospital records sent to your Prim MD by signing hospital release before you go home.   If you experience worsening of your admission symptoms, develop shortness of breath, life threatening emergency, suicidal or homicidal thoughts you must seek medical attention immediately by calling 911 or calling your MD immediately  if symptoms less severe.  You Must read complete instructions/literature along with all the possible adverse reactions/side effects for all the Medicines you take and that have been prescribed to you. Take any new Medicines after you have completely understood and accpet all the possible adverse reactions/side effects.   Do not drive, operate heavy machinery, perform activities at heights, swimming or participation in water activities or provide baby sitting services if your were admitted for syncope or siezures until you have seen by Primary MD or a Neurologist and advised to do so again.  Do not drive when taking Pain medications.    Do not take more than prescribed Pain, Sleep and Anxiety Medications  Special Instructions: If you have smoked or chewed Tobacco  in the last 2 yrs please stop smoking, stop  any regular Alcohol  and or any Recreational drug use.  Wear Seat belts while driving.   Please note  You were cared for by a hospitalist during your hospital stay. If you have any questions about your discharge medications or the care you received while you were in the hospital after you are discharged, you can call the unit and asked to speak with the hospitalist on call if the hospitalist that took care of you is not available. Once you are discharged, your primary care physician will handle any further medical issues. Please note that NO REFILLS for any discharge medications will be authorized once you are discharged, as it is imperative that you return to your primary care physician (or establish a relationship with a primary care physician if you do not have one) for your aftercare needs so that they can reassess your need for medications and monitor your lab values.

## 2016-02-15 NOTE — Progress Notes (Signed)
Patient with orders to be discharge home. Discharge instructions given, patient verbalized understanding. Prescriptions given. Patient stable. Patient left in private vehicle with son.  

## 2016-02-15 NOTE — Discharge Summary (Addendum)
Stacey Riley [id number] DOB: 09/15/1940 DOA: 02/14/2016  PCP: Cameron Proud, FNP  Admit date: 02/14/2016  Discharge date: 02/15/2016  Admitted From: Home   Disposition:  Home   Recommendations for Outpatient Follow-up:   Follow up with PCP in 1-2 weeks  PCP Please obtain BMP/CBC, 2 view CXR in 1week,  (see Discharge instructions)   PCP Please follow up on the following pending results: None   Home Health: None   Equipment/Devices: None  Consultations: GI Discharge Condition: Stable   CODE STATUS: Full   Diet Recommendation: Heart Healthy    Chief Complaint  Patient presents with  . Hematemesis     Brief history of present illness from the day of admission and additional interim summary    Stacey N Mitchellis a 75 y.o.femalewith medical history significant of DM (diet controlled), HTN, HLD, and GERD presenting with upper GI bleeding. The patient had an episode of vomiting on Saturday and then had an "asthma attack" for which she was seen at her PCP's office on Tuesday. Shewas diagnosed as having fluid on her ears and given Augmentin (as well as Robitussin AC and Albuterol). Did okay with it on Tuesday but starting around noon on Wednesday she started having vomiting and diarrhea. Estimates 10+ episodes of vomiting and diarrhea. Does not remember every taking Augmentin in the past. With the onset of vomiting, also started with hematemesis - dark brown/black in color.   Hospital issues addressed    1.Acute upper GI bleed with mild acute upper GI bleed related acute anemia. H&H stable not requiring transfusion, Patient was seen by GI underwent EGD showing large hiatal hernia and hernia induced Stacey Riley lesion, she will be placed on PPI twice a day along with Carafate with outpatient PCP and GI  follow-up, she is today having breakfast symptom free and will be discharged home.  2. GERD. On PPI  3. Hypertension. Continue home medications with follow with PCP.  4. Dyslipidemia on statin.  5. Bronchitis diagnosed outpatient. Stable here. Some ear fullness, agree with claritin which will be continued. Hold antibiotics for now.  6. Hyperglycemia on admission. A1c suggests pre diabetes, PCP to monitor.  Lab Results  Component Value Date   HGBA1C 6.5 (H) 02/14/2016      Discharge diagnosis     Principal Problem:   UGIB (upper gastrointestinal bleed) Active Problems:   GERD (gastroesophageal reflux disease)   Diabetes ([place])   Essential hypertension   Hyperlipidemia   Acute bronchitis    Discharge instructions    Discharge Instructions    Discharge instructions    Complete by:  As directed    Follow with Primary MD Cameron Proud, FNP in 7 days   Get CBC, CMP, 2 view Chest X ray checked  by Primary MD or SNF MD in 5-7 days    Activity: As tolerated with Full fall precautions use walker/cane & assistance as needed   Disposition Home     Diet:   Heart Healthy    For Heart failure  patients - Check your Weight same time everyday, if you gain over 2 pounds, or you develop in leg swelling, experience more shortness of breath or chest pain, call your Primary MD immediately. Follow Cardiac Low Salt Diet and 1.5 lit/day fluid restriction.   On your next visit with your primary care physician please Get Medicines reviewed and adjusted.   Please request your Prim.MD to go over all Hospital Tests and Procedure/Radiological results at the follow up, please get all Hospital records sent to your Prim MD by signing hospital release before you go home.   If you experience worsening of your admission symptoms, develop shortness of breath, life threatening emergency, suicidal or homicidal thoughts you must seek medical attention immediately by calling 911 or calling  your MD immediately  if symptoms less severe.  You Must read complete instructions/literature along with all the possible adverse reactions/side effects for all the Medicines you take and that have been prescribed to you. Take any new Medicines after you have completely understood and accpet all the possible adverse reactions/side effects.   Do not drive, operate heavy machinery, perform activities at heights, swimming or participation in water activities or provide baby sitting services if your were admitted for syncope or siezures until you have seen by Primary MD or a Neurologist and advised to do so again.  Do not drive when taking Pain medications.    Do not take more than prescribed Pain, Sleep and Anxiety Medications  Special Instructions: If you have smoked or chewed Tobacco  in the last 2 yrs please stop smoking, stop any regular Alcohol  and or any Recreational drug use.  Wear Seat belts while driving.   Please note  You were cared for by a hospitalist during your hospital stay. If you have any questions about your discharge medications or the care you received while you were in the hospital after you are discharged, you can call the unit and asked to speak with the hospitalist on call if the hospitalist that took care of you is not available. Once you are discharged, your primary care physician will handle any further medical issues. Please note that NO REFILLS for any discharge medications will be authorized once you are discharged, as it is imperative that you return to your primary care physician (or establish a relationship with a primary care physician if you do not have one) for your aftercare needs so that they can reassess your need for medications and monitor your lab values.   Increase activity slowly    Complete by:  As directed       Discharge Medications     Medication List    STOP taking these medications   dexlansoprazole 60 MG capsule Commonly known as:   DEXILANT   omeprazole 20 MG capsule Commonly known as:  PRILOSEC     TAKE these medications   amLODipine 10 MG tablet Commonly known as:  NORVASC Take 10 mg by mouth every morning.   aspirin 81 MG tablet Take 81 mg by mouth every morning.   hydrochlorothiazide 25 MG tablet Commonly known as:  HYDRODIURIL Take 25 mg by mouth every morning.   lisinopril 40 MG tablet Commonly known as:  PRINIVIL,ZESTRIL Take 40 mg by mouth every morning.   loratadine 10 MG tablet Commonly known as:  CLARITIN Take 1 tablet (10 mg total) by mouth daily. Start taking on:  02/16/2016   pantoprazole 40 MG tablet Commonly known as:  PROTONIX Take 1 tablet (40 mg total)  by mouth 2 (two) times daily before a meal.   pravastatin 20 MG tablet Commonly known as:  PRAVACHOL Take 20 mg by mouth at bedtime.   PROAIR HFA 108 (90 Base) MCG/ACT inhaler Generic drug:  albuterol Inhale 2 puffs into the lungs every 6 (six) hours as needed for wheezing or shortness of breath.   sucralfate 1 GM/10ML suspension Commonly known as:  CARAFATE Take 10 mLs (1 g total) by mouth 4 (four) times daily -  with meals and at bedtime.       Follow-up Information    Cameron Proud, FNP. Schedule an appointment as soon as possible for a visit in 1 week(s).   Specialty:  Nurse Practitioner Contact information: [address] [place] [address] [phone]        Manus Rudd, MD .   Specialty:  Gastroenterology Contact information: [address] [place] [address] [phone]           Major procedures and Radiology Reports - PLEASE review detailed and final reports thoroughly  -     EGD - Hiatal Hernia, Stacey Riley lesion   No results found.  Micro Results    No results found for this or any previous visit (from the past 240 hour(s)).  Today   Subjective    Stacey Riley today has no headache,no chest abdominal pain,no new weakness tingling or numbness, feels much better wants to  go home today.    Objective   Blood pressure (!) 104/40, pulse (!) 52, temperature 97.5 F (36.4 C), temperature source Oral, resp. rate 18, height 5\' 4"  (1.626 m), weight 76.7 kg (169 lb 3.2 oz), SpO2 99 %.   Intake/Output Summary (Last 24 hours) at 02/15/16 0957 Last data filed at 02/15/16 0900  Gross per 24 hour  Intake             1600 ml  Output              600 ml  Net             1000 ml    Exam Awake Alert, Oriented x 3, No new F.N deficits, Normal affect [place].AT,PERRAL Supple Neck,No JVD, No cervical lymphadenopathy appriciated.  Symmetrical Chest wall movement, Good air movement bilaterally, CTAB RRR,No Gallops,Rubs or new Murmurs, No Parasternal Heave +ve B.Sounds, Abd Soft, Non tender, No organomegaly appriciated, No rebound -guarding or rigidity. No Cyanosis, Clubbing or edema, No new Rash or bruise   Data Review   CBC w Diff:  Lab Results  Component Value Date   WBC 5.4 02/15/2016   HGB 8.3 (L) 02/15/2016   HCT 26.1 (L) 02/15/2016   PLT 177 02/15/2016   LYMPHOPCT 25 02/14/2016   MONOPCT 6 02/14/2016   EOSPCT 0 02/14/2016   BASOPCT 0 02/14/2016    CMP:  Lab Results  Component Value Date   NA 137 02/14/2016   K 4.0 02/14/2016   CL 104 02/14/2016   CO2 28 02/14/2016   BUN 31 (H) 02/14/2016   CREATININE 0.65 02/14/2016  .   Total Time in preparing paper work, data evaluation and todays exam - 35 minutes  Thurnell Lose M.D on 02/15/2016 at 9:57 AM  [facility]   Office  [phone]

## 2016-02-18 NOTE — ED Provider Notes (Signed)
[facility] DEPT Provider Note   CSN: [id number] Arrival date & time: 02/14/16  0148     History   Chief Complaint Chief Complaint  Patient presents with  . Hematemesis    HPI Stacey Riley is a 75 y.o. female.  HPI  47 rolled female with coffee-ground emesis/hematemesis. Subsequently developing diarrhea which appeared dark in color. Of note, he was diagnosed with "an asthma attack" on Tuesday and started on Augmentin for this as well as albuterol and Robitussin. On Wednesday and having the vomiting diarrhea. Multiple episodes of each. Denies any similar abdominal pain though. No dizziness or lightheadedness. No fevers or chills.  Past Medical History:  Diagnosis Date  . Arthritis   . Asthma   . Bradycardia   . DM (diabetes mellitus) ([place])   . GERD (gastroesophageal reflux disease)   . High cholesterol   . HTN (hypertension)     Patient Active Problem List   Diagnosis Date Noted  . UGIB (upper gastrointestinal bleed) 02/14/2016  . Diabetes ([place]) 02/14/2016  . Essential hypertension 02/14/2016  . Hyperlipidemia 02/14/2016  . Acute bronchitis 02/14/2016  . GERD (gastroesophageal reflux disease) 07/14/2011  . Screen for colon cancer 07/14/2011  . Dysphagia 07/14/2011    Past Surgical History:  Procedure Laterality Date  . CHOLECYSTECTOMY  1990s  . COLONOSCOPY  03/18/1999   Dr Rourk-int  hemorrhoids, pancolonic diverticula  . COLONOSCOPY WITH ESOPHAGOGASTRODUODENOSCOPY (EGD)  08/06/2011   Rourk: Schatzki ring, moderate sized hiatal hernia status post dilation for history of dysphagia. Pancolonic diverticula, single diminutive polyp at the base of the cecum removed (tubular adenoma). Next colonoscopy recommended for April 2018  . ESOPHAGOGASTRODUODENOSCOPY  07/02/01   Dr Rourk-Schatzki's ring s/p 33F maloney dilation, otherwise normal/small hiatal hernia/   Linear erosion and ulceration in the proximal stomach/  The ulcerated lesion in the proximal stomach  benign.This  may be a [name] lesion secondary to trauma to the mucosa, straddling the  diaphragmatic hiatus in the presence of a hiatal hernia   . MALONEY DILATION  08/06/2011   Procedure: Venia Minks DILATION;  Surgeon: Daneil Dolin, MD;  Location: [facility]ITE;  Service: Endoscopy;  Laterality: N/A;  . TUBAL LIGATION      OB History    No data available       Home Medications    Prior to Admission medications   Medication Sig Start Date End Date Taking? Authorizing Provider  albuterol (PROAIR HFA) 108 (90 BASE) MCG/ACT inhaler Inhale 2 puffs into the lungs every 6 (six) hours as needed for wheezing or shortness of breath.   Yes Historical Provider, MD  amLODipine (NORVASC) 10 MG tablet Take 10 mg by mouth every morning.  04/22/11  Yes Historical Provider, MD  aspirin 81 MG tablet Take 81 mg by mouth every morning.    Yes Historical Provider, MD  hydrochlorothiazide (HYDRODIURIL) 25 MG tablet Take 25 mg by mouth every morning.  04/22/11  Yes Historical Provider, MD  lisinopril (PRINIVIL,ZESTRIL) 40 MG tablet Take 40 mg by mouth every morning.  04/22/11  Yes Historical Provider, MD  pravastatin (PRAVACHOL) 20 MG tablet Take 20 mg by mouth at bedtime.   Yes Historical Provider, MD  loratadine (CLARITIN) 10 MG tablet Take 1 tablet (10 mg total) by mouth daily. 02/16/16   Thurnell Lose, MD  pantoprazole (PROTONIX) 40 MG tablet Take 1 tablet (40 mg total) by mouth 2 (two) times daily before a meal. 02/15/16   Thurnell Lose, MD  sucralfate (CARAFATE) 1  GM/10ML suspension Take 10 mLs (1 g total) by mouth 4 (four) times daily -  with meals and at bedtime. 02/15/16   Thurnell Lose, MD    Family History Family History  Problem Relation Age of Onset  . Aneurysm Daughter   . Stroke Daughter   . Hypertension Mother   . Dementia Mother   . Hypertension Father   . Kidney cancer Brother   . Cancer Brother   . Cancer Brother   . Colon cancer Neg Hx   . Inflammatory bowel disease Neg Hx     Social  History Social History  Substance Use Topics  . Smoking status: Never Smoker  . Smokeless tobacco: Never Used  . Alcohol use No     Allergies   Augmentin [amoxicillin-pot clavulanate] and Sulfa antibiotics   Review of Systems Review of Systems  All systems reviewed and negative, other than as noted in HPI.   Physical Exam Updated Vital Signs BP (!) 104/40 (BP Location: Right Arm)   Pulse (!) 52   Temp 97.5 F (36.4 C) (Oral)   Resp 18   Ht 5\' 4"  (1.626 m)   Wt 169 lb 3.2 oz (76.7 kg)   SpO2 99%   BMI 29.04 kg/m   Physical Exam  Constitutional: She appears well-developed and well-nourished. No distress.  HENT:  Head: Normocephalic and atraumatic.  Eyes: Conjunctivae are normal. Right eye exhibits no discharge. Left eye exhibits no discharge.  Neck: Neck supple.  Cardiovascular: Normal rate, regular rhythm and normal heart sounds.  Exam reveals no gallop and no friction rub.   No murmur heard. Pulmonary/Chest: Effort normal and breath sounds normal. No respiratory distress.  Abdominal: Soft. She exhibits no distension. There is no tenderness.  Musculoskeletal: She exhibits no edema or tenderness.  Neurological: She is alert.  Skin: Skin is warm and dry.  Psychiatric: She has a normal mood and affect. Her behavior is normal. Thought content normal.  Nursing note and vitals reviewed.    ED Treatments / Results  Labs (all labs ordered are listed, but only abnormal results are displayed) Labs Reviewed  CBC WITH DIFFERENTIAL/PLATELET - Abnormal; Notable for the following:       Result Value   RBC 3.83 (*)    Hemoglobin 10.4 (*)    HCT 32.0 (*)    All other components within normal limits  BASIC METABOLIC PANEL - Abnormal; Notable for the following:    Chloride 100 (*)    Glucose, Bld 192 (*)    BUN 32 (*)    Calcium 8.5 (*)    All other components within normal limits  BASIC METABOLIC PANEL - Abnormal; Notable for the following:    Glucose, Bld 137 (*)     BUN 31 (*)    Calcium 8.0 (*)    All other components within normal limits  CBC - Abnormal; Notable for the following:    RBC 3.57 (*)    Hemoglobin 9.6 (*)    HCT 30.0 (*)    All other components within normal limits  HEMOGLOBIN A1C - Abnormal; Notable for the following:    Hgb A1c MFr Bld 6.5 (*)    All other components within normal limits  GLUCOSE, CAPILLARY - Abnormal; Notable for the following:    Glucose-Capillary 107 (*)    All other components within normal limits  CBC - Abnormal; Notable for the following:    RBC 3.09 (*)    Hemoglobin 8.3 (*)    HCT  26.1 (*)    All other components within normal limits  GLUCOSE, CAPILLARY - Abnormal; Notable for the following:    Glucose-Capillary 107 (*)    All other components within normal limits  GLUCOSE, CAPILLARY - Abnormal; Notable for the following:    Glucose-Capillary 114 (*)    All other components within normal limits  POC OCCULT BLOOD, ED - Abnormal; Notable for the following:    Fecal Occult Bld POSITIVE (*)    All other components within normal limits  LIPASE, BLOOD  GLUCOSE, CAPILLARY  GLUCOSE, CAPILLARY  GLUCOSE, CAPILLARY  TYPE AND SCREEN    EKG  EKG Interpretation None       Radiology No results found.  Procedures Procedures (including critical care time)  Medications Ordered in ED Medications  lactated ringers infusion (5,000 mLs Intravenous New Bag/Given 02/14/16 0921)  sodium chloride flush (NS) 0.9 % injection (not administered)  ondansetron (ZOFRAN) 4 MG/2ML injection (not administered)  meperidine (DEMEROL) 100 MG/ML injection (not administered)  midazolam (VERSED) 5 MG/5ML injection (not administered)  lidocaine (XYLOCAINE) 2 % viscous mouth solution (not administered)  sodium chloride 0.9 % bolus 1,000 mL (0 mLs Intravenous Stopped 02/14/16 0350)  ondansetron (ZOFRAN) injection 4 mg (4 mg Intravenous Given 02/14/16 0254)  pantoprazole (PROTONIX) 80 mg in sodium chloride 0.9 % 100 mL IVPB (0  mg Intravenous Stopped 02/14/16 0350)  furosemide (LASIX) injection 40 mg (40 mg Intravenous Given 02/14/16 0844)     Initial Impression / Assessment and Plan / ED Course  I have reviewed the triage vital signs and the nursing notes.  Pertinent labs & imaging results that were available during my care of the patient were reviewed by me and considered in my medical decision making (see chart for details).  Clinical Course     75 year old female with symptoms consistent with an upper GI bleed. Protonix. Admission for observation and possible GI consultation.  Final Clinical Impressions(s) / ED Diagnoses   Final diagnoses:  UGIB (upper gastrointestinal bleed)    New Prescriptions Discharge Medication List as of 02/15/2016 10:12 AM    START taking these medications   Details  loratadine (CLARITIN) 10 MG tablet Take 1 tablet (10 mg total) by mouth daily., Starting Sat 02/16/2016, Normal    pantoprazole (PROTONIX) 40 MG tablet Take 1 tablet (40 mg total) by mouth 2 (two) times daily before a meal., Starting Fri 02/15/2016, Print    sucralfate (CARAFATE) 1 GM/10ML suspension Take 10 mLs (1 g total) by mouth 4 (four) times daily -  with meals and at bedtime., Starting Fri 02/15/2016, Print         Virgel Manifold, MD 02/18/16 1257

## 2016-02-21 ENCOUNTER — Emergency Department (HOSPITAL_COMMUNITY): Payer: Medicare HMO

## 2016-02-21 ENCOUNTER — Encounter (HOSPITAL_COMMUNITY): Payer: Self-pay | Admitting: Internal Medicine

## 2016-02-21 ENCOUNTER — Emergency Department (HOSPITAL_COMMUNITY)
Admission: EM | Admit: 2016-02-21 | Discharge: 2016-02-22 | Disposition: A | Discharge status: Home / Self Care | Payer: Medicare HMO | Attending: Emergency Medicine | Admitting: Emergency Medicine

## 2016-02-21 DIAGNOSIS — R51 Headache: Secondary | ICD-10-CM

## 2016-02-21 DIAGNOSIS — Z79899 Other long term (current) drug therapy: Secondary | ICD-10-CM | POA: Insufficient documentation

## 2016-02-21 DIAGNOSIS — E119 Type 2 diabetes mellitus without complications: Secondary | ICD-10-CM | POA: Diagnosis not present

## 2016-02-21 DIAGNOSIS — J45909 Unspecified asthma, uncomplicated: Secondary | ICD-10-CM | POA: Diagnosis not present

## 2016-02-21 DIAGNOSIS — Z7982 Long term (current) use of aspirin: Secondary | ICD-10-CM | POA: Insufficient documentation

## 2016-02-21 DIAGNOSIS — D649 Anemia, unspecified: Secondary | ICD-10-CM

## 2016-02-21 DIAGNOSIS — I10 Essential (primary) hypertension: Secondary | ICD-10-CM | POA: Diagnosis not present

## 2016-02-21 DIAGNOSIS — R519 Headache, unspecified: Secondary | ICD-10-CM

## 2016-02-21 LAB — CBC WITH DIFFERENTIAL/PLATELET
Basophils Absolute: 0 10*3/uL (ref 0.0–0.1)
Basophils Relative: 0 %
Eosinophils Absolute: 0 10*3/uL (ref 0.0–0.7)
Eosinophils Relative: 0 %
HEMATOCRIT: 28.8 % — AB (ref 36.0–46.0)
HEMOGLOBIN: 9 g/dL — AB (ref 12.0–15.0)
LYMPHS ABS: 3.3 10*3/uL (ref 0.7–4.0)
Lymphocytes Relative: 38 %
MCH: 26.2 pg (ref 26.0–34.0)
MCHC: 31.3 g/dL (ref 30.0–36.0)
MCV: 84 fL (ref 78.0–100.0)
MONOS PCT: 6 %
Monocytes Absolute: 0.6 10*3/uL (ref 0.1–1.0)
NEUTROS ABS: 4.7 10*3/uL (ref 1.7–7.7)
NEUTROS PCT: 56 %
Platelets: 403 10*3/uL — ABNORMAL HIGH (ref 150–400)
RBC: 3.43 MIL/uL — ABNORMAL LOW (ref 3.87–5.11)
RDW: 15.1 % (ref 11.5–15.5)
WBC: 8.6 10*3/uL (ref 4.0–10.5)

## 2016-02-21 LAB — COMPREHENSIVE METABOLIC PANEL
ALT: 42 U/L (ref 14–54)
ANION GAP: 10 (ref 5–15)
AST: 34 U/L (ref 15–41)
Albumin: 4.3 g/dL (ref 3.5–5.0)
Alkaline Phosphatase: 81 U/L (ref 38–126)
BUN: 16 mg/dL (ref 6–20)
CHLORIDE: 96 mmol/L — AB (ref 101–111)
CO2: 27 mmol/L (ref 22–32)
CREATININE: 1.01 mg/dL — AB (ref 0.44–1.00)
Calcium: 9.1 mg/dL (ref 8.9–10.3)
GFR calc non Af Amer: 53 mL/min — ABNORMAL LOW (ref >60)
Glucose, Bld: 135 mg/dL — ABNORMAL HIGH (ref 65–99)
Potassium: 3.1 mmol/L — ABNORMAL LOW (ref 3.5–5.1)
SODIUM: 133 mmol/L — AB (ref 135–145)
Total Bilirubin: 0.5 mg/dL (ref 0.3–1.2)
Total Protein: 8.1 g/dL (ref 6.5–8.1)

## 2016-02-21 NOTE — ED Triage Notes (Signed)
Pt reports she has had a headache today. Pt states she is hurting above her left eye, left side of her nose and under her left eye. Pt reports she feels fine getting up and doing things and then she just gets tired and feels warn out. Pt has states she has just felt week since she was discharged from the hospital.

## 2016-02-21 NOTE — ED Notes (Signed)
Pt states she has been a weak since leaving the hospital a week ago. Pt complaining of a headache that started today.

## 2016-02-22 NOTE — ED Provider Notes (Signed)
[facility] DEPT Provider Note   CSN: [id number] Arrival date & time: 02/21/16  1913     History   Chief Complaint Chief Complaint  Patient presents with  . Headache    HPI Stacey Riley is a 75 y.o. female.  Patient presents with 2 complaints. First complaint is that she has felt fatigued lightheaded and sometimes short of breath since her discharge on November 3. Patient was admitted at that time for a GI bleed. Underwent an upper endoscopy by GI medicine. No active bleeding at the time according to notes. Patient's hemoglobin got down into the 8 range. Patient denies any further bleeding. Denies any chest pain. No fevers. Patient's other complaint started today a left frontal fore head headache associated with some maxillary sinus pressure on the left side. No neck stiffness.      Past Medical History:  Diagnosis Date  . Arthritis   . Asthma   . Bradycardia   . DM (diabetes mellitus) ([place])   . GERD (gastroesophageal reflux disease)   . High cholesterol   . HTN (hypertension)     Patient Active Problem List   Diagnosis Date Noted  . UGIB (upper gastrointestinal bleed) 02/14/2016  . Diabetes ([place]) 02/14/2016  . Essential hypertension 02/14/2016  . Hyperlipidemia 02/14/2016  . Acute bronchitis 02/14/2016  . GERD (gastroesophageal reflux disease) 07/14/2011  . Screen for colon cancer 07/14/2011  . Dysphagia 07/14/2011    Past Surgical History:  Procedure Laterality Date  . CHOLECYSTECTOMY  1990s  . COLONOSCOPY  03/18/1999   Dr Rourk-int  hemorrhoids, pancolonic diverticula  . COLONOSCOPY WITH ESOPHAGOGASTRODUODENOSCOPY (EGD)  08/06/2011   Rourk: Schatzki ring, moderate sized hiatal hernia status post dilation for history of dysphagia. Pancolonic diverticula, single diminutive polyp at the base of the cecum removed (tubular adenoma). Next colonoscopy recommended for April 2018  . ESOPHAGOGASTRODUODENOSCOPY  07/02/01   Dr Rourk-Schatzki's ring s/p 44F maloney  dilation, otherwise normal/small hiatal hernia/   Linear erosion and ulceration in the proximal stomach/  The ulcerated lesion in the proximal stomach  benign.This may be a [name] lesion secondary to trauma to the mucosa, straddling the  diaphragmatic hiatus in the presence of a hiatal hernia   . ESOPHAGOGASTRODUODENOSCOPY N/A 02/14/2016   Procedure: ESOPHAGOGASTRODUODENOSCOPY (EGD);  Surgeon: Daneil Dolin, MD;  [facility];  Service: Endoscopy;  Laterality: N/A;  Venia Minks DILATION  08/06/2011   Procedure: Venia Minks DILATION;  Surgeon: Daneil Dolin, MD;  Location: [facility];  Service: Endoscopy;  Laterality: N/A;  . TUBAL LIGATION      OB History    No data available       Home Medications    Prior to Admission medications   Medication Sig Start Date End Date Taking? Authorizing Provider  albuterol (PROAIR HFA) 108 (90 BASE) MCG/ACT inhaler Inhale 2 puffs into the lungs every 6 (six) hours as needed for wheezing or shortness of breath.    Historical Provider, MD  amLODipine (NORVASC) 10 MG tablet Take 10 mg by mouth every morning.  04/22/11   Historical Provider, MD  aspirin 81 MG tablet Take 81 mg by mouth every morning.     Historical Provider, MD  hydrochlorothiazide (HYDRODIURIL) 25 MG tablet Take 25 mg by mouth every morning.  04/22/11   Historical Provider, MD  lisinopril (PRINIVIL,ZESTRIL) 40 MG tablet Take 40 mg by mouth every morning.  04/22/11   Historical Provider, MD  loratadine (CLARITIN) 10 MG tablet Take 1 tablet (10 mg total)  by mouth daily. 02/16/16   Thurnell Lose, MD  pantoprazole (PROTONIX) 40 MG tablet Take 1 tablet (40 mg total) by mouth 2 (two) times daily before a meal. 02/15/16   Thurnell Lose, MD  pravastatin (PRAVACHOL) 20 MG tablet Take 20 mg by mouth at bedtime.    Historical Provider, MD  sucralfate (CARAFATE) 1 GM/10ML suspension Take 10 mLs (1 g total) by mouth 4 (four) times daily -  with meals and at bedtime. 02/15/16   Thurnell Lose, MD      Family History Family History  Problem Relation Age of Onset  . Aneurysm Daughter   . Stroke Daughter   . Hypertension Mother   . Dementia Mother   . Hypertension Father   . Kidney cancer Brother   . Cancer Brother   . Cancer Brother   . Colon cancer Neg Hx   . Inflammatory bowel disease Neg Hx     Social History Social History  Substance Use Topics  . Smoking status: Never Smoker  . Smokeless tobacco: Never Used  . Alcohol use No     Allergies   Augmentin [amoxicillin-pot clavulanate] and Sulfa antibiotics   Review of Systems Review of Systems  Constitutional: Positive for fatigue.  HENT: Positive for congestion.   Eyes: Negative for visual disturbance.  Respiratory: Positive for shortness of breath.   Cardiovascular: Negative for chest pain.  Gastrointestinal: Negative for abdominal pain, blood in stool, diarrhea, nausea and vomiting.  Genitourinary: Negative for dysuria.  Musculoskeletal: Negative for back pain.  Skin: Negative for rash.  Neurological: Positive for light-headedness.  Hematological: Does not bruise/bleed easily.  Psychiatric/Behavioral: Negative for confusion.     Physical Exam Updated Vital Signs BP 145/56   Pulse 67   Temp 98.5 F (36.9 C) (Oral)   Resp 18   Ht 5\' 4"  (1.626 m)   Wt 77.1 kg   SpO2 98%   BMI 29.18 kg/m   Physical Exam  Constitutional: She is oriented to person, place, and time. She appears well-developed and well-nourished. No distress.  HENT:  Head: Normocephalic and atraumatic.  Mouth/Throat: Oropharynx is clear and moist.  Eyes: Conjunctivae and EOM are normal. Pupils are equal, round, and reactive to light.  Neck: Normal range of motion. Neck supple.  Cardiovascular: Normal rate and regular rhythm.   Pulmonary/Chest: Effort normal and breath sounds normal. No respiratory distress.  Abdominal: Soft. Bowel sounds are normal. There is no tenderness.  Musculoskeletal: Normal range of motion.  Neurological:  She is alert and oriented to person, place, and time. No cranial nerve deficit or sensory deficit. She exhibits normal muscle tone. Coordination normal.  Skin: Skin is warm.  Nursing note and vitals reviewed.    ED Treatments / Results  Labs (all labs ordered are listed, but only abnormal results are displayed) Labs Reviewed  COMPREHENSIVE METABOLIC PANEL - Abnormal; Notable for the following:       Result Value   Sodium 133 (*)    Potassium 3.1 (*)    Chloride 96 (*)    Glucose, Bld 135 (*)    Creatinine, Ser 1.01 (*)    GFR calc non Af Amer 53 (*)    All other components within normal limits  CBC WITH DIFFERENTIAL/PLATELET - Abnormal; Notable for the following:    RBC 3.43 (*)    Hemoglobin 9.0 (*)    HCT 28.8 (*)    Platelets 403 (*)    All other components within normal limits  CBC  WITH DIFFERENTIAL/PLATELET    EKG  EKG Interpretation None       Radiology Ct Head Wo Contrast  Result Date: 02/22/2016 CLINICAL DATA:  Left-sided frontal and maxillary pain and pressure for 1 day. Generalized weakness for 1 week. Headache. EXAM: CT HEAD WITHOUT CONTRAST TECHNIQUE: Contiguous axial images were obtained from the base of the skull through the vertex without intravenous contrast. COMPARISON:  None. FINDINGS: Brain: Mild diffuse cerebral atrophy. No ventricular dilatation. Patchy low-attenuation changes in the deep white matter consistent with small vessel ischemia. No evidence of acute infarction, hemorrhage, hydrocephalus, extra-axial collection or mass lesion/mass effect. Vascular: No hyperdense vessel or unexpected calcification. Skull: Normal. Negative for fracture or focal lesion. Sinuses/Orbits: No acute finding. Other: None. IMPRESSION: No acute intracranial abnormalities. Mild chronic atrophy and small vessel ischemic changes. Electronically Signed   By: Lucienne Capers M.D.   On: 02/22/2016 00:00    Procedures Procedures (including critical care time)  Medications  Ordered in ED Medications - No data to display   Initial Impression / Assessment and Plan / ED Course  I have reviewed the triage vital signs and the nursing notes.  Pertinent labs & imaging results that were available during my care of the patient were reviewed by me and considered in my medical decision making (see chart for details).  Clinical Course    Patient presents with 2 complaints. One complaint is a headache which is a left fore head headache associated with some maxillary sinus pressure. That started today. The other complaint is fatigue and lightheadedness and some shortness of breath.  Patient was recently admitted for a GI bleed. Hemoglobin got down into the 8 range. Today's hemoglobin shows improvement up to 9. Oxygen saturations normal on room air. Head CT shows no acute findings no evidence of sinusitis.  Patient nontoxic no acute distress. Not tachycardic not hypo-intensive. Patient stable for discharge home and follow-up with her primary care doctor.   Final Clinical Impressions(s) / ED Diagnoses   Final diagnoses:  Anemia, unspecified type  Acute intractable headache, unspecified headache type    New Prescriptions New Prescriptions   No medications on file     Fredia Sorrow, MD 02/22/16 0028

## 2016-02-22 NOTE — ED Notes (Signed)
Pt alert & oriented x4, stable gait. Patient given discharge instructions, paperwork & prescription(s). Patient instructed to stop at the registration desk to finish any additional paperwork. Patient verbalized understanding. Pt left department in wheelchair escorted by staff. Pt left w/ no further questions. 

## 2016-02-22 NOTE — Discharge Instructions (Signed)
Your anemia is showing slight improvement since that time you were discharged from the hospital. Would expect you to feel lightheaded and fatigued. Return for any new or worse symptoms. Headache may be secondary to some sinus pressure. But head CT was negative for any significant findings.

## 2016-03-14 ENCOUNTER — Encounter: Payer: Self-pay | Admitting: Nurse Practitioner

## 2016-03-14 ENCOUNTER — Ambulatory Visit (INDEPENDENT_AMBULATORY_CARE_PROVIDER_SITE_OTHER): Payer: Medicare HMO | Admitting: Nurse Practitioner

## 2016-03-14 VITALS — BP 143/59 | HR 71 | Temp 97.6°F | Ht 64.0 in | Wt 168.8 lb

## 2016-03-14 DIAGNOSIS — K922 Gastrointestinal hemorrhage, unspecified: Secondary | ICD-10-CM | POA: Diagnosis not present

## 2016-03-14 DIAGNOSIS — D649 Anemia, unspecified: Secondary | ICD-10-CM

## 2016-03-14 DIAGNOSIS — K219 Gastro-esophageal reflux disease without esophagitis: Secondary | ICD-10-CM

## 2016-03-14 HISTORY — DX: Anemia, unspecified: D64.9

## 2016-03-14 MED ORDER — SUCRALFATE 1 G PO TABS: 1.0000 g | ORAL_TABLET | Freq: Three times a day (TID) | ORAL | 1 refills | Status: DC

## 2016-03-14 MED ORDER — PANTOPRAZOLE SODIUM 40 MG PO TBEC: 40.0000 mg | DELAYED_RELEASE_TABLET | Freq: Two times a day (BID) | ORAL | 1 refills | Status: DC

## 2016-03-14 NOTE — Assessment & Plan Note (Signed)
Last hemoglobin drawn approximately 2 weeks after hospital discharge which shows significant improvement in anemia to a hemoglobin in the 9 range. She seems to be physiologically responding appropriately. No further bleeding noted, no hematemesis. Continue PPI, Carafate. Return for follow-up in 3 months. Per the patient primary care has artery ordered labs to recheck her anemia.

## 2016-03-14 NOTE — Progress Notes (Signed)
Referring Provider: Cameron Proud, FNP Primary Care Physician:  [facility] Primary GI:  Dr. Gala Romney  Chief Complaint  Patient presents with  . GI Bleeding    follow up from hospital    HPI:   Stacey Riley is a 75 y.o. female who presents for hospital follow-up. The patient was admitted to the hospital from 02/14/2016 through 02/15/2016 for hematemesis. She has a history of GERD. Prior to presentation noted 10+ episodes of vomiting and diarrhea, also noted dark brown/black hematemesis. H&H was stable not requiring transfusion, soft GI underwent upper endoscopy showing a large hiatal hernia and hernia-induced [name] lesion. Was placed on a PPI twice daily and Carafate and recommended GI outpatient follow-up. Baseline hemoglobin 12.92 years ago and on admission was found to be 10.4 which declined to 9.6 and 8.3 on the day of discharge.  EGD recommendations include no necessary repeat endoscopy and follow-up with GI.  The patient was seen in the emergency department on 02/21/2016 for anemia with associated lightheadedness and some dyspnea since hospital discharge. Denies any further overt bleeding at that time. Hemoglobin at that time was noted to be 9.0 which was an improvement from her discharge hemoglobin approximately 1 week prior.  Today she states she feels "a whole lot better." Has not noted any more GI bleed or hematemesis.  Energy level better. Denies chest pain, dyspnea, fatigue, weakness. Occasional brief episodes of dizziness which self-resolve and CT head in the ER unremarkable. Has occasional GERD symptoms. Symptoms are diet-dependent, takes Protonix bid. Is also on Carafate. Needs refills. Denies chest pain, dyspnea, dizziness, lightheadedness, syncope, near syncope. Denies any other upper or lower GI symptoms.  Past Medical History:  Diagnosis Date  . Arthritis   . Asthma   . Bradycardia   . DM (diabetes mellitus) ([place])   . GERD  (gastroesophageal reflux disease)   . High cholesterol   . HTN (hypertension)     Past Surgical History:  Procedure Laterality Date  . CHOLECYSTECTOMY  1990s  . COLONOSCOPY  03/18/1999   Dr Rourk-int  hemorrhoids, pancolonic diverticula  . COLONOSCOPY WITH ESOPHAGOGASTRODUODENOSCOPY (EGD)  08/06/2011   Rourk: Schatzki ring, moderate sized hiatal hernia status post dilation for history of dysphagia. Pancolonic diverticula, single diminutive polyp at the base of the cecum removed (tubular adenoma). Next colonoscopy recommended for April 2018  . ESOPHAGOGASTRODUODENOSCOPY  07/02/01   Dr Rourk-Schatzki's ring s/p 42F maloney dilation, otherwise normal/small hiatal hernia/   Linear erosion and ulceration in the proximal stomach/  The ulcerated lesion in the proximal stomach  benign.This may be a [name] lesion secondary to trauma to the mucosa, straddling the  diaphragmatic hiatus in the presence of a hiatal hernia   . ESOPHAGOGASTRODUODENOSCOPY N/A 02/14/2016   Procedure: ESOPHAGOGASTRODUODENOSCOPY (EGD);  Surgeon: Daneil Dolin, MD;  [facility];  Service: Endoscopy;  Laterality: N/A;  Venia Minks DILATION  08/06/2011   Procedure: Venia Minks DILATION;  Surgeon: Daneil Dolin, MD;  Location: [facility];  Service: Endoscopy;  Laterality: N/A;  . TUBAL LIGATION      Current Outpatient Prescriptions  Medication Sig Dispense Refill  . albuterol (PROAIR HFA) 108 (90 BASE) MCG/ACT inhaler Inhale 2 puffs into the lungs every 6 (six) hours as needed for wheezing or shortness of breath.    Marland Kitchen amLODipine (NORVASC) 10 MG tablet Take 10 mg by mouth every morning.     Marland Kitchen aspirin 81 MG tablet Take 81 mg by mouth every  morning.     . hydrochlorothiazide (HYDRODIURIL) 25 MG tablet Take 25 mg by mouth every morning.     Marland Kitchen lisinopril (PRINIVIL,ZESTRIL) 40 MG tablet Take 40 mg by mouth every morning.     . loratadine (CLARITIN) 10 MG tablet Take 1 tablet (10 mg total) by mouth daily. 30 tablet 0  .  pantoprazole (PROTONIX) 40 MG tablet Take 1 tablet (40 mg total) by mouth 2 (two) times daily before a meal. 180 tablet 1  . pravastatin (PRAVACHOL) 20 MG tablet Take 20 mg by mouth at bedtime.    . sucralfate (CARAFATE) 1 g tablet Take 1 tablet (1 g total) by mouth 4 (four) times daily -  with meals and at bedtime. 30 tablet 1   No current facility-administered medications for this visit.     Allergies as of 03/14/2016 - Review Complete 03/14/2016  Allergen Reaction Noted  . Augmentin [amoxicillin-pot clavulanate] Nausea And Vomiting 02/14/2016  . Sulfa antibiotics Swelling 07/14/2011    Family History  Problem Relation Age of Onset  . Aneurysm Daughter   . Stroke Daughter   . Hypertension Mother   . Dementia Mother   . Hypertension Father   . Kidney cancer Brother   . Cancer Brother   . Cancer Brother   . Colon cancer Neg Hx   . Inflammatory bowel disease Neg Hx     Social History   Social History  . Marital status: Divorced    Spouse name: N/A  . Number of children: 5  . Years of education: N/A   Occupational History  . retired; [employer] fundraiser    Social History Main Topics  . Smoking status: Never Smoker  . Smokeless tobacco: Never Used  . Alcohol use No  . Drug use: No  . Sexual activity: Not Asked   Other Topics Concern  . None   Social History Narrative   Lives alone    Review of Systems: General: Negative for anorexia, weight loss, fever, chills, fatigue, weakness. ENT: Negative for hoarseness, difficulty swallowing. CV: Negative for chest pain, angina, palpitations, peripheral edema.  Respiratory: Negative for dyspnea at rest, cough, sputum, wheezing.  GI: See history of present illness. Endo: Negative for unusual weight change.  Heme: Negative for bruising or bleeding. Allergy: Negative for rash or hives.   Physical Exam: BP (!) 143/59   Pulse 71   Temp 97.6 F (36.4 C) (Oral)   Ht 5\' 4"  (1.626 m)   Wt 168 lb 12.8 oz (76.6 kg)   BMI 28.97 kg/m   General:   Alert and oriented. Pleasant and cooperative. Well-nourished and well-developed.  Eyes:  Without icterus, sclera clear and conjunctiva pink.  Ears:  Normal auditory acuity. Cardiovascular:  S1, S2 present without murmurs appreciated. Extremities without clubbing or edema. Respiratory:  Clear to auscultation bilaterally. No wheezes, rales, or rhonchi. No distress.  Gastrointestinal:  +BS, soft, non-tender and non-distended. No HSM noted. No guarding or rebound. No masses appreciated.  Rectal:  Deferred  Musculoskalatal:  Symmetrical without gross deformities. Neurologic:  Alert and oriented x4;  grossly normal neurologically. Psych:  Alert and cooperative. Normal mood and affect. Heme/Lymph/Immune: No excessive bruising noted.    03/14/2016 11:57 AM   Disclaimer: This note was dictated with voice recognition software. Similar sounding words can inadvertently be transcribed and may not be corrected upon review.

## 2016-03-14 NOTE — Assessment & Plan Note (Signed)
History of GERD previously on Prilosec. She is now currently on Protonix twice a day for [facility] ulcer and hospitalization for upper GI bleed and anemia. She continues to take this. Carafate 4 times a day as needed. Occasional breakthrough symptoms which are typically diet dependent. Return for follow-up in 3 months.

## 2016-03-14 NOTE — Patient Instructions (Signed)
1. I have refilled her Carafate pills. 2. I have refilled her Protonix. 3. Keep taking Protonix twice a day until we see you back in 3 months. 4. Call if he noticed any more bleeding, blood in her vomit, or other symptoms that are concerning.

## 2016-03-14 NOTE — Assessment & Plan Note (Signed)
Upper GI bleed noted while inpatient due to [facility] ulcer as noted in history of present illness and upper endoscopy operative note. The patient has not had any recurrent GI bleed. Her symptoms are significantly improved and she "feels much better." Continue to monitor, PCP has RD reordered labs again so no need to do so today. Continue Protonix twice a day for at least the next 2 months, Carafate 4 times a day as needed for breakthrough GERD symptoms. Return for follow-up in 3 months.

## 2016-03-17 NOTE — Progress Notes (Signed)
cc'ed to pcp °

## 2016-03-18 ENCOUNTER — Other Ambulatory Visit: Payer: Self-pay

## 2016-03-18 DIAGNOSIS — K219 Gastro-esophageal reflux disease without esophagitis: Secondary | ICD-10-CM

## 2016-03-18 DIAGNOSIS — K922 Gastrointestinal hemorrhage, unspecified: Secondary | ICD-10-CM

## 2016-03-20 MED ORDER — SUCRALFATE 1 G PO TABS: 1.0000 g | ORAL_TABLET | Freq: Three times a day (TID) | ORAL | 1 refills | Status: DC

## 2016-03-26 ENCOUNTER — Telehealth: Payer: Self-pay | Admitting: Internal Medicine

## 2016-03-26 ENCOUNTER — Other Ambulatory Visit: Payer: Self-pay | Admitting: Nurse Practitioner

## 2016-03-26 DIAGNOSIS — D649 Anemia, unspecified: Secondary | ICD-10-CM

## 2016-03-26 NOTE — Telephone Encounter (Signed)
Addendum: lab draw was 03/21/16 with H/H 8.2/26.4. Previous labs 03/05/16 with H/H 8.8/27.7

## 2016-03-26 NOTE — Telephone Encounter (Signed)
Please reach out to the patient and ask her how she's doing. Ask if she has seen any further red blood rectally or any dark/black stools. Let's tentatively schedule a repeat CBC tomorrow first thing in the morning (1 week from last draw). May require transfusion and/or referral to hematology for anemia management.  Please tell her if she has chest pain, worsening shortness of breath, dizziness, lightheadedness, passing out, or any worsening symptoms call us immediately and/or go to the ER.

## 2016-03-26 NOTE — Telephone Encounter (Signed)
Spoke with the pt, she said she feels good. No blood in her stool and no dark or black stools. Pt will go have cbc done tomorrow. She prefers to go to her pcp but will call us if she needs lab order. Lab order done.

## 2016-03-26 NOTE — Telephone Encounter (Signed)
Pt was seen by EG on 03/14/2016 for upper GI bleed. Her PCP called today to say that she rechecked her CBC on 12/8 and was faxing it to [place] for EG to review. Please advise if patient needs to come back in. Labs are clipped to yellow folder and placed in EG office chair.

## 2016-03-31 NOTE — Telephone Encounter (Signed)
Can we check to see if she has had her CBC done? I don't seen any results.  Thanks

## 2016-04-01 ENCOUNTER — Other Ambulatory Visit: Payer: Self-pay

## 2016-04-01 DIAGNOSIS — D649 Anemia, unspecified: Secondary | ICD-10-CM

## 2016-04-01 NOTE — Telephone Encounter (Signed)
Pt had blood work done last week.

## 2016-04-01 NOTE — Telephone Encounter (Signed)
Pt is aware. She said she is doing ok right now. Her pcp called her last week and had her very upset but she is fine now. I have mailed new lab order to her.  Please reschedule pts ov to January.

## 2016-04-01 NOTE — Telephone Encounter (Signed)
RESCHEDULED TO January APPOINTMENT AND CALLED PATIENT AND LEFT MESSAGE ABOUT DATE AND TIME CHANGE.

## 2016-04-01 NOTE — Telephone Encounter (Signed)
CBC results received on my desk, drawn 03/27/16. Hgb stable from PCP draw the week prior (8.2 -> 8.3). Please call the patient and tell her if she sees any more bleeding, has any chest pain, shortness of breath, dizziness, passing out to go to the ER. Let's plan to recheck CBC in 2 weeks and bump up her follow-up in 1st or 2nd week in January to discuss any further evaluation needed.

## 2016-04-09 ENCOUNTER — Encounter: Payer: Self-pay | Admitting: Internal Medicine

## 2016-04-11 LAB — CBC WITH DIFFERENTIAL/PLATELET
BASOS ABS: 50 {cells}/uL (ref 0–200)
Basophils Relative: 1 %
EOS ABS: 50 {cells}/uL (ref 15–500)
EOS PCT: 1 %
HCT: 28.3 % — ABNORMAL LOW (ref 35.0–45.0)
Hemoglobin: 8 g/dL — ABNORMAL LOW (ref 11.7–15.5)
LYMPHS PCT: 45 %
Lymphs Abs: 2250 cells/uL (ref 850–3900)
MCH: 21.7 pg — AB (ref 27.0–33.0)
MCHC: 28.3 g/dL — AB (ref 32.0–36.0)
MCV: 76.9 fL — AB (ref 80.0–100.0)
MONOS PCT: 9 %
MPV: 10.9 fL (ref 7.5–12.5)
Monocytes Absolute: 450 cells/uL (ref 200–950)
NEUTROS PCT: 44 %
Neutro Abs: 2200 cells/uL (ref 1500–7800)
PLATELETS: 322 10*3/uL (ref 140–400)
RBC: 3.68 MIL/uL — ABNORMAL LOW (ref 3.80–5.10)
RDW: 17.7 % — AB (ref 11.0–15.0)
WBC: 5 10*3/uL (ref 3.8–10.8)

## 2016-04-11 NOTE — Progress Notes (Signed)
Nov 2017: Hgb 8.8, 03/21/16 Hgb 8.2. Mild drift with Hgb now 8. Patient asymptomatic. No overt GI bleeding. Let's check iron, ferritin, TIBC. Discussed signs/symptoms that would necessitate urgent evaluation. Needs appt earlier than Jan 18th with Randall Hiss if anything available.

## 2016-04-15 ENCOUNTER — Other Ambulatory Visit: Payer: Self-pay | Admitting: Gastroenterology

## 2016-04-15 ENCOUNTER — Other Ambulatory Visit: Payer: Self-pay

## 2016-04-15 DIAGNOSIS — D649 Anemia, unspecified: Secondary | ICD-10-CM

## 2016-04-15 NOTE — Progress Notes (Signed)
MOVED APPOINTMENT TO 04/18/16 AND NOTIFIED PATIENT

## 2016-04-18 ENCOUNTER — Encounter: Payer: Self-pay | Admitting: Nurse Practitioner

## 2016-04-18 ENCOUNTER — Ambulatory Visit (INDEPENDENT_AMBULATORY_CARE_PROVIDER_SITE_OTHER): Payer: Medicare HMO | Admitting: Nurse Practitioner

## 2016-04-18 VITALS — BP 158/86 | HR 92 | Temp 98.1°F | Ht 64.0 in | Wt 169.0 lb

## 2016-04-18 DIAGNOSIS — K922 Gastrointestinal hemorrhage, unspecified: Secondary | ICD-10-CM | POA: Diagnosis not present

## 2016-04-18 DIAGNOSIS — D649 Anemia, unspecified: Secondary | ICD-10-CM

## 2016-04-18 NOTE — Patient Instructions (Signed)
1. Have your iron labs drawn today. 2. We will call you with results when we get them back 3. We will refer you to hematology (blood doctors) for your anemia 4. Depending on your iron level results and when hematology can see you we may call you to go get a unit of blood 5. Return for follow-up in 4 weeks 6. As we discussed, if you have any worsening shortness of breath, chest pain, dizziness, passing out, or other scary symptoms then proceed to the ER to be evaluated.

## 2016-04-18 NOTE — Assessment & Plan Note (Signed)
No obvious GI bleed noted. Continue to monitor for bleeding and notify us if any is noted. Discussed ER precautions with the patient. Return for follow-up in 4 months.

## 2016-04-18 NOTE — Progress Notes (Signed)
Referring Provider: [facility] Primary Care Physician:  [facility] Primary GI:  Dr. Gala Romney  Chief Complaint  Patient presents with  . Anemia    no bleeding seen, weak at times    HPI:   Stacey Riley is a 76 y.o. female who presents to follow-up on anemia. The patient was last seen in our office 03/14/2016 for hospitalization follow-up for upper GI bleed and hematemesis. The etiology of her bleed on endoscopy was found to be large hiatal hernia with hernia-induced [name] lesion. She was started on PPI and follow-up with GI. Her remote hemoglobin baseline 12.9. This declined to 10.4 on admission and then further to 9.6 and 8.3 by date of discharge. Recommended no needed follow-up EGD. At the time of her last visit she was feeling much better, occasional GERD, no other significant GI concerns. Energy level better, no symptoms of acute on chronic anemia.  Telephone note middle of December notes PCP recheck of CBC on 03/21/16 was declining hemoglobin to 8.2 and was previously 8.8. We reached out the patient said she was doing well and denied any further obvious bleeding. CBC was repeated 03/27/2016 which showed stable hemoglobin of 8.3. Recommended recheck CBC in 2 weeks and move up her follow-up visit to early January. CBC drawn 04/09/2016 showed mild drift to 8.0. Patient remained asymptomatic. Her orders for iron, ferritin, iron binding capacity has not been completed.  Today she states she hasn't felt well this week due to coughing, thinks she has a cold. Has had some decreased energy this week (again, thinks its related to a cold) but last week her energy was good. Denies chest pain, dizziness, syncope, near syncope. Some dyspnea, particularly after a coughing spell or with significant exertion. Denies hematochezia or melena. Denies abdominal pain, N/V. Denies any other upper or lower GI symptoms.  PCP has told her to "cut back" on daily ASA, take it  occasionally.  Past Medical History:  Diagnosis Date  . Arthritis   . Asthma   . Bradycardia   . DM (diabetes mellitus) ([place])   . GERD (gastroesophageal reflux disease)   . High cholesterol   . HTN (hypertension)     Past Surgical History:  Procedure Laterality Date  . CHOLECYSTECTOMY  1990s  . COLONOSCOPY  03/18/1999   Dr Rourk-int  hemorrhoids, pancolonic diverticula  . COLONOSCOPY WITH ESOPHAGOGASTRODUODENOSCOPY (EGD)  08/06/2011   Rourk: Schatzki ring, moderate sized hiatal hernia status post dilation for history of dysphagia. Pancolonic diverticula, single diminutive polyp at the base of the cecum removed (tubular adenoma). Next colonoscopy recommended for April 2018  . ESOPHAGOGASTRODUODENOSCOPY  07/02/01   Dr Rourk-Schatzki's ring s/p 48F maloney dilation, otherwise normal/small hiatal hernia/   Linear erosion and ulceration in the proximal stomach/  The ulcerated lesion in the proximal stomach  benign.This may be a [name] lesion secondary to trauma to the mucosa, straddling the  diaphragmatic hiatus in the presence of a hiatal hernia   . ESOPHAGOGASTRODUODENOSCOPY N/A 02/14/2016   Procedure: ESOPHAGOGASTRODUODENOSCOPY (EGD);  Surgeon: Daneil Dolin, MD;  Location: [facility];  Service: Endoscopy;  Laterality: N/A;  Venia Minks DILATION  08/06/2011   Procedure: Venia Minks DILATION;  Surgeon: Daneil Dolin, MD;  Location: [facility];  Service: Endoscopy;  Laterality: N/A;  . TUBAL LIGATION      Current Outpatient Prescriptions  Medication Sig Dispense Refill  . albuterol (PROAIR HFA) 108 (90 BASE) MCG/ACT inhaler Inhale 2 puffs into  the lungs every 6 (six) hours as needed for wheezing or shortness of breath.    Marland Kitchen amLODipine (NORVASC) 10 MG tablet Take 10 mg by mouth every morning.     Marland Kitchen aspirin 81 MG tablet Take 81 mg by mouth. Takes once a week    . hydrochlorothiazide (HYDRODIURIL) 25 MG tablet Take 25 mg by mouth every morning.     Marland Kitchen lisinopril (PRINIVIL,ZESTRIL) 40 MG  tablet Take 40 mg by mouth every morning.     . loratadine (CLARITIN) 10 MG tablet Take 1 tablet (10 mg total) by mouth daily. 30 tablet 0  . pantoprazole (PROTONIX) 40 MG tablet Take 1 tablet (40 mg total) by mouth 2 (two) times daily before a meal. 180 tablet 1  . pravastatin (PRAVACHOL) 20 MG tablet Take 20 mg by mouth at bedtime.    . sucralfate (CARAFATE) 1 g tablet Take 1 tablet (1 g total) by mouth 4 (four) times daily -  with meals and at bedtime. 120 tablet 1   No current facility-administered medications for this visit.     Allergies as of 04/18/2016 - Review Complete 04/18/2016  Allergen Reaction Noted  . Augmentin [amoxicillin-pot clavulanate] Nausea And Vomiting 02/14/2016  . Sulfa antibiotics Swelling 07/14/2011    Family History  Problem Relation Age of Onset  . Aneurysm Daughter   . Stroke Daughter   . Hypertension Mother   . Dementia Mother   . Hypertension Father   . Kidney cancer Brother   . Cancer Brother   . Cancer Brother   . Colon cancer Neg Hx   . Inflammatory bowel disease Neg Hx     Social History   Social History  . Marital status: Divorced    Spouse name: N/A  . Number of children: 5  . Years of education: N/A   Occupational History  . retired; Char[employer] fundraiser    Social History Main Topics  . Smoking status: Never Smoker  . Smokeless tobacco: Never Used  . Alcohol use No  . Drug use: No  . Sexual activity: Not Asked   Other Topics Concern  . None   Social History Narrative   Lives alone    Review of Systems: General: Negative for anorexia, weight loss, fever, chills. ENT: Negative for hoarseness, difficulty swallowing. CV: Negative for chest pain, angina, palpitations, peripheral edema.  Respiratory: Negative for dyspnea at rest, wheezing.  GI: See history of present illness. Endo: Negative for unusual weight change.  Heme: Negative for bruising or bleeding. Allergy: Negative for rash or hives.   Physical Exam: BP (!) 158/86    Pulse 92   Temp 98.1 F (36.7 C) (Oral)   Ht 5\' 4"  (1.626 m)   Wt 169 lb (76.7 kg)   BMI 29.01 kg/m  General:   Alert and oriented. Pleasant and cooperative. Well-nourished and well-developed.  Head:  Normocephalic and atraumatic. Eyes:  Without icterus, sclera clear and conjunctiva pink.  Ears:  Normal auditory acuity. Cardiovascular:  S1, S2 present without murmurs appreciated. Extremities without clubbing or edema. Respiratory:  Clear to auscultation bilaterally. No wheezes, rales, or rhonchi. No distress.  Gastrointestinal:  +BS, soft, non-tender and non-distended. No HSM noted. No guarding or rebound. No masses appreciated.  Rectal:  Deferred  Musculoskalatal:  Symmetrical without gross deformities. Neurologic:  Alert and oriented x4;  grossly normal neurologically. Psych:  Alert and cooperative. Normal mood and affect. Heme/Lymph/Immune: No excessive bruising noted.    04/18/2016 11:19 AM   Disclaimer: This note was  dictated with voice recognition software. Similar sounding words can inadvertently be transcribed and may not be corrected upon review.

## 2016-04-18 NOTE — Assessment & Plan Note (Signed)
Issue with ongoing anemia and recent drifted down to a hemoglobin of 8. Iron studies are ordered and pending, the patient states she will go and have those drawn today. We will refer her to hematology for ongoing management of iron deficiency anemia in the setting of large hiatal hernia and Cameron ulcer. Return for follow-up in 4 weeks. I will have her complete the iron studies today and, pending appointment with hematology and results of iron studies, we may need to send her for transfusion of 1 unit PRBC given her hemoglobin. Discussed ER precautions with the patient. Return for follow-up in 4 weeks.

## 2016-04-19 LAB — IRON AND TIBC
%SAT: 3 % — AB (ref 11–50)
IRON: 10 ug/dL — AB (ref 45–160)
TIBC: 395 ug/dL (ref 250–450)
UIBC: 385 ug/dL (ref 125–400)

## 2016-04-19 LAB — FERRITIN: FERRITIN: 34 ng/mL (ref 20–288)

## 2016-04-21 NOTE — Progress Notes (Signed)
CC'D TO PCP °

## 2016-04-23 ENCOUNTER — Other Ambulatory Visit: Payer: Self-pay

## 2016-04-23 ENCOUNTER — Telehealth: Payer: Self-pay | Admitting: Nurse Practitioner

## 2016-04-23 ENCOUNTER — Telehealth: Payer: Self-pay

## 2016-04-23 DIAGNOSIS — K922 Gastrointestinal hemorrhage, unspecified: Secondary | ICD-10-CM

## 2016-04-23 DIAGNOSIS — D509 Iron deficiency anemia, unspecified: Secondary | ICD-10-CM

## 2016-04-23 NOTE — Telephone Encounter (Signed)
Pt called wanting lab results  

## 2016-04-23 NOTE — Telephone Encounter (Signed)
Please tell the patient hematology can't see her for a couple weeks. Because of how low her hgb is we want to arrange for her to go to [facility] short stay and get a unit of blood. We will contact her (likely tomorrow) to arrange this.

## 2016-04-23 NOTE — Telephone Encounter (Signed)
Hematology unable to see patient for a couple weeks. Please arrange for short stay transfusion of 1 unit PRBC. Thanks!

## 2016-04-23 NOTE — Telephone Encounter (Signed)
Talked with Amy and she said that it would be week after next before they could see her. They also asked if we could go ahead and give her some blood.

## 2016-04-23 NOTE — Telephone Encounter (Signed)
Please tell the patint her iron is low (10) but long term iron stores are normal. She should have a referral pending with hematology, can we call and check on that and see when they'll be able to get her in? Let me know so I can decide if she needs a PRBC in the interim.

## 2016-04-23 NOTE — Telephone Encounter (Signed)
Referral sent to hematology via Epic. Ginger called [place] for Amy to call office to schedule appt for pt asap.

## 2016-04-24 NOTE — Telephone Encounter (Signed)
Noted  

## 2016-04-24 NOTE — Telephone Encounter (Signed)
All orders have been signed and faxed to [facility].

## 2016-04-24 NOTE — Telephone Encounter (Signed)
Transfusion orders done and are on [facility] desk for signature. Pt is aware.

## 2016-04-24 NOTE — Telephone Encounter (Signed)
Pt is aware. All orders have been signed and faxed to [facility].

## 2016-04-25 ENCOUNTER — Encounter (HOSPITAL_COMMUNITY)
Admission: RE | Admit: 2016-04-25 | Discharge: 2016-04-25 | Disposition: A | Discharge status: Home / Self Care | Payer: Medicare HMO | Source: Ambulatory Visit | Attending: Nurse Practitioner | Admitting: Nurse Practitioner

## 2016-04-25 DIAGNOSIS — D649 Anemia, unspecified: Secondary | ICD-10-CM | POA: Diagnosis present

## 2016-04-25 LAB — PREPARE RBC (CROSSMATCH)

## 2016-04-28 ENCOUNTER — Encounter (HOSPITAL_COMMUNITY): Payer: Self-pay

## 2016-04-28 ENCOUNTER — Encounter (HOSPITAL_COMMUNITY)
Admission: RE | Admit: 2016-04-28 | Discharge: 2016-04-28 | Disposition: A | Discharge status: Home / Self Care | Payer: Medicare HMO | Source: Ambulatory Visit | Attending: Nurse Practitioner | Admitting: Nurse Practitioner

## 2016-04-28 DIAGNOSIS — D649 Anemia, unspecified: Secondary | ICD-10-CM | POA: Diagnosis not present

## 2016-04-28 LAB — HEMOGLOBIN AND HEMATOCRIT, BLOOD
HCT: 36.4 % (ref 36.0–46.0)
Hemoglobin: 11 g/dL — ABNORMAL LOW (ref 12.0–15.0)

## 2016-04-28 MED ORDER — DIPHENHYDRAMINE HCL 25 MG PO TABS
25.0000 mg | ORAL_TABLET | Freq: Once | ORAL | Status: AC
Start: 2016-04-28 — End: 2016-04-28
  Administered 2016-04-28: 25 mg via ORAL
  Filled 2016-04-28: qty 1

## 2016-04-28 MED ORDER — SODIUM CHLORIDE 0.9 % IV SOLN
Freq: Once | INTRAVENOUS | Status: AC
  Administered 2016-04-28: 500 mL via INTRAVENOUS

## 2016-04-28 MED ORDER — ACETAMINOPHEN 325 MG PO TABS
ORAL_TABLET | ORAL | Status: AC
  Filled 2016-04-28: qty 2

## 2016-04-28 MED ORDER — ACETAMINOPHEN 325 MG PO TABS
650.0000 mg | ORAL_TABLET | Freq: Once | ORAL | Status: AC
  Administered 2016-04-28: 650 mg via ORAL

## 2016-04-28 MED ORDER — DIPHENHYDRAMINE HCL 25 MG PO CAPS
ORAL_CAPSULE | ORAL | Status: AC
  Filled 2016-04-28: qty 1

## 2016-04-28 NOTE — Progress Notes (Signed)
Results for DAELIN, CICHY (MRN [id number]) as of 04/28/2016 11:27  Ref. Range 04/28/2016 11:18  Hemoglobin Latest Ref Range: 12.0 - 15.0 g/dL 11.0 (L)  HCT Latest Ref Range: 36.0 - 46.0 % 36.4   1 unit of PC given, tolerated well.  H&H post 1 hour

## 2016-04-28 NOTE — Discharge Instructions (Signed)
Blood Transfusion A blood transfusion is a procedure in which you are given blood through an IV tube. You may need this procedure because of:  Illness.  Surgery.  Injury.  The blood may come from someone else (a donor). You may also be able to donate blood for yourself (autologous blood donation). The blood given in a transfusion is made up of different types of cells. You may get:  Red blood cells. These carry oxygen to the cells in the body.  White blood cells. These help you fight infections.  Platelets. These help your blood to clot.  Plasma. This is the liquid part of your blood. It helps with fluid imbalances.  If you have a clotting disorder, you may also get other types of blood products. What happens before the procedure?  You will have a blood test to find out your blood type. The test also finds out what type of blood your body will accept and matches it to the donor type.  If you are going to have a planned surgery, you may be able to donate your own blood. This may be done in case you need a transfusion.  If you have had an allergic reaction to a transfusion in the past, you may be given medicine to help prevent a reaction. This medicine may be given to you by mouth or through an IV.  You will have your temperature, blood pressure, and pulse checked.  Follow instructions from your doctor about what you cannot eat or drink.  Ask your doctor about: ? Changing or stopping your regular medicines. This is important if you take diabetes medicines or blood thinners. ? Taking medicines such as aspirin and ibuprofen. These medicines can thin your blood. Do not take these medicines before your procedure if your doctor tells you not to. What happens during the procedure?  An IV tube will be put into one of your veins.  The bag of donated blood will be attached to your IV tube. Then, the blood will enter through your vein.  Your temperature, blood pressure, and pulse will be  checked regularly during the procedure. This is done to find early signs of a transfusion reaction.  If you have any signs or symptoms of a reaction, your transfusion will be stopped. You may also be given medicine.  When the transfusion is done, your IV tube will be taken out.  Pressure may be applied to the IV site for a few minutes.  A bandage (dressing) will be put on the IV site. The procedure may vary among doctors and hospitals. What happens after the procedure?  Your temperature, blood pressure, heart rate, breathing rate, and blood oxygen level will be checked often.  Your blood may be tested to see how you are responding to the transfusion.  You may be warmed with fluids or blankets. This is done to keep the temperature of your body normal. Summary  A blood transfusion is a procedure in which you are given blood through an IV tube.  The blood may come from someone else (a donor). You may also be able to donate blood for yourself.  If you have had an allergic reaction to a transfusion in the past, you may be given medicine to help prevent a reaction. This medicine may be given to you by mouth or through an IV tube.  Your temperature, blood pressure, heart rate, breathing rate, and blood oxygen level will be checked often.  Your blood may be tested to   see how you are responding to the transfusion. This information is not intended to replace advice given to you by your health care provider. Make sure you discuss any questions you have with your health care provider. Document Released: 06/27/2008 Document Revised: 11/23/2015 Document Reviewed: 11/23/2015 Elsevier Interactive Patient Education  2017 Elsevier Inc.  

## 2016-04-28 NOTE — Progress Notes (Signed)
Ferritin 34, low normal. Iron 10, low. sats low at 3. Patient to be set up for blood transfusion due to anemia. Routing to [employer] for [employer] on labs. I believe hematology will be seeing her in near future.

## 2016-04-29 LAB — TYPE AND SCREEN
ABO/RH(D): O POS
Antibody Screen: NEGATIVE
Unit division: 0

## 2016-05-01 ENCOUNTER — Ambulatory Visit: Payer: Medicare HMO | Admitting: Nurse Practitioner

## 2016-05-03 ENCOUNTER — Other Ambulatory Visit: Payer: Self-pay | Admitting: Gastroenterology

## 2016-05-03 DIAGNOSIS — K922 Gastrointestinal hemorrhage, unspecified: Secondary | ICD-10-CM

## 2016-05-03 DIAGNOSIS — K219 Gastro-esophageal reflux disease without esophagitis: Secondary | ICD-10-CM

## 2016-05-19 ENCOUNTER — Ambulatory Visit (HOSPITAL_COMMUNITY): Payer: Medicare HMO | Admitting: Hematology & Oncology

## 2016-05-20 ENCOUNTER — Ambulatory Visit: Payer: Medicare HMO | Admitting: Nurse Practitioner

## 2016-05-29 ENCOUNTER — Encounter (HOSPITAL_COMMUNITY): Payer: Self-pay | Admitting: Oncology

## 2016-05-29 ENCOUNTER — Encounter (HOSPITAL_COMMUNITY): Payer: Medicare HMO

## 2016-05-29 ENCOUNTER — Encounter (HOSPITAL_COMMUNITY): Payer: Medicare HMO | Attending: Nurse Practitioner | Admitting: Oncology

## 2016-05-29 DIAGNOSIS — D649 Anemia, unspecified: Secondary | ICD-10-CM | POA: Diagnosis not present

## 2016-05-29 DIAGNOSIS — I10 Essential (primary) hypertension: Secondary | ICD-10-CM

## 2016-05-29 DIAGNOSIS — E119 Type 2 diabetes mellitus without complications: Secondary | ICD-10-CM | POA: Diagnosis not present

## 2016-05-29 DIAGNOSIS — D509 Iron deficiency anemia, unspecified: Secondary | ICD-10-CM

## 2016-05-29 LAB — CBC WITH DIFFERENTIAL/PLATELET
BASOS PCT: 0 %
Basophils Absolute: 0 10*3/uL (ref 0.0–0.1)
Eosinophils Absolute: 0.1 10*3/uL (ref 0.0–0.7)
Eosinophils Relative: 1 %
HEMATOCRIT: 41.2 % (ref 36.0–46.0)
HEMOGLOBIN: 12.7 g/dL (ref 12.0–15.0)
LYMPHS PCT: 38 %
Lymphs Abs: 3.2 10*3/uL (ref 0.7–4.0)
MCH: 23.1 pg — ABNORMAL LOW (ref 26.0–34.0)
MCHC: 30.8 g/dL (ref 30.0–36.0)
MCV: 75 fL — ABNORMAL LOW (ref 78.0–100.0)
MONOS PCT: 8 %
Monocytes Absolute: 0.7 10*3/uL (ref 0.1–1.0)
NEUTROS ABS: 4.4 10*3/uL (ref 1.7–7.7)
NEUTROS PCT: 53 %
Platelets: 219 10*3/uL (ref 150–400)
RBC: 5.49 MIL/uL — ABNORMAL HIGH (ref 3.87–5.11)
RDW: 21.2 % — ABNORMAL HIGH (ref 11.5–15.5)
WBC: 8.4 10*3/uL (ref 4.0–10.5)

## 2016-05-29 LAB — BASIC METABOLIC PANEL
ANION GAP: 9 (ref 5–15)
BUN: 18 mg/dL (ref 6–20)
CHLORIDE: 99 mmol/L — AB (ref 101–111)
CO2: 30 mmol/L (ref 22–32)
CREATININE: 0.94 mg/dL (ref 0.44–1.00)
Calcium: 9.6 mg/dL (ref 8.9–10.3)
GFR calc non Af Amer: 57 mL/min — ABNORMAL LOW (ref >60)
Glucose, Bld: 94 mg/dL (ref 65–99)
POTASSIUM: 3.5 mmol/L (ref 3.5–5.1)
Sodium: 138 mmol/L (ref 135–145)

## 2016-05-29 LAB — FERRITIN: Ferritin: 15 ng/mL (ref 11–307)

## 2016-05-29 LAB — IRON AND TIBC
IRON: 62 ug/dL (ref 28–170)
SATURATION RATIOS: 14 % (ref 10.4–31.8)
TIBC: 451 ug/dL — AB (ref 250–450)
UIBC: 389 ug/dL

## 2016-05-29 LAB — FOLATE: Folate: 18.2 ng/mL (ref >5.9)

## 2016-05-29 LAB — VITAMIN B12: VITAMIN B 12: 252 pg/mL (ref 180–914)

## 2016-05-29 NOTE — Assessment & Plan Note (Addendum)
Microcytic, hypochromic anemia in December 2017 with iron studies concerning for iron deficiency anemia.  She is S/P EGD by Dr. 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  Colonoscopy by Dr. Gala Romney in 2012 demonstrated a normal rectum, colonic diverticulosis, and a single cecal polyp that was removed and negative for malignancy/dysplasia.  Labs today: CBC diff, BMET, anemia panel, SPEP + IFE, light chain assay, IgG, IgM, IgA, peripheral smear review, and stool cards x 3.  She is on antiplatelet therapy with ASA.  She is to continue this and educated that this can make her bleeding worse.  If she has iron deficiency anemia, we can certainly compensate for her GI blood loss.  She is provided patient education regarding iron deficiency anemia.  Iron deficiency anemia is the most common anemia.  Beside playing a critical role as an oxygen carrier in the heme group of hemoglobin, iron is found in many key proteins in the cells, such as cytochromes and myoglobin, so it is not unexpected that a lack of iron has effects other than anemia.  Three studies have focused on nonanemic iron deficiency leading to fatigue.  Two studies showed that oral iron supplementation reduces fatigue, with no significant change in hemoglobin levels, in women with a ferritin level of less than 50 ng/mL, and a third study showed a lessening of fatigue with parental iron administration in women with a ferritin level of 15 ng/mL or less or an iron saturation of 20% or less.   Owing to obligate iron loss through menses, women are at greater risk for iron deficiency than men.  Iron loss in all women averages 1-3 ng per day, and dietary intake is often inadequate to maintain a positive iron balance.  A 1967 study showed that 25% of healthy, college-age women had no bone marrow iron stores and that another 33% had low stores.  Pregnancy adds to demands for iron, with requirements  increasing to 6 ng per day by the end of pregnancy.  Athletes are another group at risk for iron deficiency.  Gastrointestinal tract blood is the source of iron loss, and exercise-induced hemolysis leads to urinary iron losses.  Decreased absorption of iron has also been implicated as a cause of iron deficiency, because of levels of hepcidin are often elevated in athletes owing to training-induced inflammation.    Obesity and its surgical treatment are also at risk factors for iron deficiency.  Obese patients are often iron-deficient, with increased hepcidin level being implicated in decreased absorption.  After bariatric surgery, the incidence of iron deficiency can be as high as 50%.  Because the main site of iron absorption is the duodenum, surgeries that involve bypassing this part of the bowel are associated with an increased incidence of iron deficiency.  However, iron deficiency is seen as a sequela of most types of bariatric surgery.    -NEJM Volume 371, No 14, pg [id number]  Labs in 4 weeks: CBC diff, BMET, iron/TIBC, ferritin.  She will return in 4 weeks for follow-up with repeat labs.  Addendum: Ferritin is low-normal and TIBC is elevated.  Calculated iron deficit is 203 mg.  Orders placed for 1 dose of IV feraheme 510 mg.

## 2016-05-29 NOTE — Progress Notes (Signed)
[facility] Hematology/Oncology Consultation   Name: Stacey Riley      MRN: [id number]    Location: Room/bed info not found  Date: 05/30/2016 Time:12:14 PM   REFERRING PHYSICIAN:  Walden Field, NP (GI)  REASON FOR CONSULT:  Anemia   DIAGNOSIS: Microcytic, hypochromic anemia with elevated RDW and preservation of WBC and platelet count.  HISTORY OF PRESENT ILLNESS:   Stacey Riley is a 76 y.o. female with a medical history significant for diabetes, hypertension, GERD, hyperlipidemia, and history of upper gastrointestinal bleed who is referred to the [facility] for microcytic, hypochromic anemia suspicious for iron deficiency anemia.  In November 2017 the patient was admitted to the hospital with an acute upper GI bleed.  She was evaluated by GI and underwent an EGD that showed [name] lesion(s).  She was managed with PPI twice a day and Carafate.  On 04/09/2016, hemoglobin is noted to be 8 g/dL.  She was transfused 1 unit of blood as an outpatient on 04/25/2016.  Her hemoglobin responded appropriately.  Patient reports she is doing very well.  She denies any complaints today.  She notes that her blood transfusion in January 2018 was the first she is ever received.  She notes that she has been anemic for many years even dating back to when she was young prior to marriage.  She denies any blood in her stools or recent dark stools.  She denies any chest pains or heart palpitations.  She denies any abdominal pain as well.  She denies any rash or abnormal bleeding.  Her breathing is at baseline and she denies any shortness of breath.  She does admit to "easy to get tired."  She denies any pica or pagophasia.  Interestingly, she reports a history of starch cravings.  Review of Systems  Constitutional: Positive for malaise/fatigue (fatigue with exertion). Negative for chills, fever and weight loss.  HENT: Negative.   Respiratory: Negative.  Negative for cough, sputum  production and shortness of breath.   Cardiovascular: Negative.  Negative for chest pain, palpitations and leg swelling.  Gastrointestinal: Negative.  Negative for abdominal pain, blood in stool and melena.  Genitourinary: Negative.   Musculoskeletal: Negative.  Negative for falls.  Skin: Negative.   Neurological: Negative.  Negative for weakness.  Endo/Heme/Allergies: Negative.  Does not bruise/bleed easily.  Psychiatric/Behavioral: Negative.     PAST MEDICAL HISTORY:   Past Medical History:  Diagnosis Date  . Anemia 03/14/2016  . Arthritis   . Asthma   . Bradycardia   . DM (diabetes mellitus) ([place])   . GERD (gastroesophageal reflux disease)   . High cholesterol   . HTN (hypertension)     ALLERGIES: Allergies  Allergen Reactions  . Augmentin [Amoxicillin-Pot Clavulanate] Nausea And Vomiting    hematemesis  . Sulfa Antibiotics Swelling      MEDICATIONS: I have reviewed the patient's current medications.    Current Outpatient Prescriptions on File Prior to Visit  Medication Sig Dispense Refill  . albuterol (PROAIR HFA) 108 (90 BASE) MCG/ACT inhaler Inhale 2 puffs into the lungs every 6 (six) hours as needed for wheezing or shortness of breath.    Marland Kitchen amLODipine (NORVASC) 10 MG tablet Take 10 mg by mouth every morning.     Marland Kitchen aspirin 81 MG tablet Take 81 mg by mouth. Takes once a week    . hydrochlorothiazide (HYDRODIURIL) 25 MG tablet Take 25 mg by mouth every morning.     Marland Kitchen  lisinopril (PRINIVIL,ZESTRIL) 40 MG tablet Take 40 mg by mouth every morning.     . loratadine (CLARITIN) 10 MG tablet Take 1 tablet (10 mg total) by mouth daily. 30 tablet 0  . pantoprazole (PROTONIX) 40 MG tablet Take 1 tablet (40 mg total) by mouth 2 (two) times daily before a meal. 180 tablet 1  . pravastatin (PRAVACHOL) 20 MG tablet Take 20 mg by mouth at bedtime.    . sucralfate (CARAFATE) 1 g tablet TAKE (1) TABLET BY MOUTH FOUR TIMES DAILY WITH MEALS AND AT BEDTIME. 120 tablet 1   No current  facility-administered medications on file prior to visit.      PAST SURGICAL HISTORY Past Surgical History:  Procedure Laterality Date  . CHOLECYSTECTOMY  1990s  . COLONOSCOPY  03/18/1999   Dr Rourk-int  hemorrhoids, pancolonic diverticula  . COLONOSCOPY WITH ESOPHAGOGASTRODUODENOSCOPY (EGD)  08/06/2011   Rourk: Schatzki ring, moderate sized hiatal hernia status post dilation for history of dysphagia. Pancolonic diverticula, single diminutive polyp at the base of the cecum removed (tubular adenoma). Next colonoscopy recommended for April 2018  . ESOPHAGOGASTRODUODENOSCOPY  07/02/01   Dr Rourk-Schatzki's ring s/p 80F maloney dilation, otherwise normal/small hiatal hernia/   Linear erosion and ulceration in the proximal stomach/  The ulcerated lesion in the proximal stomach  benign.This may be a [name] lesion secondary to trauma to the mucosa, straddling the  diaphragmatic hiatus in the presence of a hiatal hernia   . ESOPHAGOGASTRODUODENOSCOPY N/A 02/14/2016   Procedure: ESOPHAGOGASTRODUODENOSCOPY (EGD);  Surgeon: Daneil Dolin, MD;  [facility];  Service: Endoscopy;  Laterality: N/A;  Venia Minks DILATION  08/06/2011   Procedure: Venia Minks DILATION;  Surgeon: Daneil Dolin, MD;  [facility];  Service: Endoscopy;  Laterality: N/A;  . TUBAL LIGATION      FAMILY HISTORY: Family History  Problem Relation Age of Onset  . Aneurysm Daughter   . Stroke Daughter   . Diabetes Daughter   . Hypertension Mother   . Dementia Mother   . Hypertension Father   . Kidney cancer Brother   . Cancer Brother     brain  . Cancer Brother     bone  . Stroke Brother   . Hypertension Daughter   . Hypertension Son   . Hyperlipidemia Son   . Colon cancer Neg Hx   . Inflammatory bowel disease Neg Hx    She has 5 children.  One daughter has end-stage renal disease and is on hemodialysis.  Another daughter had a stroke.  Third daughter has hypertension. She has 18 grandchildren. Mother  is deceased at the age of 49 secondary to complications of dementia. Father's deceased at the age of 40 from aneurysm. She has 4 sisters.  She has 2 living brothers (5 brothers and total).  She notes 2 of her brothers are deceased from "bone cancer and brain cancer."  SOCIAL HISTORY:  reports that she has never smoked. She has never used smokeless tobacco. She reports that she does not drink alcohol or use drugs.  She is retired.  She used to work in the [employer].  She reports being [facility].  She is widowed 30 years.  Her husband passed away from lung cancer.  He was a smoker.  Social History   Social History  . Marital status: Divorced    Spouse name: N/A  . Number of children: 5  . Years of education: N/A   Occupational History  . retired; Charity fundraiser  Social History Main Topics  . Smoking status: Never Smoker  . Smokeless tobacco: Never Used  . Alcohol use No  . Drug use: No  . Sexual activity: Not Asked   Other Topics Concern  . None   Social History Narrative   Lives alone    PERFORMANCE STATUS: The patient's performance status is 1 - Symptomatic but completely ambulatory  PHYSICAL EXAM: Most Recent Vital Signs: Blood pressure (!) 159/56, pulse 79, resp. rate 16, height '5\' 4"'$  (1.626 m), weight 161 lb (73 kg), SpO2 100 %. General appearance: alert, cooperative, appears stated age, no distress, mildly obese and Accompanied by her son. Head: Normocephalic, without obvious abnormality, atraumatic Eyes: negative findings: lids and lashes normal, conjunctivae and sclerae normal and corneas clear Ears: normal TM and external ear canal left ear and Tympanic membrane not visualized in right ear secondary to excess cerumen Throat: lips, mucosa, and tongue normal; teeth and gums normal Neck: no adenopathy, supple, symmetrical, trachea midline and thyroid not enlarged, symmetric, no tenderness/mass/nodules Lungs: clear to auscultation bilaterally and normal percussion  bilaterally Heart: regular rate and rhythm, S1, S2 normal, no murmur, click, rub or gallop Abdomen: soft, non-tender; bowel sounds normal; no masses,  no organomegaly Extremities: extremities normal, atraumatic, no cyanosis or edema Skin: Skin color, texture, turgor normal. No rashes or lesions Lymph nodes: No cervical adenopathy Neurologic: Grossly normal  LABORATORY DATA:  Results for orders placed or performed in visit on 05/29/16 (from the past 48 hour(s))  CBC with Differential     Status: Abnormal   Collection Time: 05/29/16 12:40 PM  Result Value Ref Range   WBC 8.4 4.0 - 10.5 K/uL   RBC 5.49 (H) 3.87 - 5.11 MIL/uL   Hemoglobin 12.7 12.0 - 15.0 g/dL   HCT 41.2 36.0 - 46.0 %   MCV 75.0 (L) 78.0 - 100.0 fL   MCH 23.1 (L) 26.0 - 34.0 pg   MCHC 30.8 30.0 - 36.0 g/dL   RDW 21.2 (H) 11.5 - 15.5 %   Platelets 219 150 - 400 K/uL   Neutrophils Relative % 53 %   Lymphocytes Relative 38 %   Monocytes Relative 8 %   Eosinophils Relative 1 %   Basophils Relative 0 %   Neutro Abs 4.4 1.7 - 7.7 K/uL   Lymphs Abs 3.2 0.7 - 4.0 K/uL   Monocytes Absolute 0.7 0.1 - 1.0 K/uL   Eosinophils Absolute 0.1 0.0 - 0.7 K/uL   Basophils Absolute 0.0 0.0 - 0.1 K/uL   Smear Review PLATELET COUNT CONFIRMED BY SMEAR     Comment: LARGE PLATELETS PRESENT  Basic metabolic panel     Status: Abnormal   Collection Time: 05/29/16 12:40 PM  Result Value Ref Range   Sodium 138 135 - 145 mmol/L   Potassium 3.5 3.5 - 5.1 mmol/L   Chloride 99 (L) 101 - 111 mmol/L   CO2 30 22 - 32 mmol/L   Glucose, Bld 94 65 - 99 mg/dL   BUN 18 6 - 20 mg/dL   Creatinine, Ser 0.94 0.44 - 1.00 mg/dL   Calcium 9.6 8.9 - 10.3 mg/dL   GFR calc non Af Amer 57 (L) >60 mL/min   GFR calc Af Amer >60 >60 mL/min    Comment: (NOTE) The eGFR has been calculated using the CKD EPI equation. This calculation has not been validated in all clinical situations. eGFR's persistently <60 mL/min signify possible Chronic Kidney Disease.     Anion gap 9 5 - 15  Vitamin B12     Status: None   Collection Time: 05/29/16 12:40 PM  Result Value Ref Range   Vitamin B-12 252 180 - 914 pg/mL    Comment: (NOTE) This assay is not validated for testing neonatal or myeloproliferative syndrome specimens for Vitamin B12 levels. Performed at [facility], [address] [place], [place] [address]   Folate     Status: None   Collection Time: 05/29/16 12:40 PM  Result Value Ref Range   Folate 18.2 >5.9 ng/mL    Comment: Performed at [facility], [address] [place], [place] [address]  Iron and TIBC     Status: Abnormal   Collection Time: 05/29/16 12:40 PM  Result Value Ref Range   Iron 62 28 - 170 ug/dL   TIBC 918 (H) 112 - 793 ug/dL   Saturation Ratios 14 10.4 - 31.8 %   UIBC 389 ug/dL    Comment: Performed at [facility], [address] [place], [place] [address]  Ferritin     Status: None   Collection Time: 05/29/16 12:40 PM  Result Value Ref Range   Ferritin 15 11 - 307 ng/mL    Comment: Performed at [facility], [address], [place], [place] [address]  IgG, IgA, IgM     Status: Abnormal   Collection Time: 05/29/16 12:40 PM  Result Value Ref Range   IgG (Immunoglobin G), Serum 1,877 (H) 700 - 1,600 mg/dL   IgA 442 64 - 500 mg/dL   IgM, Serum 86 26 - 200 mg/dL    Comment: (NOTE) Performed At: [facility] [address] [place], [place] [id number] Mila Homer MD KW:[id number]       RADIOGRAPHY: No results found.     PATHOLOGY:  N/A   ASSESSMENT/PLAN:   Anemia Microcytic, hypochromic anemia in December 2017 with iron studies concerning for iron deficiency anemia.  She is S/P EGD by Dr. Jena Gauss on 02/14/2016 demonstrating [name] lesion(s) in stomach which is likely source of GI blood loss.  Colonoscopy by Dr. Jena Gauss in 2012 demonstrated a normal rectum, colonic diverticulosis, and a single cecal polyp that was removed and negative for malignancy/dysplasia.  Labs today: CBC  diff, BMET, anemia panel, SPEP + IFE, light chain assay, IgG, IgM, IgA, peripheral smear review, and stool cards x 3.  She is on antiplatelet therapy with ASA.  She is to continue this and educated that this can make her bleeding worse.  If she has iron deficiency anemia, we can certainly compensate for her GI blood loss.  She is provided patient education regarding iron deficiency anemia.  Iron deficiency anemia is the most common anemia.  Beside playing a critical role as an oxygen carrier in the heme group of hemoglobin, iron is found in many key proteins in the cells, such as cytochromes and myoglobin, so it is not unexpected that a lack of iron has effects other than anemia.  Three studies have focused on nonanemic iron deficiency leading to fatigue.  Two studies showed that oral iron supplementation reduces fatigue, with no significant change in hemoglobin levels, in women with a ferritin level of less than 50 ng/mL, and a third study showed a lessening of fatigue with parental iron administration in women with a ferritin level of 15 ng/mL or less or an iron saturation of 20% or less.   Owing to obligate iron loss through menses, women are at greater risk for iron deficiency than men.  Iron loss in all women averages 1-3 ng per day, and dietary intake is often inadequate to maintain a positive iron balance.  A 1967 study showed that 25% of healthy, college-age women had no bone marrow iron stores and that another 33% had low stores.  Pregnancy adds to demands for iron, with requirements increasing to 6 ng per day by the end of pregnancy.  Athletes are another group at risk for iron deficiency.  Gastrointestinal tract blood is the source of iron loss, and exercise-induced hemolysis leads to urinary iron losses.  Decreased absorption of iron has also been implicated as a cause of iron deficiency, because of levels of hepcidin are often elevated in athletes owing to training-induced inflammation.     Obesity and its surgical treatment are also at risk factors for iron deficiency.  Obese patients are often iron-deficient, with increased hepcidin level being implicated in decreased absorption.  After bariatric surgery, the incidence of iron deficiency can be as high as 50%.  Because the main site of iron absorption is the duodenum, surgeries that involve bypassing this part of the bowel are associated with an increased incidence of iron deficiency.  However, iron deficiency is seen as a sequela of most types of bariatric surgery.    -NEJM Volume 371, No 14, pg 1325-1326  Labs in 4 weeks: CBC diff, BMET, iron/TIBC, ferritin.  She will return in 4 weeks for follow-up with repeat labs.  Addendum: Ferritin is low-normal and TIBC is elevated.  Calculated iron deficit is 203 mg.  Orders placed for 1 dose of IV feraheme 510 mg.   ORDERS PLACED FOR THIS ENCOUNTER: Orders Placed This Encounter  Procedures  . CBC with Differential  . Basic metabolic panel  . Vitamin B12  . Folate  . Iron and TIBC  . Ferritin  . Kappa/lambda light chains  . IgG, IgA, IgM  . Immunofixation electrophoresis  . Protein electrophoresis, serum  . Pathologist smear review  . Occult blood card to lab, stool  . Occult blood card to lab, stool  . Occult blood card to lab, stool  . CBC with Differential  . Basic metabolic panel  . Iron and TIBC  . Ferritin    MEDICATIONS PRESCRIBED THIS ENCOUNTER: No orders of the defined types were placed in this encounter.   All questions were answered. The patient knows to call the clinic with any problems, questions or concerns. We can certainly see the patient much sooner if necessary.  Patient discussed with Dr. Talbert Cage and together we ascertained an up-to-date interval history, and examined the patient.  Dr. Talbert Cage developed the patient's assessment and plan.  This was a shared visit-consultation.  Her attestation will follow below.  This note is electronically signed  by: Doy Mince 05/30/2016 12:14 PM

## 2016-05-29 NOTE — Patient Instructions (Addendum)
[place] at [facility] Discharge Instructions  RECOMMENDATIONS MADE BY THE CONSULTANT AND ANY TEST RESULTS WILL BE SENT TO YOUR REFERRING PHYSICIAN.  You were seen today by Kirby Crigler PA-C and Dr. Talbert Cage. Labs today, we will call you with results. Stool cards given.  Return in 4 weeks for follow up.   Thank you for choosing [facility] at [facility] to provide your oncology and hematology care.  To afford each patient quality time with our provider, please arrive at least 15 minutes before your scheduled appointment time.    If you have a lab appointment with the [facility] please come in thru the  Main Entrance and check in at the main information desk  You need to re-schedule your appointment should you arrive 10 or more minutes late.  We strive to give you quality time with our providers, and arriving late affects you and other patients whose appointments are after yours.  Also, if you no show three or more times for appointments you may be dismissed from the clinic at the providers discretion.     Again, thank you for choosing [facility].  Our hope is that these requests will decrease the amount of time that you wait before being seen by our physicians.       _____________________________________________________________  Should you have questions after your visit to [facility], please contact our office at [phone] between the hours of 8:30 a.m. and 4:30 p.m.  Voicemails left after 4:30 p.m. will not be returned until the following business day.  For prescription refill requests, have your pharmacy contact our office.       Resources For Cancer Patients and their Caregivers ? American Cancer Society: Can assist with transportation, wigs, general needs, runs Look Good Feel Better.        949-220-7189 ? Cancer Care: Provides financial assistance, online support groups, medication/co-pay assistance.   1-800-813-HOPE 901 518 9771) ? [facility] Assists [place] cancer patients and their families through emotional , educational and financial support.  (520)748-4748 ? [place] DSS Where to apply for food stamps, Medicaid and utility assistance. (959)745-4741 ? RCATS: Transportation to medical appointments. 517-677-6412 ? Social Security Administration: May apply for disability if have a Stage IV cancer. 236-569-8216 (709)451-4544 ? [employer], Disability and Transit Services: Assists with nutrition, care and transit needs. [place] Support Programs: @10RELATIVEDAYS @ > Cancer Support Group  2nd Tuesday of the month 1pm-2pm, Journey Room  > Creative Journey  3rd Tuesday of the month 1130am-1pm, Journey Room  > Look Good Feel Better  1st Wednesday of the month 10am-12 noon, Journey Room (Call [facility] to register [phone])

## 2016-05-30 LAB — KAPPA/LAMBDA LIGHT CHAINS
Kappa free light chain: 20.5 mg/L — ABNORMAL HIGH (ref 3.3–19.4)
Kappa, lambda light chain ratio: 1 (ref 0.26–1.65)
Lambda free light chains: 20.5 mg/L (ref 5.7–26.3)

## 2016-05-30 LAB — IGG, IGA, IGM
IGA: 149 mg/dL (ref 64–422)
IGG (IMMUNOGLOBIN G), SERUM: 1877 mg/dL — AB (ref 700–1600)
IgM, Serum: 86 mg/dL (ref 26–217)

## 2016-05-30 LAB — PATHOLOGIST SMEAR REVIEW

## 2016-06-02 LAB — PROTEIN ELECTROPHORESIS, SERUM
A/G RATIO SPE: 0.9 (ref 0.7–1.7)
Albumin ELP: 3.9 g/dL (ref 2.9–4.4)
Alpha-1-Globulin: 0.3 g/dL (ref 0.0–0.4)
Alpha-2-Globulin: 1 g/dL (ref 0.4–1.0)
Beta Globulin: 1.1 g/dL (ref 0.7–1.3)
GLOBULIN, TOTAL: 4.3 g/dL — AB (ref 2.2–3.9)
Gamma Globulin: 1.9 g/dL — ABNORMAL HIGH (ref 0.4–1.8)
M-Spike, %: 1.1 g/dL — ABNORMAL HIGH
TOTAL PROTEIN ELP: 8.2 g/dL (ref 6.0–8.5)

## 2016-06-02 LAB — IMMUNOFIXATION ELECTROPHORESIS
IgA: 156 mg/dL (ref 64–422)
IgG (Immunoglobin G), Serum: 1885 mg/dL — ABNORMAL HIGH (ref 700–1600)
IgM, Serum: 83 mg/dL (ref 26–217)
Total Protein ELP: 8.2 g/dL (ref 6.0–8.5)

## 2016-06-06 ENCOUNTER — Other Ambulatory Visit (HOSPITAL_COMMUNITY): Payer: Self-pay | Admitting: *Deleted

## 2016-06-06 ENCOUNTER — Encounter (HOSPITAL_BASED_OUTPATIENT_CLINIC_OR_DEPARTMENT_OTHER): Payer: Medicare HMO

## 2016-06-06 ENCOUNTER — Encounter (HOSPITAL_COMMUNITY): Payer: Self-pay

## 2016-06-06 VITALS — BP 138/46 | HR 64 | Temp 98.3°F | Resp 18

## 2016-06-06 DIAGNOSIS — D509 Iron deficiency anemia, unspecified: Secondary | ICD-10-CM

## 2016-06-06 DIAGNOSIS — D649 Anemia, unspecified: Secondary | ICD-10-CM | POA: Diagnosis not present

## 2016-06-06 DIAGNOSIS — D500 Iron deficiency anemia secondary to blood loss (chronic): Secondary | ICD-10-CM

## 2016-06-06 LAB — OCCULT BLOOD X 1 CARD TO LAB, STOOL
FECAL OCCULT BLD: NEGATIVE
FECAL OCCULT BLD: NEGATIVE
Fecal Occult Bld: NEGATIVE

## 2016-06-06 MED ORDER — SODIUM CHLORIDE 0.9 % IV SOLN
510.0000 mg | Freq: Once | INTRAVENOUS | Status: AC
  Administered 2016-06-06: 510 mg via INTRAVENOUS
  Filled 2016-06-06: qty 17

## 2016-06-06 MED ORDER — SODIUM CHLORIDE 0.9% FLUSH: 3.0000 mL | Freq: Once | INTRAVENOUS | Status: DC | PRN

## 2016-06-06 MED ORDER — SODIUM CHLORIDE 0.9 % IV SOLN
Freq: Once | INTRAVENOUS | Status: AC
  Administered 2016-06-06: 15:00:00 via INTRAVENOUS

## 2016-06-06 NOTE — Progress Notes (Signed)
Tolerated infusion w/o adverse reaction.  Alert, in no distress.  VSS.  Discharged ambulatory.  

## 2016-06-06 NOTE — Patient Instructions (Signed)
[facility] at [facility] Discharge Instructions  RECOMMENDATIONS MADE BY THE CONSULTANT AND ANY TEST RESULTS WILL BE SENT TO YOUR REFERRING PHYSICIAN.  Iron infusion today. Return as scheduled.   Thank you for choosing [facility] at [facility] to provide your oncology and hematology care.  To afford each patient quality time with our provider, please arrive at least 15 minutes before your scheduled appointment time.    If you have a lab appointment with the Cancer Center please come in thru the  Main Entrance and check in at the main information desk  You need to re-schedule your appointment should you arrive 10 or more minutes late.  We strive to give you quality time with our providers, and arriving late affects you and other patients whose appointments are after yours.  Also, if you no show three or more times for appointments you may be dismissed from the clinic at the providers discretion.     Again, thank you for choosing [facility].  Our hope is that these requests will decrease the amount of time that you wait before being seen by our physicians.       _____________________________________________________________  Should you have questions after your visit to [facility], please contact our office at [phone] between the hours of 8:30 a.m. and 4:30 p.m.  Voicemails left after 4:30 p.m. will not be returned until the following business day.  For prescription refill requests, have your pharmacy contact our office.       Resources For Cancer Patients and their Caregivers ? American Cancer Society: Can assist with transportation, wigs, general needs, runs Look Good Feel Better.        1-888-227-6333 ? Cancer Care: Provides financial assistance, online support groups, medication/co-pay assistance.  1-800-813-HOPE (4673) ? [facility] Assists [place] cancer patients and their  families through emotional , educational and financial support.  336-427-4357 ? [place] DSS Where to apply for food stamps, Medicaid and utility assistance. 336-342-1394 ? RCATS: Transportation to medical appointments. 336-347-2287 ? Social Security Administration: May apply for disability if have a Stage IV cancer. 336-342-7796 1-800-772-1213 ? [place] [facility], Disability and Transit Services: Assists with nutrition, care and transit needs. 336-349-2343  Cancer Center Support Programs: @10RELATIVEDAYS@ > Cancer Support Group  2nd Tuesday of the month 1pm-2pm, Journey Room  > Creative Journey  3rd Tuesday of the month 1130am-1pm, Journey Room  > Look Good Feel Better  1st Wednesday of the month 10am-12 noon, Journey Room (Call American Cancer Society to register 1-800-395-5775)   

## 2016-06-12 ENCOUNTER — Ambulatory Visit: Payer: Medicare HMO | Admitting: Nurse Practitioner

## 2016-06-16 ENCOUNTER — Ambulatory Visit (INDEPENDENT_AMBULATORY_CARE_PROVIDER_SITE_OTHER): Payer: Medicare HMO | Admitting: Nurse Practitioner

## 2016-06-16 ENCOUNTER — Encounter: Payer: Self-pay | Admitting: Nurse Practitioner

## 2016-06-16 VITALS — BP 152/62 | HR 66 | Temp 98.3°F | Ht 64.0 in | Wt 171.6 lb

## 2016-06-16 DIAGNOSIS — D500 Iron deficiency anemia secondary to blood loss (chronic): Secondary | ICD-10-CM

## 2016-06-16 NOTE — Progress Notes (Signed)
Referring Provider: [facility] Primary Care Physician:  [facility] Primary GI:  Dr. Gala Romney  Chief Complaint  Patient presents with  . Anemia    HPI:   Stacey Riley is a 76 y.o. female who presents for follow-up on anemia. The patient was last seen in our office 04/18/2016 at which point a mid December 2017 note from primary care indicated recheck a CBC with a climbing file used 8.2. Repeat on 03/27/2016 showed stability at 8.3. Another follow-up 04/09/2016 showed mild drift 8.0. She was asymptomatic, iron studies had not been drawn as requested. At her last visit she stated she wasn't feeling well due to cold, some decreased energy and some dyspnea with frequent coughing. Denies hematochezia and melena. The patient agreed to have iron studies drawn the day of her last visit. Iron deficiency anemia noted in the setting of a Cameron ulcer and large hiatal hernia.   Ferritin was low normal at 34, iron 10, sats very low at 3. She was referred for blood transfusion as an outpatient.  She was seen by hematology 05/29/2016. At that time she told hematology she was doing very well with no complaints. She did eventually admit some fatigue with exertion. Tabs are ordered which showed low normal ferritin, TIBC elevated with a calculated iron deficit of 203 mg. She was scheduled for a dose of IV Feraheme which was completed on 06/06/2016.  Her last CBC completed 05/29/2016 showed improvement in hemoglobin to a normal value of 12.7.  Today she states she's doing well overall. Her energy is "a whole lot better" and she is able to clean and cook around the home; "I can get in my car and go!" Denies hematochezia, melena. Heme stool cards were negative. Denies abdominal pain, N/V, unintentional weight loss, fever, chills. Denies chest pain, dyspnea, dizziness, lightheadedness, syncope, near syncope. Denies any other upper or lower GI symptoms.  She is due for  colonoscopy in April of this year. She prefers to hold off on scheduling colonoscopy until it's due.   Past Medical History:  Diagnosis Date  . Anemia 03/14/2016  . Arthritis   . Asthma   . Bradycardia   . DM (diabetes mellitus) ([place])   . GERD (gastroesophageal reflux disease)   . High cholesterol   . HTN (hypertension)     Past Surgical History:  Procedure Laterality Date  . CHOLECYSTECTOMY  1990s  . COLONOSCOPY  03/18/1999   Dr Rourk-int  hemorrhoids, pancolonic diverticula  . COLONOSCOPY WITH ESOPHAGOGASTRODUODENOSCOPY (EGD)  08/06/2011   Rourk: Schatzki ring, moderate sized hiatal hernia status post dilation for history of dysphagia. Pancolonic diverticula, single diminutive polyp at the base of the cecum removed (tubular adenoma). Next colonoscopy recommended for April 2018  . ESOPHAGOGASTRODUODENOSCOPY  07/02/01   Dr Rourk-Schatzki's ring s/p 49F maloney dilation, otherwise normal/small hiatal hernia/   Linear erosion and ulceration in the proximal stomach/  The ulcerated lesion in the proximal stomach  benign.This may be a [name] lesion secondary to trauma to the mucosa, straddling the  diaphragmatic hiatus in the presence of a hiatal hernia   . ESOPHAGOGASTRODUODENOSCOPY N/A 02/14/2016   Procedure: ESOPHAGOGASTRODUODENOSCOPY (EGD);  Surgeon: Daneil Dolin, MD;  [facility];  Service: Endoscopy;  Laterality: N/A;  Venia Minks DILATION  08/06/2011   Procedure: Venia Minks DILATION;  Surgeon: Daneil Dolin, MD;  [facility];  Service: Endoscopy;  Laterality: N/A;  . TUBAL LIGATION  Current Outpatient Prescriptions  Medication Sig Dispense Refill  . albuterol (PROAIR HFA) 108 (90 BASE) MCG/ACT inhaler Inhale 2 puffs into the lungs every 6 (six) hours as needed for wheezing or shortness of breath.    Marland Kitchen amLODipine (NORVASC) 10 MG tablet Take 10 mg by mouth every morning.     Marland Kitchen aspirin 81 MG tablet Take 81 mg by mouth. Takes once a week    . hydrochlorothiazide  (HYDRODIURIL) 25 MG tablet Take 25 mg by mouth every morning.     Marland Kitchen lisinopril (PRINIVIL,ZESTRIL) 40 MG tablet Take 40 mg by mouth every morning.     . loratadine (CLARITIN) 10 MG tablet Take 1 tablet (10 mg total) by mouth daily. 30 tablet 0  . pantoprazole (PROTONIX) 40 MG tablet Take 1 tablet (40 mg total) by mouth 2 (two) times daily before a meal. 180 tablet 1  . pravastatin (PRAVACHOL) 20 MG tablet Take 20 mg by mouth at bedtime.    . sucralfate (CARAFATE) 1 g tablet TAKE (1) TABLET BY MOUTH FOUR TIMES DAILY WITH MEALS AND AT BEDTIME. 120 tablet 1   No current facility-administered medications for this visit.     Allergies as of 06/16/2016 - Review Complete 06/16/2016  Allergen Reaction Noted  . Augmentin [amoxicillin-pot clavulanate] Nausea And Vomiting 02/14/2016  . Sulfa antibiotics Swelling 07/14/2011    Family History  Problem Relation Age of Onset  . Aneurysm Daughter   . Stroke Daughter   . Diabetes Daughter   . Hypertension Mother   . Dementia Mother   . Hypertension Father   . Kidney cancer Brother   . Cancer Brother     brain  . Cancer Brother     bone  . Stroke Brother   . Hypertension Daughter   . Hypertension Son   . Hyperlipidemia Son   . Colon cancer Neg Hx   . Inflammatory bowel disease Neg Hx     Social History   Social History  . Marital status: Divorced    Spouse name: N/A  . Number of children: 5  . Years of education: N/A   Occupational History  . retired; Charity fundraiser    Social History Main Topics  . Smoking status: Never Smoker  . Smokeless tobacco: Never Used  . Alcohol use No  . Drug use: No  . Sexual activity: Not Asked   Other Topics Concern  . None   Social History Narrative   Lives alone    Review of Systems: General: Negative for anorexia, weight loss, fever, chills, fatigue, weakness. Eyes: Negative for vision changes.  ENT: Negative for hoarseness, difficulty swallowing , nasal congestion. CV: Negative for chest pain,  angina, palpitations, dyspnea on exertion, peripheral edema.  Respiratory: Negative for dyspnea at rest, dyspnea on exertion, cough, sputum, wheezing.  GI: See history of present illness. GU:  Negative for dysuria, hematuria, urinary incontinence, urinary frequency, nocturnal urination.  MS: Negative for joint pain, low back pain.  Derm: Negative for rash or itching.  Neuro: Negative for weakness, abnormal sensation, seizure, frequent headaches, memory loss, confusion.  Psych: Negative for anxiety, depression, suicidal ideation, hallucinations.  Endo: Negative for unusual weight change.  Heme: Negative for bruising or bleeding. Allergy: Negative for rash or hives.   Physical Exam: BP (!) 152/62   Pulse 66   Temp 98.3 F (36.8 C) (Oral)   Ht 5\' 4"  (1.626 m)   Wt 171 lb 9.6 oz (77.8 kg)   BMI 29.46 kg/m  General:  Alert and oriented. Pleasant and cooperative. Well-nourished and well-developed.  Head:  Normocephalic and atraumatic. Eyes:  Without icterus, sclera clear and conjunctiva pink.  Ears:  Normal auditory acuity. Mouth:  No deformity or lesions, oral mucosa pink.  Throat/Neck:  Supple, without mass or thyromegaly. Cardiovascular:  S1, S2 present without murmurs appreciated. Normal pulses noted. Extremities without clubbing or edema. Respiratory:  Clear to auscultation bilaterally. No wheezes, rales, or rhonchi. No distress.  Gastrointestinal:  +BS, soft, non-tender and non-distended. No HSM noted. No guarding or rebound. No masses appreciated.  Rectal:  Deferred  Musculoskalatal:  Symmetrical without gross deformities. Normal posture. Skin:  Intact without significant lesions or rashes. Neurologic:  Alert and oriented x4;  grossly normal neurologically. Psych:  Alert and cooperative. Normal mood and affect. Heme/Lymph/Immune: No significant cervical adenopathy. No excessive bruising noted.    06/16/2016 11:11 AM   Disclaimer: This note was dictated with voice  recognition software. Similar sounding words can inadvertently be transcribed and may not be corrected upon review.

## 2016-06-16 NOTE — Progress Notes (Signed)
cc'ed to pcp °

## 2016-06-16 NOTE — Assessment & Plan Note (Signed)
Symptomatically she is much improved. She is seen hematology and received an iron infusion. Recommend she continue to follow hematology as recommended. She will be due for colonoscopy and a couple months and would prefer to wait to schedule that until it is due. Return for follow-up in 2 months to schedule colonoscopy.

## 2016-06-16 NOTE — Patient Instructions (Signed)
1. Continue to see hematology for ongoing anemia treatment and monitoring. 2. Return for follow-up in 2 months so that we can see you in order to schedule follow-up colonoscopy. 3. Call if any questions or worsening problems.

## 2016-06-30 ENCOUNTER — Encounter (HOSPITAL_COMMUNITY): Payer: Medicare HMO

## 2016-06-30 ENCOUNTER — Encounter (HOSPITAL_COMMUNITY): Payer: Self-pay | Admitting: Hematology

## 2016-06-30 ENCOUNTER — Encounter (HOSPITAL_COMMUNITY): Payer: Medicare HMO | Attending: Hematology | Admitting: Hematology

## 2016-06-30 VITALS — BP 159/65 | HR 54 | Temp 97.6°F | Resp 16 | Wt 173.9 lb

## 2016-06-30 DIAGNOSIS — D472 Monoclonal gammopathy: Secondary | ICD-10-CM | POA: Diagnosis not present

## 2016-06-30 DIAGNOSIS — D649 Anemia, unspecified: Secondary | ICD-10-CM

## 2016-06-30 DIAGNOSIS — D500 Iron deficiency anemia secondary to blood loss (chronic): Secondary | ICD-10-CM | POA: Diagnosis not present

## 2016-06-30 LAB — CBC WITH DIFFERENTIAL/PLATELET
BASOS ABS: 0 10*3/uL (ref 0.0–0.1)
Basophils Relative: 0 %
Eosinophils Absolute: 0.1 10*3/uL (ref 0.0–0.7)
Eosinophils Relative: 2 %
HEMATOCRIT: 40 % (ref 36.0–46.0)
HEMOGLOBIN: 12.6 g/dL (ref 12.0–15.0)
LYMPHS ABS: 3.3 10*3/uL (ref 0.7–4.0)
Lymphocytes Relative: 48 %
MCH: 24.9 pg — ABNORMAL LOW (ref 26.0–34.0)
MCHC: 31.5 g/dL (ref 30.0–36.0)
MCV: 78.9 fL (ref 78.0–100.0)
Monocytes Absolute: 0.6 10*3/uL (ref 0.1–1.0)
Monocytes Relative: 8 %
Neutro Abs: 2.9 10*3/uL (ref 1.7–7.7)
Neutrophils Relative %: 42 %
Platelets: 202 10*3/uL (ref 150–400)
RBC: 5.07 MIL/uL (ref 3.87–5.11)
RDW: 23 % — AB (ref 11.5–15.5)
WBC: 6.9 10*3/uL (ref 4.0–10.5)

## 2016-06-30 LAB — BASIC METABOLIC PANEL
ANION GAP: 8 (ref 5–15)
BUN: 19 mg/dL (ref 6–20)
CO2: 28 mmol/L (ref 22–32)
CREATININE: 1.09 mg/dL — AB (ref 0.44–1.00)
Calcium: 9 mg/dL (ref 8.9–10.3)
Chloride: 97 mmol/L — ABNORMAL LOW (ref 101–111)
GFR, EST AFRICAN AMERICAN: 56 mL/min — AB (ref >60)
GFR, EST NON AFRICAN AMERICAN: 48 mL/min — AB (ref >60)
GLUCOSE: 104 mg/dL — AB (ref 65–99)
Potassium: 3.6 mmol/L (ref 3.5–5.1)
Sodium: 133 mmol/L — ABNORMAL LOW (ref 135–145)

## 2016-06-30 LAB — FERRITIN: Ferritin: 148 ng/mL (ref 11–307)

## 2016-06-30 LAB — IRON AND TIBC
Iron: 71 ug/dL (ref 28–170)
Saturation Ratios: 22 % (ref 10.4–31.8)
TIBC: 318 ug/dL (ref 250–450)
UIBC: 247 ug/dL

## 2016-06-30 NOTE — Patient Instructions (Addendum)
[place] at [facility] Discharge Instructions  RECOMMENDATIONS MADE BY THE CONSULTANT AND ANY TEST RESULTS WILL BE SENT TO YOUR REFERRING PHYSICIAN.  You were seen today by Dr. Irene Limbo Follow up in 4 months with labs. See Amy up front for appointments   Thank you for choosing [place] at [facility] to provide your oncology and hematology care.  To afford each patient quality time with our provider, please arrive at least 15 minutes before your scheduled appointment time.    If you have a lab appointment with the [facility] please come in thru the  Main Entrance and check in at the main information desk  You need to re-schedule your appointment should you arrive 10 or more minutes late.  We strive to give you quality time with our providers, and arriving late affects you and other patients whose appointments are after yours.  Also, if you no show three or more times for appointments you may be dismissed from the clinic at the providers discretion.     Again, thank you for choosing [facility].  Our hope is that these requests will decrease the amount of time that you wait before being seen by our physicians.       _____________________________________________________________  Should you have questions after your visit to [facility], please contact our office at [phone] between the hours of 8:30 a.m. and 4:30 p.m.  Voicemails left after 4:30 p.m. will not be returned until the following business day.  For prescription refill requests, have your pharmacy contact our office.       Resources For Cancer Patients and their Caregivers ? American Cancer Society: Can assist with transportation, wigs, general needs, runs Look Good Feel Better.        (518)356-6790 ? Cancer Care: Provides financial assistance, online support groups, medication/co-pay assistance.  1-800-813-HOPE 701-018-1406) ? [place] Assists [place] cancer patients and their families through emotional , educational and financial support.  413-207-7335 ? [place] DSS Where to apply for food stamps, Medicaid and utility assistance. (703)629-9476 ? [employer]: Transportation to medical appointments. 754-665-8259 ? Social Security Administration: May apply for disability if have a Stage IV cancer. (570)374-4120 (463)265-5215 ? [employer], Disability and Transit Services: Assists with nutrition, care and transit needs. [place] Support Programs: @10RELATIVEDAYS @ > Cancer Support Group  2nd Tuesday of the month 1pm-2pm, Journey Room  > Creative Journey  3rd Tuesday of the month 1130am-1pm, Journey Room  > Look Good Feel Better  1st Wednesday of the month 10am-12 noon, Journey Room (Call [place] to register [phone])

## 2016-07-02 NOTE — Progress Notes (Signed)
[facility]  HEMATOLOGY ONCOLOGY PROGRESS NOTE  Date of service: .06/30/2016  Patient Care Team: [facility] as PCP - General Daneil Dolin, MD (Gastroenterology)  CC: f/u for Anemia  Diagnosis: Iron deficiency Anemia  Current Treatment: IV iron as needed   INTERVAL HISTORY:  Patient is here for follow-up of her iron deficiency anemia after her last clinic visit on 05/29/2016. She received 1 dose of IV Feraheme 510 mg. Her blood counts today show normal hemoglobin of 12.6, improving MCV. Ferritin level has now normalized at 148. No overt bleeding noted. FOBT was positive four months ago but negative about 3 weeks ago.  REVIEW OF SYSTEMS:    10 Point review of systems of done and is negative except as noted above.  . Past Medical History:  Diagnosis Date  . Anemia 03/14/2016  . Arthritis   . Asthma   . Bradycardia   . DM (diabetes mellitus) ([place])   . GERD (gastroesophageal reflux disease)   . High cholesterol   . HTN (hypertension)     . Past Surgical History:  Procedure Laterality Date  . CHOLECYSTECTOMY  1990s  . COLONOSCOPY  03/18/1999   Dr Rourk-int  hemorrhoids, pancolonic diverticula  . COLONOSCOPY WITH ESOPHAGOGASTRODUODENOSCOPY (EGD)  08/06/2011   Rourk: Schatzki ring, moderate sized hiatal hernia status post dilation for history of dysphagia. Pancolonic diverticula, single diminutive polyp at the base of the cecum removed (tubular adenoma). Next colonoscopy recommended for April 2018  . ESOPHAGOGASTRODUODENOSCOPY  07/02/01   Dr Rourk-Schatzki's ring s/p 33F maloney dilation, otherwise normal/small hiatal hernia/   Linear erosion and ulceration in the proximal stomach/  The ulcerated lesion in the proximal stomach  benign.This may be a [name] lesion secondary to trauma to the mucosa, straddling the  diaphragmatic hiatus in the presence of a hiatal hernia   . ESOPHAGOGASTRODUODENOSCOPY N/A 02/14/2016   Procedure: ESOPHAGOGASTRODUODENOSCOPY (EGD);   Surgeon: Daneil Dolin, MD;  [facility];  Service: Endoscopy;  Laterality: N/A;  Venia Minks DILATION  08/06/2011   Procedure: Venia Minks DILATION;  Surgeon: Daneil Dolin, MD;  [facility];  Service: Endoscopy;  Laterality: N/A;  . TUBAL LIGATION      . Social History  Substance Use Topics  . Smoking status: Never Smoker  . Smokeless tobacco: Never Used  . Alcohol use No    ALLERGIES:  is allergic to augmentin [amoxicillin-pot clavulanate] and sulfa antibiotics.  MEDICATIONS:  Current Outpatient Prescriptions  Medication Sig Dispense Refill  . albuterol (PROAIR HFA) 108 (90 BASE) MCG/ACT inhaler Inhale 2 puffs into the lungs every 6 (six) hours as needed for wheezing or shortness of breath.    Marland Kitchen amLODipine (NORVASC) 10 MG tablet Take 10 mg by mouth every morning.     Marland Kitchen aspirin 81 MG tablet Take 81 mg by mouth. Takes once a week    . hydrochlorothiazide (HYDRODIURIL) 25 MG tablet Take 25 mg by mouth every morning.     Marland Kitchen lisinopril (PRINIVIL,ZESTRIL) 40 MG tablet Take 40 mg by mouth every morning.     . loratadine (CLARITIN) 10 MG tablet Take 1 tablet (10 mg total) by mouth daily. 30 tablet 0  . pantoprazole (PROTONIX) 40 MG tablet Take 1 tablet (40 mg total) by mouth 2 (two) times daily before a meal. 180 tablet 1  . pravastatin (PRAVACHOL) 20 MG tablet Take 20 mg by mouth at bedtime.    . sucralfate (CARAFATE) 1 g tablet TAKE (1) TABLET BY MOUTH  FOUR TIMES DAILY WITH MEALS AND AT BEDTIME. 120 tablet 1   No current facility-administered medications for this visit.     PHYSICAL EXAMINATION: ECOG PERFORMANCE STATUS: 2 - Symptomatic, <50% confined to bed  . Vitals:   06/30/16 1618  BP: (!) 159/65  Pulse: (!) 54  Resp: 16  Temp: 97.6 F (36.4 C)    Filed Weights   06/30/16 1618  Weight: 173 lb 14.4 oz (78.9 kg)   .Body mass index is 29.85 kg/m.  GENERAL:alert, in no acute distress and comfortable SKIN: no acute rashes, no significant  lesions EYES: conjunctiva are pink and non-injected, sclera anicteric OROPHARYNX: MMM, no exudates, no oropharyngeal erythema or ulceration NECK: supple, no JVD LYMPH:  no palpable lymphadenopathy in the cervical, axillary or inguinal regions LUNGS: clear to auscultation b/l with normal respiratory effort HEART: regular rate & rhythm ABDOMEN:  normoactive bowel sounds , non tender, not distended. Extremity: no pedal edema PSYCH: alert & oriented x 3 with fluent speech NEURO: no focal motor/sensory deficits  LABORATORY DATA:   I have reviewed the data as listed  . CBC Latest Ref Rng & Units 06/30/2016 05/29/2016 04/28/2016  WBC 4.0 - 10.5 K/uL 6.9 8.4 -  Hemoglobin 12.0 - 15.0 g/dL 12.6 12.7 11.0(L)  Hematocrit 36.0 - 46.0 % 40.0 41.2 36.4  Platelets 150 - 400 K/uL 202 219 -    . CMP Latest Ref Rng & Units 06/30/2016 05/29/2016 02/21/2016  Glucose 65 - 99 mg/dL 104(H) 94 135(H)  BUN 6 - 20 mg/dL '19 18 16  '$ Creatinine 0.44 - 1.00 mg/dL 1.09(H) 0.94 1.01(H)  Sodium 135 - 145 mmol/L 133(L) 138 133(L)  Potassium 3.5 - 5.1 mmol/L 3.6 3.5 3.1(L)  Chloride 101 - 111 mmol/L 97(L) 99(L) 96(L)  CO2 22 - 32 mmol/L '28 30 27  '$ Calcium 8.9 - 10.3 mg/dL 9.0 9.6 9.1  Total Protein 6.5 - 8.1 g/dL - - 8.1  Total Bilirubin 0.3 - 1.2 mg/dL - - 0.5  Alkaline Phos 38 - 126 U/L - - 81  AST 15 - 41 U/L - - 34  ALT 14 - 54 U/L - - 42   . Lab Results  Component Value Date   IRON 71 06/30/2016   TIBC 318 06/30/2016   IRONPCTSAT 22 06/30/2016   (Iron and TIBC)  Lab Results  Component Value Date   FERRITIN 148 06/30/2016   Component     Latest Ref Rng & Units 05/29/2016 05/29/2016 05/29/2016        12:40 PM 12:40 PM 12:40 PM  Total Protein ELP     6.0 - 8.5 g/dL  8.2 8.2  Albumin ELP     2.9 - 4.4 g/dL   3.9  Alpha-1-Globulin     0.0 - 0.4 g/dL   0.3  Alpha-2-Globulin     0.4 - 1.0 g/dL   1.0  Beta Globulin     0.7 - 1.3 g/dL   1.1  Gamma Globulin     0.4 - 1.8 g/dL   1.9 (H)  M-SPIKE,  %     Not Observed g/dL   1.1 (H)  SPE Interp.        Comment  Comment        Comment  Globulin, Total     2.2 - 3.9 g/dL   4.3 (H)  A/G Ratio     0.7 - 1.7   0.9  IgG (Immunoglobin G), Serum     700 - 1,600 mg/dL 1,877 (H)  1,885 (H)   IgA     64 - 422 mg/dL 149 156   IgM, Serum     26 - 217 mg/dL 86 83   Immunofixation Result, Serum       Comment   Kappa free light chain     3.3 - 19.4 mg/L 20.5 (H)    Lamda free light chains     5.7 - 26.3 mg/L 20.5    Kappa, lamda light chain ratio     0.26 - 1.65 1.00      Immunofixation electrophoresis      The value has a corrected status.      No reference range information available      Resulting Lab: [facility]      Comments:           (NOTE)           Immunofixation shows IgG monoclonal protein with lambda            light chain           specificity.  RADIOGRAPHIC STUDIES: I have personally reviewed the radiological images as listed and agreed with the findings in the report. No results found.  ASSESSMENT & PLAN:   76 year old female with   #1 Microcytic hypochromic anemia with elevated RDW . Seems to be primarily related to iron deficiency .  Recent GI workup S/P EGD by Dr. 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  Colonoscopy by Dr. Gala Romney in 2012 demonstrated a normal rectum, colonic diverticulosis, and a single cecal polyp that was removed and negative for malignancy/dysplasia.  Patient's hemoglobin is within normal limits now and she has improving microcytosis after her recent IV Feraheme . Fecal occult blood testing done recently is negative  Patient is on PPI twice a day and sucralfate to help her [name] ulcers heal though these represent an ongoing concern for recurrent GI blood losses . Plan  -She is currently continuing aspirin . The risks versus benefits of this would need to be reevaluated if she has ongoing GI bleeding and drop in hemoglobin  levels . -Patient's ferritin is 148. We will hold off on additional IV iron at this time.   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 Patient had her SPEP done on 05/29/2016 that showed an IgG level of 1900  with an M protein of 1.1 g/dL  Normal K/L free light chain ratio . No significant anemia at this time, no hypercalcemia or change in kidney function and no focal new bone pains . Plan  -Repeat labs in 3-4 months.  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  Return to care in 4 months with rpt labs to monitor IDA and MGUS  I spent 20 minutes counseling the patient face to face. The total time spent in the appointment was 25 minutes and more than 50% was on counseling and direct patient cares.    Sullivan Lone MD [place] AAHIVMS [facility] Hematology/Oncology Physician [facility]  (Office):       [phone] (Work cell):  [phone] (Fax):           [phone]

## 2016-07-18 ENCOUNTER — Other Ambulatory Visit: Payer: Self-pay | Admitting: Nurse Practitioner

## 2016-07-18 DIAGNOSIS — K219 Gastro-esophageal reflux disease without esophagitis: Secondary | ICD-10-CM

## 2016-07-18 DIAGNOSIS — K922 Gastrointestinal hemorrhage, unspecified: Secondary | ICD-10-CM

## 2016-08-19 ENCOUNTER — Encounter: Payer: Self-pay | Admitting: Nurse Practitioner

## 2016-08-19 ENCOUNTER — Other Ambulatory Visit: Payer: Self-pay

## 2016-08-19 ENCOUNTER — Ambulatory Visit (INDEPENDENT_AMBULATORY_CARE_PROVIDER_SITE_OTHER): Payer: Medicare HMO | Admitting: Nurse Practitioner

## 2016-08-19 VITALS — BP 160/72 | HR 60 | Temp 97.5°F | Ht 64.0 in | Wt 176.2 lb

## 2016-08-19 DIAGNOSIS — Z86010 Personal history of colonic polyps: Secondary | ICD-10-CM | POA: Diagnosis not present

## 2016-08-19 DIAGNOSIS — D500 Iron deficiency anemia secondary to blood loss (chronic): Secondary | ICD-10-CM

## 2016-08-19 MED ORDER — PEG 3350-KCL-NA BICARB-NACL 420 G PO SOLR: 4000.0000 mL | ORAL | 0 refills | Status: DC

## 2016-08-19 NOTE — Progress Notes (Signed)
Referring Provider: [facility]* Primary Care Physician:  [facility]:  Dr. Gala Romney  Chief Complaint  Patient presents with  . Anemia    f/u  . Colonoscopy    due for 5 yr, no problems    HPI:   Stacey Riley is a 76 y.o. female who presents for follow-up on anemia, due for 5 year colonoscopy. The patient was last seen in our office 06/16/2016 for history of chronic anemia. History of Cameron ulcer and large hiatal hernia. Iron studies were initially very low. She has been seen hematology and received an IV iron infusion. At her last visit with [place] her energy was "a whole lot better" and was noted that she is due for a colonoscopy. Heme stool cards were negative. She preferred to wait the additional 2 months to schedule her exam.  Last heme/onc visit 06/30/16 with normal hgb *12.9), improving MCV, normal Ferritin 148.  Last upper endoscopy completed 08/06/2011 which found Schatzki's ring status post dilation, hiatal hernia. Recommended continue PPI. Colonoscopy completed the same day found normal rectum, colonic diverticulosis, single cecal polyp status post removal. Polyp noted to be tubular adenoma. Recommended 5 year repeat colonoscopy (April 2018).  Today she states she's doing well, energy is good. Denies abdominal pain, N/V, hematochezia, melena, acute changes in bowel habing, unintentional weight loss. Denies chest pain, dyspnea, dizziness, lightheadedness, syncope, near syncope. Denies any other upper or lower GI symptoms.  Past Medical History:  Diagnosis Date  . Anemia 03/14/2016  . Arthritis   . Asthma   . Bradycardia   . DM (diabetes mellitus) ([place])   . GERD (gastroesophageal reflux disease)   . High cholesterol   . HTN (hypertension)     Past Surgical History:  Procedure Laterality Date  . CHOLECYSTECTOMY  1990s  . COLONOSCOPY  03/18/1999   Dr Rourk-int  hemorrhoids, pancolonic diverticula  . COLONOSCOPY WITH  ESOPHAGOGASTRODUODENOSCOPY (EGD)  08/06/2011   Rourk: Schatzki ring, moderate sized hiatal hernia status post dilation for history of dysphagia. Pancolonic diverticula, single diminutive polyp at the base of the cecum removed (tubular adenoma). Next colonoscopy recommended for April 2018  . ESOPHAGOGASTRODUODENOSCOPY  07/02/01   Dr Rourk-Schatzki's ring s/p 71F maloney dilation, otherwise normal/small hiatal hernia/   Linear erosion and ulceration in the proximal stomach/  The ulcerated lesion in the proximal stomach  benign.This may be a [name] lesion secondary to trauma to the mucosa, straddling the  diaphragmatic hiatus in the presence of a hiatal hernia   . ESOPHAGOGASTRODUODENOSCOPY N/A 02/14/2016   Procedure: ESOPHAGOGASTRODUODENOSCOPY (EGD);  Surgeon: Daneil Dolin, MD;  [facility];  Service: Endoscopy;  Laterality: N/A;  Venia Minks DILATION  08/06/2011   Procedure: Venia Minks DILATION;  Surgeon: Daneil Dolin, MD;  Location: [facility];  Service: Endoscopy;  Laterality: N/A;  . TUBAL LIGATION      Current Outpatient Prescriptions  Medication Sig Dispense Refill  . albuterol (PROAIR HFA) 108 (90 BASE) MCG/ACT inhaler Inhale 2 puffs into the lungs every 6 (six) hours as needed for wheezing or shortness of breath.    Marland Kitchen amLODipine (NORVASC) 10 MG tablet Take 10 mg by mouth every morning.     Marland Kitchen aspirin 81 MG tablet Take 81 mg by mouth. Takes once a week    . hydrochlorothiazide (HYDRODIURIL) 25 MG tablet Take 25 mg by mouth every morning.     Marland Kitchen lisinopril (PRINIVIL,ZESTRIL) 40 MG tablet Take 20 mg  by mouth every morning.     . loratadine (CLARITIN) 10 MG tablet Take 1 tablet (10 mg total) by mouth daily. 30 tablet 0  . pantoprazole (PROTONIX) 40 MG tablet Take 1 tablet (40 mg total) by mouth 2 (two) times daily before a meal. 180 tablet 1  . pravastatin (PRAVACHOL) 20 MG tablet Take 20 mg by mouth at bedtime.    . sucralfate (CARAFATE) 1 g tablet TAKE (1) TABLET BY MOUTH FOUR  TIMES DAILY WITH MEALS AND AT BEDTIME. 120 tablet 0   No current facility-administered medications for this visit.     Allergies as of 08/19/2016 - Review Complete 08/19/2016  Allergen Reaction Noted  . Augmentin [amoxicillin-pot clavulanate] Nausea And Vomiting 02/14/2016  . Sulfa antibiotics Swelling 07/14/2011    Family History  Problem Relation Age of Onset  . Aneurysm Daughter   . Stroke Daughter   . Diabetes Daughter   . Hypertension Mother   . Dementia Mother   . Hypertension Father   . Kidney cancer Brother   . Cancer Brother     brain  . Cancer Brother     bone  . Stroke Brother   . Hypertension Daughter   . Hypertension Son   . Hyperlipidemia Son   . Colon cancer Neg Hx   . Inflammatory bowel disease Neg Hx     Social History   Social History  . Marital status: Divorced    Spouse name: N/A  . Number of children: 5  . Years of education: N/A   Occupational History  . retired; Charity fundraiser    Social History Main Topics  . Smoking status: Never Smoker  . Smokeless tobacco: Never Used  . Alcohol use No  . Drug use: No  . Sexual activity: Not Asked   Other Topics Concern  . None   Social History Narrative   Lives alone    Review of Systems: General: Negative for anorexia, weight loss, fever, chills, fatigue, weakness. ENT: Negative for hoarseness, difficulty swallowing. CV: Negative for chest pain, angina, palpitations, peripheral edema.  Respiratory: Negative for dyspnea at rest, cough, sputum, wheezing.  GI: See history of present illness. Endo: Negative for unusual weight change.  Heme: Negative for bruising or bleeding. Allergy: Negative for rash or hives.   Physical Exam: BP (!) 160/72   Pulse 60   Temp 97.5 F (36.4 C) (Oral)   Ht 5\' 4"  (1.626 m)   Wt 176 lb 3.2 oz (79.9 kg)   BMI 30.24 kg/m  General:   Alert and oriented. Pleasant and cooperative. Well-nourished and well-developed.  Head:  Normocephalic and atraumatic. Eyes:   Without icterus, sclera clear and conjunctiva pink.  Ears:  Normal auditory acuity. Cardiovascular:  S1, S2 present without murmurs appreciated. Normal pulses noted. Respiratory:  Clear to auscultation bilaterally. No wheezes, rales, or rhonchi. No distress.  Gastrointestinal:  +BS, rounded but soft, non-tender and non-distended. No HSM noted. No guarding or rebound. No masses appreciated.  Rectal:  Deferred  Musculoskalatal:  Symmetrical without gross deformities. Neurologic:  Alert and oriented x4;  grossly normal neurologically. Psych:  Alert and cooperative. Normal mood and affect. Heme/Lymph/Immune: No excessive bruising noted.    08/19/2016 10:15 AM   Disclaimer: This note was dictated with voice recognition software. Similar sounding words can inadvertently be transcribed and may not be corrected upon review.

## 2016-08-19 NOTE — Assessment & Plan Note (Signed)
Continues to see hematology and anemia is doing quite well after IV iron replacement. Her last hemoglobin and iron studies were both normal. Recommend continue to see hematology. Return for follow-up in one year. Call with any questions or worsening/recurrent symptoms.

## 2016-08-19 NOTE — Progress Notes (Signed)
cc'ed to pcp °

## 2016-08-19 NOTE — Patient Instructions (Signed)
She does not need PA for TCS  Case# [id number]

## 2016-08-19 NOTE — Patient Instructions (Signed)
1. We will schedule your procedure for you. 2. Return for follow-up in one year, or sooner based on recommendations made after your procedure. 3. Call with any questions, concerns, or worsening symptoms.

## 2016-08-19 NOTE — Assessment & Plan Note (Signed)
Last colonoscopy 5 years ago with 5 colon polyps noted to be tubular adenoma. Recommended 5 year repeat colonoscopy. She is currently due. At this point she is generally asymptomatic from a GI standpoint. We will proceed with previously recommended a colonoscopy. Return for follow-up in one year, or sooner based on postprocedure recommendations.  Proceed with TCS with Dr. Gala Romney in near future: the risks, benefits, and alternatives have been discussed with the patient in detail. The patient states understanding and desires to proceed.  The patient is not currently on any anticoagulants, anxiolytics, chronic pain medications, or antidepressants. Conscious sedation should be adequate for her procedure as it was for her last.

## 2016-09-18 ENCOUNTER — Telehealth: Payer: Self-pay

## 2016-09-18 NOTE — Telephone Encounter (Signed)
Called pt. TCS with RMR for 09/26/16 moved to 11:15am. Pt said she was planning on arriving at 10:00am anyway. Advised to start drinking 2nd half of prep at 6:15am that morning and finish by 8:15am. Endo Scheduler is aware.

## 2016-09-23 ENCOUNTER — Ambulatory Visit (HOSPITAL_COMMUNITY)
Admission: RE | Admit: 2016-09-23 | Discharge: 2016-09-23 | Disposition: A | Discharge status: Home / Self Care | Payer: Medicare HMO | Source: Ambulatory Visit | Attending: Hematology | Admitting: Hematology

## 2016-09-23 DIAGNOSIS — D472 Monoclonal gammopathy: Secondary | ICD-10-CM | POA: Insufficient documentation

## 2016-09-26 ENCOUNTER — Encounter (HOSPITAL_COMMUNITY)
Admission: RE | Disposition: A | Discharge status: Home / Self Care | Payer: Self-pay | Source: Ambulatory Visit | Attending: Internal Medicine

## 2016-09-26 ENCOUNTER — Ambulatory Visit (HOSPITAL_COMMUNITY)
Admission: RE | Admit: 2016-09-26 | Discharge: 2016-09-26 | Disposition: A | Discharge status: Home / Self Care | Payer: Medicare HMO | Source: Ambulatory Visit | Attending: Internal Medicine | Admitting: Internal Medicine

## 2016-09-26 ENCOUNTER — Encounter (HOSPITAL_COMMUNITY): Payer: Self-pay | Admitting: *Deleted

## 2016-09-26 DIAGNOSIS — Z1211 Encounter for screening for malignant neoplasm of colon: Secondary | ICD-10-CM | POA: Diagnosis present

## 2016-09-26 DIAGNOSIS — D124 Benign neoplasm of descending colon: Secondary | ICD-10-CM | POA: Diagnosis not present

## 2016-09-26 DIAGNOSIS — J45909 Unspecified asthma, uncomplicated: Secondary | ICD-10-CM | POA: Diagnosis not present

## 2016-09-26 DIAGNOSIS — M1990 Unspecified osteoarthritis, unspecified site: Secondary | ICD-10-CM | POA: Diagnosis not present

## 2016-09-26 DIAGNOSIS — I10 Essential (primary) hypertension: Secondary | ICD-10-CM | POA: Diagnosis not present

## 2016-09-26 DIAGNOSIS — E7800 Pure hypercholesterolemia, unspecified: Secondary | ICD-10-CM | POA: Diagnosis not present

## 2016-09-26 DIAGNOSIS — K219 Gastro-esophageal reflux disease without esophagitis: Secondary | ICD-10-CM | POA: Diagnosis not present

## 2016-09-26 DIAGNOSIS — E119 Type 2 diabetes mellitus without complications: Secondary | ICD-10-CM | POA: Insufficient documentation

## 2016-09-26 DIAGNOSIS — Z7982 Long term (current) use of aspirin: Secondary | ICD-10-CM | POA: Diagnosis not present

## 2016-09-26 DIAGNOSIS — Z79899 Other long term (current) drug therapy: Secondary | ICD-10-CM | POA: Insufficient documentation

## 2016-09-26 DIAGNOSIS — K5730 Diverticulosis of large intestine without perforation or abscess without bleeding: Secondary | ICD-10-CM | POA: Diagnosis not present

## 2016-09-26 DIAGNOSIS — Z86010 Personal history of colonic polyps: Secondary | ICD-10-CM | POA: Insufficient documentation

## 2016-09-26 DIAGNOSIS — D649 Anemia, unspecified: Secondary | ICD-10-CM | POA: Insufficient documentation

## 2016-09-26 HISTORY — PX: POLYPECTOMY: SHX5525

## 2016-09-26 HISTORY — PX: COLONOSCOPY: SHX5424

## 2016-09-26 SURGERY — COLONOSCOPY
Anesthesia: Moderate Sedation

## 2016-09-26 MED ORDER — SODIUM CHLORIDE 0.9 % IV SOLN
INTRAVENOUS | Status: DC
  Administered 2016-09-26: 1000 mL via INTRAVENOUS

## 2016-09-26 MED ORDER — ONDANSETRON HCL 4 MG/2ML IJ SOLN
INTRAMUSCULAR | Status: AC
  Filled 2016-09-26: qty 2

## 2016-09-26 MED ORDER — MEPERIDINE HCL 100 MG/ML IJ SOLN
INTRAMUSCULAR | Status: AC
  Filled 2016-09-26: qty 2

## 2016-09-26 MED ORDER — MIDAZOLAM HCL 5 MG/5ML IJ SOLN
INTRAMUSCULAR | Status: AC
  Filled 2016-09-26: qty 10

## 2016-09-26 MED ORDER — MIDAZOLAM HCL 5 MG/5ML IJ SOLN
INTRAMUSCULAR | Status: DC | PRN
  Administered 2016-09-26: 2 mg via INTRAVENOUS
  Administered 2016-09-26: 1 mg via INTRAVENOUS

## 2016-09-26 MED ORDER — MEPERIDINE HCL 100 MG/ML IJ SOLN
INTRAMUSCULAR | Status: DC | PRN
Start: 2016-09-26 — End: 2016-09-26
  Administered 2016-09-26: 50 mg via INTRAVENOUS

## 2016-09-26 MED ORDER — ONDANSETRON HCL 4 MG/2ML IJ SOLN
INTRAMUSCULAR | Status: DC | PRN
  Administered 2016-09-26: 4 mg via INTRAVENOUS

## 2016-09-26 NOTE — H&P (Signed)
@LOGO @   Primary Care Physician:  [facility]:  Dr. Gala Romney  Pre-Procedure History & Physical: HPI:  Stacey Riley is a 76 y.o. female here for surveillance colonoscopy. History of colonic adenoma and iron deficiency anemia. No GI symptoms currently.  Past Medical History:  Diagnosis Date  . Anemia 03/14/2016  . Arthritis   . Asthma   . Bradycardia   . DM (diabetes mellitus) ([place])   . GERD (gastroesophageal reflux disease)   . High cholesterol   . HTN (hypertension)     Past Surgical History:  Procedure Laterality Date  . CHOLECYSTECTOMY  1990s  . COLONOSCOPY  03/18/1999   Dr Kaysey Berndt-int  hemorrhoids, pancolonic diverticula  . COLONOSCOPY WITH ESOPHAGOGASTRODUODENOSCOPY (EGD)  08/06/2011   Jaydalee Bardwell: Schatzki ring, moderate sized hiatal hernia status post dilation for history of dysphagia. Pancolonic diverticula, single diminutive polyp at the base of the cecum removed (tubular adenoma). Next colonoscopy recommended for April 2018  . ESOPHAGOGASTRODUODENOSCOPY  07/02/01   Dr Ad Guttman-Schatzki's ring s/p 4F maloney dilation, otherwise normal/small hiatal hernia/   Linear erosion and ulceration in the proximal stomach/  The ulcerated lesion in the proximal stomach  benign.This may be a [name] lesion secondary to trauma to the mucosa, straddling the  diaphragmatic hiatus in the presence of a hiatal hernia   . ESOPHAGOGASTRODUODENOSCOPY N/A 02/14/2016   Procedure: ESOPHAGOGASTRODUODENOSCOPY (EGD);  Surgeon: Daneil Dolin, MD;  [facility];  Service: Endoscopy;  Laterality: N/A;  Venia Minks DILATION  08/06/2011   Procedure: Venia Minks DILATION;  Surgeon: Daneil Dolin, MD;  [facility];  Service: Endoscopy;  Laterality: N/A;  . TUBAL LIGATION      Prior to Admission medications   Medication Sig Start Date End Date Taking? Authorizing Provider  amLODipine (NORVASC) 10 MG tablet Take 10 mg by mouth daily.  04/22/11  Yes  [provider]  aspirin 81 MG tablet Take 81 mg by mouth once a week.    Yes [provider]  hydrochlorothiazide (HYDRODIURIL) 25 MG tablet Take 25 mg by mouth daily.  04/22/11  Yes [provider]  lisinopril (PRINIVIL,ZESTRIL) 40 MG tablet Take 40 mg by mouth daily.  04/22/11  Yes [provider]  loratadine (CLARITIN) 10 MG tablet Take 1 tablet (10 mg total) by mouth daily. Patient taking differently: Take 10 mg by mouth daily at 2 PM.  02/16/16  Yes Thurnell Lose, MD  pantoprazole (PROTONIX) 40 MG tablet Take 1 tablet (40 mg total) by mouth 2 (two) times daily before a meal. Patient taking differently: Take 40 mg by mouth daily before breakfast.  03/14/16  Yes Carlis Stable, NP  polyethylene glycol-electrolytes (TRILYTE) 420 g solution Take 4,000 mLs by mouth as directed. 08/19/16  Yes Gaige Sebo, Cristopher Estimable, MD  pravastatin (PRAVACHOL) 20 MG tablet Take 20 mg by mouth at bedtime.   Yes [provider]  sucralfate (CARAFATE) 1 g tablet TAKE (1) TABLET BY MOUTH FOUR TIMES DAILY WITH MEALS AND AT BEDTIME. 07/21/16  Yes Annitta Needs, NP  albuterol (PROAIR HFA) 108 (90 BASE) MCG/ACT inhaler Inhale 2 puffs into the lungs every 6 (six) hours as needed for wheezing or shortness of breath.    [provider]    Allergies as of 08/19/2016 - Review Complete 08/19/2016  Allergen Reaction Noted  . Augmentin [amoxicillin-pot clavulanate] Nausea And Vomiting 02/14/2016  . Sulfa antibiotics Swelling 07/14/2011    Family History  Problem Relation Age  of Onset  . Aneurysm Daughter   . Stroke Daughter   . Diabetes Daughter   . Hypertension Mother   . Dementia Mother   . Hypertension Father   . Kidney cancer Brother   . Cancer Brother        brain  . Cancer Brother        bone  . Stroke Brother   . Hypertension Daughter   . Hypertension Son   . Hyperlipidemia Son   . Colon cancer Neg Hx   . Inflammatory bowel disease Neg Hx     Social History    Social History  . Marital status: Divorced    Spouse name: N/A  . Number of children: 5  . Years of education: N/A   Occupational History  . retired; Charity fundraiser    Social History Main Topics  . Smoking status: Never Smoker  . Smokeless tobacco: Never Used  . Alcohol use No  . Drug use: No  . Sexual activity: Not on file   Other Topics Concern  . Not on file   Social History Narrative   Lives alone    Review of Systems: See HPI, otherwise negative ROS  Physical Exam: BP (!) 168/60   Pulse (!) 48   Temp 98.3 F (36.8 C) (Oral)   Resp 18   Ht 5\' 4"  (1.626 m)   Wt 176 lb (79.8 kg)   SpO2 100%   BMI 30.21 kg/m  General:   Alert,  Well-developed, well-nourished, pleasant and cooperative in NAD Neck:  Supple; no masses or thyromegaly. No significant cervical adenopathy. Lungs:  Clear throughout to auscultation.   No wheezes, crackles, or rhonchi. No acute distress. Heart:  Regular rate and rhythm; no murmurs, clicks, rubs,  or gallops. Abdomen: Non-distended, normal bowel sounds.  Soft and nontender without appreciable mass or hepatosplenomegaly.  Pulses:  Normal pulses noted. Extremities:  Without clubbing or edema.  Impression:  76 year old lady with history of colonic adenoma. History of anemia. Here for surveillance colonoscopy per plan.  Recommendations:  I have offered the patient a surveillance colonoscopy today per plan. The risks, benefits, limitations, alternatives and imponderables have been reviewed with the patient. Questions have been answered. All parties are agreeable.    Notice: This dictation was prepared with Dragon dictation along with smaller phrase technology. Any transcriptional errors that result from this process are unintentional and may not be corrected upon review.

## 2016-09-26 NOTE — Discharge Instructions (Addendum)
°Colonoscopy °Discharge Instructions ° °Read the instructions outlined below and refer to this sheet in the next few weeks. These discharge instructions provide you with general information on caring for yourself after you leave the hospital. Your doctor may also give you specific instructions. While your treatment has been planned according to the most current medical practices available, unavoidable complications occasionally occur. If you have any problems or questions after discharge, call Dr. Rourk at [phone]. °ACTIVITY °· You may resume your regular activity, but move at a slower pace for the next 24 hours.  °· Take frequent rest periods for the next 24 hours.  °· Walking will help get rid of the air and reduce the bloated feeling in your belly (abdomen).  °· No driving for 24 hours (because of the medicine (anesthesia) used during the test).   °· Do not sign any important legal documents or operate any machinery for 24 hours (because of the anesthesia used during the test).  °NUTRITION °· Drink plenty of fluids.  °· You may resume your normal diet as instructed by your doctor.  °· Begin with a light meal and progress to your normal diet. Heavy or fried foods are harder to digest and may make you feel sick to your stomach (nauseated).  °· Avoid alcoholic beverages for 24 hours or as instructed.  °MEDICATIONS °· You may resume your normal medications unless your doctor tells you otherwise.  °WHAT YOU CAN EXPECT TODAY °· Some feelings of bloating in the abdomen.  °· Passage of more gas than usual.  °· Spotting of blood in your stool or on the toilet paper.  °IF YOU HAD POLYPS REMOVED DURING THE COLONOSCOPY: °· No aspirin products for 7 days or as instructed.  °· No alcohol for 7 days or as instructed.  °· Eat a soft diet for the next 24 hours.  °FINDING OUT THE RESULTS OF YOUR TEST °Not all test results are available during your visit. If your test results are not back during the visit, make an appointment  with your caregiver to find out the results. Do not assume everything is normal if you have not heard from your caregiver or the medical facility. It is important for you to follow up on all of your test results.  °SEEK IMMEDIATE MEDICAL ATTENTION IF: °· You have more than a spotting of blood in your stool.  °· Your belly is swollen (abdominal distention).  °· You are nauseated or vomiting.  °· You have a temperature over 101.  °· You have abdominal pain or discomfort that is severe or gets worse throughout the day.  ° ° °Diverticulosis and colon polyp information provided ° °Further recommendations to follow pending review of pathology report ° ° °Colon Polyps °Polyps are tissue growths inside the body. Polyps can grow in many places, including the large intestine (colon). A polyp may be a round bump or a mushroom-shaped growth. You could have one polyp or several. °Most colon polyps are noncancerous (benign). However, some colon polyps can become cancerous over time. °What are the causes? °The exact cause of colon polyps is not known. °What increases the risk? °This condition is more likely to develop in people who: °· Have a family history of colon cancer or colon polyps. °· Are older than 50 or older than 45 if they are African American. °· Have inflammatory bowel disease, such as ulcerative colitis or Crohn disease. °· Are overweight. °· Smoke cigarettes. °· Do not get enough exercise. °· Drink too   much alcohol. °· Eat a diet that is: °? High in fat and red meat. °? Low in fiber. °· Had childhood cancer that was treated with abdominal radiation. ° °What are the signs or symptoms? °Most polyps do not cause symptoms. If you have symptoms, they may include: °· Blood coming from your rectum when having a bowel movement. °· Blood in your stool. The stool may look dark red or black. °· A change in bowel habits, such as constipation or diarrhea. ° °How is this diagnosed? °This condition is diagnosed with a  colonoscopy. This is a procedure that uses a lighted, flexible scope to look at the inside of your colon. °How is this treated? °Treatment for this condition involves removing any polyps that are found. Those polyps will then be tested for cancer. If cancer is found, your health care provider will talk to you about options for colon cancer treatment. °Follow these instructions at home: °Diet °· Eat plenty of fiber, such as fruits, vegetables, and whole grains. °· Eat foods that are high in calcium and vitamin D, such as milk, cheese, yogurt, eggs, liver, fish, and broccoli. °· Limit foods high in fat, red meats, and processed meats, such as hot dogs, sausage, bacon, and lunch meats. °· Maintain a healthy weight, or lose weight if recommended by your health care provider. °General instructions °· Do not smoke cigarettes. °· Do not drink alcohol excessively. °· Keep all follow-up visits as told by your health care provider. This is important. This includes keeping regularly scheduled colonoscopies. Talk to your health care provider about when you need a colonoscopy. °· Exercise every day or as told by your health care provider. °Contact a health care provider if: °· You have new or worsening bleeding during a bowel movement. °· You have new or increased blood in your stool. °· You have a change in bowel habits. °· You unexpectedly lose weight. °This information is not intended to replace advice given to you by your health care provider. Make sure you discuss any questions you have with your health care provider. °Document Released: 12/26/2003 Document Revised: 09/06/2015 Document Reviewed: 02/19/2015 °Elsevier Interactive Patient Education © 2018 [employer] ° °Diverticulosis °Diverticulosis is a condition that develops when small pouches (diverticula) form in the wall of the large intestine (colon). The colon is where water is absorbed and stool is formed. The pouches form when the inside layer of the colon pushes  through weak spots in the outer layers of the colon. You may have a few pouches or many of them. °What are the causes? °The cause of this condition is not known. °What increases the risk? °The following factors may make you more likely to develop this condition: °· Being older than age 60. Your risk for this condition increases with age. Diverticulosis is rare among people younger than age 30. By age 80, many people have it. °· Eating a low-fiber diet. °· Having frequent constipation. °· Being overweight. °· Not getting enough exercise. °· Smoking. °· Taking over-the-counter pain medicines, like aspirin and ibuprofen. °· Having a family history of diverticulosis. ° °What are the signs or symptoms? °In most people, there are no symptoms of this condition. If you do have symptoms, they may include: °· Bloating. °· Cramps in the abdomen. °· Constipation or diarrhea. °· Pain in the lower left side of the abdomen. ° °How is this diagnosed? °This condition is most often diagnosed during an exam for other colon problems. Because diverticulosis usually has no symptoms,   it often cannot be diagnosed independently. This condition may be diagnosed by: °· Using a flexible scope to examine the colon (colonoscopy). °· Taking an X-ray of the colon after dye has been put into the colon (barium enema). °· Doing a CT scan. ° °How is this treated? °You may not need treatment for this condition if you have never developed an infection related to diverticulosis. If you have had an infection before, treatment may include: °· Eating a high-fiber diet. This may include eating more fruits, vegetables, and grains. °· Taking a fiber supplement. °· Taking a live bacteria supplement (probiotic). °· Taking medicine to relax your colon. °· Taking antibiotic medicines. ° °Follow these instructions at home: °· Drink 6-8 glasses of water or more each day to prevent constipation. °· Try not to strain when you have a bowel movement. °· If you have had  an infection before: °? Eat more fiber as directed by your health care provider or your diet and nutrition specialist (dietitian). °? Take a fiber supplement or probiotic, if your health care provider approves. °· Take over-the-counter and prescription medicines only as told by your health care provider. °· If you were prescribed an antibiotic, take it as told by your health care provider. Do not stop taking the antibiotic even if you start to feel better. °· Keep all follow-up visits as told by your health care provider. This is important. °Contact a health care provider if: °· You have pain in your abdomen. °· You have bloating. °· You have cramps. °· You have not had a bowel movement in 3 days. °Get help right away if: °· Your pain gets worse. °· Your bloating becomes very bad. °· You have a fever or chills, and your symptoms suddenly get worse. °· You vomit. °· You have bowel movements that are bloody or black. °· You have bleeding from your rectum. °Summary °· Diverticulosis is a condition that develops when small pouches (diverticula) form in the wall of the large intestine (colon). °· You may have a few pouches or many of them. °· This condition is most often diagnosed during an exam for other colon problems. °· If you have had an infection related to diverticulosis, treatment may include increasing the fiber in your diet, taking supplements, or taking medicines. °This information is not intended to replace advice given to you by your health care provider. Make sure you discuss any questions you have with your health care provider. °Document Released: 12/27/2003 Document Revised: 02/18/2016 Document Reviewed: 02/18/2016 °Elsevier Interactive Patient Education © 2017 [employer] ° °

## 2016-09-26 NOTE — Op Note (Signed)
[facility] Patient Name: Stacey Riley Procedure Date: 09/26/2016 10:49 AM MRN: [id number] Date of Birth: 06/05/1940 Attending MD: Norvel Richards , MD CSN: [id number] Age: 76 Admit Type: Outpatient Procedure:                Colonoscopy Indications:              High risk colon cancer surveillance: Personal                            history of colonic polyps Providers:                Norvel Richards, MD, Lurline Del, RN, Pur[place] Nails.                            [place], [employer] Referring MD:              Medicines:                Midazolam 3 mg IV, Meperidine 50 mg IV Complications:            No immediate complications. Estimated Blood Loss:     Estimated blood loss was minimal. Procedure:                Pre-Anesthesia Assessment:                           - Prior to the procedure, a History and Physical                            was performed, and patient medications and                            allergies were reviewed. The patient's tolerance of                            previous anesthesia was also reviewed. The risks                            and benefits of the procedure and the sedation                            options and risks were discussed with the patient.                            All questions were answered, and informed consent                            was obtained. Prior Anticoagulants: The patient has                            taken no previous anticoagulant or antiplatelet                            agents. ASA Grade Assessment: II - A patient with  mild systemic disease. After reviewing the risks                            and benefits, the patient was deemed in                            satisfactory condition to undergo the procedure.                           After obtaining informed consent, the colonoscope                            was passed under direct vision. Throughout the   procedure, the patient's blood pressure, pulse, and                            oxygen saturations were monitored continuously. The                            EC-[id number] ([id number]) scope was introduced through                            the anus and advanced to the the cecum, identified                            by appendiceal orifice and ileocecal valve. The                            ileocecal valve, appendiceal orifice, and rectum                            were photographed. The entire colon was well                            visualized. The colonoscopy was performed without                            difficulty. The quality of the bowel preparation                            was adequate. Scope In: 11:00:22 AM Scope Out: 11:11:39 AM Scope Withdrawal Time: 0 hours 6 minutes 18 seconds  Total Procedure Duration: 0 hours 11 minutes 17 seconds  Findings:      The perianal and digital rectal examinations were normal.      Multiple small and large-mouthed diverticula were found in the entire       colon.      A 4 mm polyp was found in the descending colon. The polyp was sessile.       The polyp was removed with a cold snare. Resection and retrieval were       complete. Estimated blood loss was minimal.      The exam was otherwise without abnormality on direct and retroflexion       views. Impression:               - Diverticulosis  in the entire examined colon.                           - One 4 mm polyp in the descending colon, removed                            with a cold snare. Resected and retrieved.                           - The examination was otherwise normal on direct                            and retroflexion views. Moderate Sedation:      Moderate (conscious) sedation was administered by the endoscopy nurse       and supervised by the endoscopist. The following parameters were       monitored: oxygen saturation, heart rate, blood pressure, respiratory       rate, EKG,  adequacy of pulmonary ventilation, and response to care.       Total physician intraservice time was 16 minutes. Recommendation:           - Patient has a contact number available for                            emergencies. The signs and symptoms of potential                            delayed complications were discussed with the                            patient. Return to normal activities tomorrow.                            Written discharge instructions were provided to the                            patient.                           - Resume previous diet.                           - Continue present medications.                           - No repeat colonoscopy due to age.                           - Return to GI clinic (date not yet determined). Procedure Code(s):        --- Professional ---                           [phone], Colonoscopy, flexible; with removal of                            tumor(s), polyp(s), or other lesion(s) by  snare                            technique                           99152, Moderate sedation services provided by the                            same physician or other qualified health care                            professional performing the diagnostic or                            therapeutic service that the sedation supports,                            requiring the presence of an independent trained                            observer to assist in the monitoring of the                            patient's level of consciousness and physiological                            status; initial 15 minutes of intraservice time,                            patient age 70 years or older Diagnosis Code(s):        --- Professional ---                           Z86.010, Personal history of colonic polyps                           D12.4, Benign neoplasm of descending colon                           K57.30, Diverticulosis of large intestine without                             perforation or abscess without bleeding CPT copyright 2016 American Medical Association. All rights reserved. The codes documented in this report are preliminary and upon coder review may  be revised to meet current compliance requirements. Cristopher Estimable. [place] Stell, MD Norvel Richards, MD 09/26/2016 11:22:40 AM This report has been signed electronically. Number of Addenda: 0

## 2016-09-29 ENCOUNTER — Encounter: Payer: Self-pay | Admitting: Internal Medicine

## 2016-10-01 ENCOUNTER — Encounter (HOSPITAL_COMMUNITY): Payer: Self-pay | Admitting: Internal Medicine

## 2016-10-06 ENCOUNTER — Encounter (HOSPITAL_COMMUNITY): Payer: Medicare HMO

## 2016-10-06 ENCOUNTER — Encounter (HOSPITAL_COMMUNITY): Payer: Medicare HMO | Attending: Oncology | Admitting: Oncology

## 2016-10-06 ENCOUNTER — Encounter (HOSPITAL_COMMUNITY): Payer: Self-pay

## 2016-10-06 VITALS — BP 156/55 | HR 62 | Temp 98.6°F | Resp 18 | Ht 64.0 in | Wt 177.2 lb

## 2016-10-06 DIAGNOSIS — D508 Other iron deficiency anemias: Secondary | ICD-10-CM | POA: Diagnosis not present

## 2016-10-06 DIAGNOSIS — D500 Iron deficiency anemia secondary to blood loss (chronic): Secondary | ICD-10-CM | POA: Diagnosis not present

## 2016-10-06 DIAGNOSIS — D472 Monoclonal gammopathy: Secondary | ICD-10-CM

## 2016-10-06 LAB — COMPREHENSIVE METABOLIC PANEL
ALK PHOS: 61 U/L (ref 38–126)
ALT: 24 U/L (ref 14–54)
AST: 24 U/L (ref 15–41)
Albumin: 4.3 g/dL (ref 3.5–5.0)
Anion gap: 10 (ref 5–15)
BUN: 27 mg/dL — AB (ref 6–20)
CALCIUM: 9.2 mg/dL (ref 8.9–10.3)
CO2: 27 mmol/L (ref 22–32)
CREATININE: 1.5 mg/dL — AB (ref 0.44–1.00)
Chloride: 99 mmol/L — ABNORMAL LOW (ref 101–111)
GFR, EST AFRICAN AMERICAN: 38 mL/min — AB (ref >60)
GFR, EST NON AFRICAN AMERICAN: 33 mL/min — AB (ref >60)
Glucose, Bld: 96 mg/dL (ref 65–99)
Potassium: 3.4 mmol/L — ABNORMAL LOW (ref 3.5–5.1)
Sodium: 136 mmol/L (ref 135–145)
Total Bilirubin: 0.5 mg/dL (ref 0.3–1.2)
Total Protein: 8.1 g/dL (ref 6.5–8.1)

## 2016-10-06 LAB — CBC WITH DIFFERENTIAL/PLATELET
Basophils Absolute: 0 10*3/uL (ref 0.0–0.1)
Basophils Relative: 0 %
Eosinophils Absolute: 0.1 10*3/uL (ref 0.0–0.7)
Eosinophils Relative: 2 %
HCT: 42.2 % (ref 36.0–46.0)
HEMOGLOBIN: 13.8 g/dL (ref 12.0–15.0)
LYMPHS ABS: 3.1 10*3/uL (ref 0.7–4.0)
LYMPHS PCT: 44 %
MCH: 28.6 pg (ref 26.0–34.0)
MCHC: 32.7 g/dL (ref 30.0–36.0)
MCV: 87.4 fL (ref 78.0–100.0)
Monocytes Absolute: 0.6 10*3/uL (ref 0.1–1.0)
Monocytes Relative: 9 %
NEUTROS PCT: 45 %
Neutro Abs: 3.2 10*3/uL (ref 1.7–7.7)
Platelets: 194 10*3/uL (ref 150–400)
RBC: 4.83 MIL/uL (ref 3.87–5.11)
RDW: 14 % (ref 11.5–15.5)
WBC: 7 10*3/uL (ref 4.0–10.5)

## 2016-10-06 LAB — LACTATE DEHYDROGENASE: LDH: 143 U/L (ref 98–192)

## 2016-10-06 LAB — IRON AND TIBC
Iron: 74 ug/dL (ref 28–170)
Saturation Ratios: 21 % (ref 10.4–31.8)
TIBC: 356 ug/dL (ref 250–450)
UIBC: 282 ug/dL

## 2016-10-06 LAB — FERRITIN: FERRITIN: 43 ng/mL (ref 11–307)

## 2016-10-06 NOTE — Progress Notes (Signed)
[facility]  HEMATOLOGY ONCOLOGY PROGRESS NOTE  Date of service: .10/06/2016  Patient Care Team: [facility] as PCP - General Rourk, Cristopher Estimable, MD (Gastroenterology)  CC: f/u for Anemia  Diagnosis: Iron deficiency Anemia and MGUS  Current Treatment: IV iron as needed   INTERVAL HISTORY:  Patient is here for follow-up of her iron deficiency anemia. Patient recently had a colonoscopy on 09/26/16 which showed diverticulosis throughout the colon as well as a 4 mm polyp which was resected and was found to be benign. She states that she overall she has been doing well. She denies any fatigue. She denies any bleeding including hematochezia, melena, hematuria. Overall she feels well and has no complaints.  In regards to her MGUS, patient had a skeletal survey performed on 09/23/16 which was negative for lytic lesions.  REVIEW OF SYSTEMS:    10 Point review of systems of done and is negative except as noted above.  . Past Medical History:  Diagnosis Date  . Anemia 03/14/2016  . Arthritis   . Asthma   . Bradycardia   . DM (diabetes mellitus) ([place])   . GERD (gastroesophageal reflux disease)   . High cholesterol   . HTN (hypertension)     . Past Surgical History:  Procedure Laterality Date  . CHOLECYSTECTOMY  1990s  . COLONOSCOPY  03/18/1999   Dr Rourk-int  hemorrhoids, pancolonic diverticula  . COLONOSCOPY N/A 09/26/2016   Procedure: COLONOSCOPY;  Surgeon: Daneil Dolin, MD;  Location: [facility];  Service: Endoscopy;  Laterality: N/A;  1200  . COLONOSCOPY WITH ESOPHAGOGASTRODUODENOSCOPY (EGD)  08/06/2011   Rourk: Schatzki ring, moderate sized hiatal hernia status post dilation for history of dysphagia. Pancolonic diverticula, single diminutive polyp at the base of the cecum removed (tubular adenoma). Next colonoscopy recommended for April 2018  . ESOPHAGOGASTRODUODENOSCOPY  07/02/01   Dr Rourk-Schatzki's ring s/p 72F maloney dilation, otherwise normal/small  hiatal hernia/   Linear erosion and ulceration in the proximal stomach/  The ulcerated lesion in the proximal stomach  benign.This may be a [name] lesion secondary to trauma to the mucosa, straddling the  diaphragmatic hiatus in the presence of a hiatal hernia   . ESOPHAGOGASTRODUODENOSCOPY N/A 02/14/2016   Procedure: ESOPHAGOGASTRODUODENOSCOPY (EGD);  Surgeon: Daneil Dolin, MD;  Location: [facility];  Service: Endoscopy;  Laterality: N/A;  Venia Minks DILATION  08/06/2011   Procedure: Venia Minks DILATION;  Surgeon: Daneil Dolin, MD;  Location: [facility];  Service: Endoscopy;  Laterality: N/A;  . POLYPECTOMY  09/26/2016   Procedure: POLYPECTOMY;  Surgeon: Daneil Dolin, MD;  Location: [facility];  Service: Endoscopy;;  descending colon  . TUBAL LIGATION      . Social History  Substance Use Topics  . Smoking status: Never Smoker  . Smokeless tobacco: Never Used  . Alcohol use No    ALLERGIES:  is allergic to augmentin [amoxicillin-pot clavulanate] and sulfa antibiotics.  MEDICATIONS:  Current Outpatient Prescriptions  Medication Sig Dispense Refill  . albuterol (PROAIR HFA) 108 (90 BASE) MCG/ACT inhaler Inhale 2 puffs into the lungs every 6 (six) hours as needed for wheezing or shortness of breath.    Marland Kitchen amLODipine (NORVASC) 10 MG tablet Take 10 mg by mouth daily.     Marland Kitchen aspirin 81 MG tablet Take 81 mg by mouth once a week.     . hydrochlorothiazide (HYDRODIURIL) 25 MG tablet Take 25 mg by mouth daily.     Marland Kitchen lisinopril (PRINIVIL,ZESTRIL) 40 MG  tablet Take 40 mg by mouth daily.     Marland Kitchen loratadine (CLARITIN) 10 MG tablet Take 1 tablet (10 mg total) by mouth daily. (Patient taking differently: Take 10 mg by mouth daily at 2 PM. ) 30 tablet 0  . pantoprazole (PROTONIX) 40 MG tablet Take 1 tablet (40 mg total) by mouth 2 (two) times daily before a meal. (Patient taking differently: Take 40 mg by mouth daily before breakfast. ) 180 tablet 1  . polyethylene glycol-electrolytes (TRILYTE)  420 g solution Take 4,000 mLs by mouth as directed. 4000 mL 0  . pravastatin (PRAVACHOL) 20 MG tablet Take 20 mg by mouth at bedtime.    . sucralfate (CARAFATE) 1 g tablet TAKE (1) TABLET BY MOUTH FOUR TIMES DAILY WITH MEALS AND AT BEDTIME. 120 tablet 0   No current facility-administered medications for this visit.     PHYSICAL EXAMINATION: ECOG PERFORMANCE STATUS: 2 - Symptomatic, <50% confined to bed  . Vitals:   10/06/16 1459  BP: (!) 156/55  Pulse: 62  Resp: 18  Temp: 98.6 F (37 C)    Filed Weights   10/06/16 1459  Weight: 177 lb 3.2 oz (80.4 kg)   .Body mass index is 30.42 kg/m.  GENERAL:alert, in no acute distress and comfortable SKIN: no acute rashes, no significant lesions EYES: conjunctiva are pink and non-injected, sclera anicteric OROPHARYNX: MMM, no exudates, no oropharyngeal erythema or ulceration NECK: supple, no JVD LYMPH:  no palpable lymphadenopathy in the cervical, axillary or inguinal regions LUNGS: clear to auscultation b/l with normal respiratory effort HEART: regular rate & rhythm ABDOMEN:  normoactive bowel sounds , non tender, not distended. Extremity: no pedal edema PSYCH: alert & oriented x 3 with fluent speech NEURO: no focal motor/sensory deficits  LABORATORY DATA:   I have reviewed the data as listed  . CBC Latest Ref Rng & Units 10/06/2016 06/30/2016 05/29/2016  WBC 4.0 - 10.5 K/uL 7.0 6.9 8.4  Hemoglobin 12.0 - 15.0 g/dL 13.8 12.6 12.7  Hematocrit 36.0 - 46.0 % 42.2 40.0 41.2  Platelets 150 - 400 K/uL 194 202 219    . CMP Latest Ref Rng & Units 10/06/2016 06/30/2016 05/29/2016  Glucose 65 - 99 mg/dL 96 104(H) 94  BUN 6 - 20 mg/dL 27(H) 19 18  Creatinine 0.44 - 1.00 mg/dL 1.50(H) 1.09(H) 0.94  Sodium 135 - 145 mmol/L 136 133(L) 138  Potassium 3.5 - 5.1 mmol/L 3.4(L) 3.6 3.5  Chloride 101 - 111 mmol/L 99(L) 97(L) 99(L)  CO2 22 - 32 mmol/L _0 Calcium 8.9 - 10.3 mg/dL 9.2 9.0 9.6  Total Protein 6.5 - 8.1 g/dL 8.1 - -  Total  Bilirubin 0.3 - 1.2 mg/dL 0.5 - -  Alkaline Phos 38 - 126 U/L 61 - -  AST 15 - 41 U/L 24 - -  ALT 14 - 54 U/L 24 - -   . Lab Results  Component Value Date   IRON 71 06/30/2016   TIBC 318 06/30/2016   IRONPCTSAT 22 06/30/2016   (Iron and TIBC)  Lab Results  Component Value Date   FERRITIN 148 06/30/2016   Component     Latest Ref Rng & Units 05/29/2016 05/29/2016 05/29/2016        12:40 PM 12:40 PM 12:40 PM  Total Protein ELP     6.0 - 8.5 g/dL  8.2 8.2  Albumin ELP     2.9 - 4.4 g/dL   3.9  Alpha-1-Globulin     0.0 - 0.4  g/dL   0.3  Alpha-2-Globulin     0.4 - 1.0 g/dL   1.0  Beta Globulin     0.7 - 1.3 g/dL   1.1  Gamma Globulin     0.4 - 1.8 g/dL   1.9 (H)  M-SPIKE, %     Not Observed g/dL   1.1 (H)  SPE Interp.        Comment  Comment        Comment  Globulin, Total     2.2 - 3.9 g/dL   4.3 (H)  A/G Ratio     0.7 - 1.7   0.9  IgG (Immunoglobin G), Serum     700 - 1,600 mg/dL 1,877 (H) 1,885 (H)   IgA     64 - 422 mg/dL 149 156   IgM, Serum     26 - 217 mg/dL 86 83   Immunofixation Result, Serum       Comment   Kappa free light chain     3.3 - 19.4 mg/L 20.5 (H)    Lamda free light chains     5.7 - 26.3 mg/L 20.5    Kappa, lamda light chain ratio     0.26 - 1.65 1.00      Immunofixation electrophoresis      The value has a corrected status.      No reference range information available      Resulting Lab: [facility]      Comments:           (NOTE)           Immunofixation shows IgG monoclonal protein with lambda            light chain           specificity.  RADIOGRAPHIC STUDIES: I have personally reviewed the radiological images as listed and agreed with the findings in the report. Dg Bone Survey Met  Result Date: 09/23/2016 CLINICAL DATA:  MGUS EXAM: METASTATIC BONE SURVEY COMPARISON:  Chest radiograph, 04/23/2013 FINDINGS: There are no osteolytic lesions. There is no evidence of multiple myeloma. There are no osteoblastic lesions. There is  concentric hip joint space narrowing bilaterally, relatively symmetric, with marginal osteophytes from the superior acetabula and bases of both femoral heads. Bones are demineralized. There are mild disc degenerative changes along the thoracic spine. IMPRESSION: 1. No evidence of neoplastic disease to bone. Specifically, no lytic lesions are seen to suggest multiple myeloma. Electronically Signed   By: Lajean Manes M.D.   On: 09/23/2016 14:08    ASSESSMENT & PLAN:   76 year old female with   #1 Iron deficiency anemia  Recent colonoscopy on 09/26/16 was negative for evidence of bleeding or malignancy. GI workup S/P EGD by Dr. 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  Colonoscopy by Dr. Gala Romney in 2012 demonstrated a normal rectum, colonic diverticulosis, and a single cecal polyp that was removed and negative for malignancy/dysplasia.  Hemoglobin has improved up to 13.8 g/dL today. Iron studies are pending at this time.  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 Patient had her SPEP done on 05/29/2016 that showed an IgG level of 1900  with an M protein of 1.1 g/dL  Normal K/L free light chain ratio . No significant anemia at this time, no hypercalcemia or change in kidney function and no focal new bone pains . Continue observation of her labs at this time. Has a 1%  chance per year of progression to multiple myeloma. Her skeletal survey was negative for lytic lesions.   Return to clinic in 4 months for follow-up with repeat lab work as stated below.  Orders Placed This Encounter  Procedures  . CBC with Differential    Standing Status:   Future    Standing Expiration Date:   10/06/2017  . Comprehensive metabolic panel    Standing Status:   Future    Standing Expiration Date:   10/06/2017  . Multiple Myeloma Panel (SPEP&IFE w/QIG)    Standing Status:   Future    Standing Expiration Date:   10/06/2017  .  Kappa/lambda light chains    Standing Status:   Future    Standing Expiration Date:   10/06/2017  . Beta 2 microglobuline, serum    Standing Status:   Future    Standing Expiration Date:   10/06/2017  . Iron and TIBC    Standing Status:   Future    Standing Expiration Date:   10/06/2017  . Ferritin    Standing Status:   Future    Standing Expiration Date:   10/06/2017

## 2016-10-08 LAB — MULTIPLE MYELOMA PANEL, SERUM
ALBUMIN/GLOB SERPL: 1.1 (ref 0.7–1.7)
ALPHA 1: 0.2 g/dL (ref 0.0–0.4)
ALPHA2 GLOB SERPL ELPH-MCNC: 0.9 g/dL (ref 0.4–1.0)
Albumin SerPl Elph-Mcnc: 4 g/dL (ref 2.9–4.4)
B-GLOBULIN SERPL ELPH-MCNC: 1 g/dL (ref 0.7–1.3)
GAMMA GLOB SERPL ELPH-MCNC: 1.7 g/dL (ref 0.4–1.8)
GLOBULIN, TOTAL: 3.8 g/dL (ref 2.2–3.9)
IGG (IMMUNOGLOBIN G), SERUM: 1538 mg/dL (ref 700–1600)
IGM, SERUM: 63 mg/dL (ref 26–217)
IgA: 121 mg/dL (ref 64–422)
M PROTEIN SERPL ELPH-MCNC: 0.9 g/dL — AB
Total Protein ELP: 7.8 g/dL (ref 6.0–8.5)

## 2016-10-08 LAB — KAPPA/LAMBDA LIGHT CHAINS
KAPPA FREE LGHT CHN: 19.1 mg/L (ref 3.3–19.4)
Kappa, lambda light chain ratio: 0.98 (ref 0.26–1.65)
LAMDA FREE LIGHT CHAINS: 19.5 mg/L (ref 5.7–26.3)

## 2016-10-17 ENCOUNTER — Other Ambulatory Visit: Payer: Self-pay | Admitting: Gastroenterology

## 2016-10-17 DIAGNOSIS — K219 Gastro-esophageal reflux disease without esophagitis: Secondary | ICD-10-CM

## 2016-10-17 DIAGNOSIS — K922 Gastrointestinal hemorrhage, unspecified: Secondary | ICD-10-CM

## 2016-10-20 NOTE — Telephone Encounter (Signed)
Please let patient know that we should stop carafate as we usually don't keep patients on this chronically.   Bring her back for ov in 3 months.

## 2016-10-24 NOTE — Telephone Encounter (Signed)
Called pt- LMOM-please schedule ov.

## 2016-10-24 NOTE — Telephone Encounter (Signed)
OV made and appt card mailed °

## 2016-10-27 ENCOUNTER — Telehealth: Payer: Self-pay | Admitting: Internal Medicine

## 2016-10-27 ENCOUNTER — Other Ambulatory Visit: Payer: Self-pay | Admitting: Gastroenterology

## 2016-10-27 DIAGNOSIS — K922 Gastrointestinal hemorrhage, unspecified: Secondary | ICD-10-CM

## 2016-10-27 DIAGNOSIS — K219 Gastro-esophageal reflux disease without esophagitis: Secondary | ICD-10-CM

## 2016-10-27 NOTE — Telephone Encounter (Signed)
Stacey Riley Wrote:  Please let patient know that we should stop carafate as we usually don't keep patients on this chronically.   Bring her back for ov in 3 months

## 2016-10-27 NOTE — Telephone Encounter (Signed)
Pt said that [facility] in [place] told her that her prescription of sucralfate was denied and needed to call us. She can be reached at [phone] or [phone]

## 2016-10-27 NOTE — Telephone Encounter (Signed)
Patient made aware and was told her f/u appt is 10/12 at 10:00 am

## 2017-01-23 ENCOUNTER — Ambulatory Visit: Payer: Medicare HMO | Admitting: Gastroenterology

## 2017-02-05 ENCOUNTER — Encounter (HOSPITAL_COMMUNITY): Payer: Self-pay | Admitting: Oncology

## 2017-02-05 ENCOUNTER — Encounter (HOSPITAL_COMMUNITY): Payer: Medicare HMO | Attending: Oncology

## 2017-02-05 ENCOUNTER — Encounter (HOSPITAL_BASED_OUTPATIENT_CLINIC_OR_DEPARTMENT_OTHER): Payer: Medicare HMO | Admitting: Oncology

## 2017-02-05 VITALS — BP 174/51 | HR 52 | Temp 97.6°F | Resp 16 | Ht 64.0 in | Wt 179.0 lb

## 2017-02-05 DIAGNOSIS — K5730 Diverticulosis of large intestine without perforation or abscess without bleeding: Secondary | ICD-10-CM | POA: Insufficient documentation

## 2017-02-05 DIAGNOSIS — Z79899 Other long term (current) drug therapy: Secondary | ICD-10-CM | POA: Insufficient documentation

## 2017-02-05 DIAGNOSIS — Z9889 Other specified postprocedural states: Secondary | ICD-10-CM | POA: Diagnosis not present

## 2017-02-05 DIAGNOSIS — Z9049 Acquired absence of other specified parts of digestive tract: Secondary | ICD-10-CM | POA: Diagnosis not present

## 2017-02-05 DIAGNOSIS — C9000 Multiple myeloma not having achieved remission: Secondary | ICD-10-CM | POA: Diagnosis not present

## 2017-02-05 DIAGNOSIS — R001 Bradycardia, unspecified: Secondary | ICD-10-CM | POA: Insufficient documentation

## 2017-02-05 DIAGNOSIS — D509 Iron deficiency anemia, unspecified: Secondary | ICD-10-CM | POA: Insufficient documentation

## 2017-02-05 DIAGNOSIS — Z880 Allergy status to penicillin: Secondary | ICD-10-CM | POA: Insufficient documentation

## 2017-02-05 DIAGNOSIS — D508 Other iron deficiency anemias: Secondary | ICD-10-CM

## 2017-02-05 DIAGNOSIS — Z882 Allergy status to sulfonamides status: Secondary | ICD-10-CM | POA: Insufficient documentation

## 2017-02-05 DIAGNOSIS — D472 Monoclonal gammopathy: Secondary | ICD-10-CM

## 2017-02-05 DIAGNOSIS — E119 Type 2 diabetes mellitus without complications: Secondary | ICD-10-CM | POA: Insufficient documentation

## 2017-02-05 DIAGNOSIS — Z7982 Long term (current) use of aspirin: Secondary | ICD-10-CM | POA: Diagnosis not present

## 2017-02-05 DIAGNOSIS — J45909 Unspecified asthma, uncomplicated: Secondary | ICD-10-CM | POA: Diagnosis not present

## 2017-02-05 DIAGNOSIS — E7800 Pure hypercholesterolemia, unspecified: Secondary | ICD-10-CM | POA: Diagnosis not present

## 2017-02-05 DIAGNOSIS — Z8719 Personal history of other diseases of the digestive system: Secondary | ICD-10-CM | POA: Insufficient documentation

## 2017-02-05 DIAGNOSIS — K219 Gastro-esophageal reflux disease without esophagitis: Secondary | ICD-10-CM | POA: Insufficient documentation

## 2017-02-05 DIAGNOSIS — I10 Essential (primary) hypertension: Secondary | ICD-10-CM | POA: Diagnosis not present

## 2017-02-05 DIAGNOSIS — D649 Anemia, unspecified: Secondary | ICD-10-CM

## 2017-02-05 LAB — COMPREHENSIVE METABOLIC PANEL
ALT: 31 U/L (ref 14–54)
AST: 29 U/L (ref 15–41)
Albumin: 4 g/dL (ref 3.5–5.0)
Alkaline Phosphatase: 60 U/L (ref 38–126)
Anion gap: 8 (ref 5–15)
BUN: 17 mg/dL (ref 6–20)
CHLORIDE: 102 mmol/L (ref 101–111)
CO2: 27 mmol/L (ref 22–32)
CREATININE: 0.94 mg/dL (ref 0.44–1.00)
Calcium: 9.3 mg/dL (ref 8.9–10.3)
GFR calc Af Amer: >60 mL/min (ref >60)
GFR, EST NON AFRICAN AMERICAN: 57 mL/min — AB (ref >60)
Glucose, Bld: 136 mg/dL — ABNORMAL HIGH (ref 65–99)
POTASSIUM: 3.9 mmol/L (ref 3.5–5.1)
SODIUM: 137 mmol/L (ref 135–145)
Total Bilirubin: 0.4 mg/dL (ref 0.3–1.2)
Total Protein: 7.8 g/dL (ref 6.5–8.1)

## 2017-02-05 LAB — FERRITIN: FERRITIN: 35 ng/mL (ref 11–307)

## 2017-02-05 LAB — CBC WITH DIFFERENTIAL/PLATELET
Basophils Absolute: 0 10*3/uL (ref 0.0–0.1)
Basophils Relative: 0 %
Eosinophils Absolute: 0.1 10*3/uL (ref 0.0–0.7)
Eosinophils Relative: 1 %
HEMATOCRIT: 41.6 % (ref 36.0–46.0)
HEMOGLOBIN: 13.4 g/dL (ref 12.0–15.0)
LYMPHS ABS: 2.6 10*3/uL (ref 0.7–4.0)
LYMPHS PCT: 46 %
MCH: 28.8 pg (ref 26.0–34.0)
MCHC: 32.2 g/dL (ref 30.0–36.0)
MCV: 89.3 fL (ref 78.0–100.0)
MONO ABS: 0.5 10*3/uL (ref 0.1–1.0)
Monocytes Relative: 10 %
NEUTROS ABS: 2.4 10*3/uL (ref 1.7–7.7)
NEUTROS PCT: 43 %
PLATELETS: 173 10*3/uL (ref 150–400)
RBC: 4.66 MIL/uL (ref 3.87–5.11)
RDW: 14.1 % (ref 11.5–15.5)
WBC: 5.5 10*3/uL (ref 4.0–10.5)

## 2017-02-05 LAB — IRON AND TIBC
IRON: 45 ug/dL (ref 28–170)
Saturation Ratios: 12 % (ref 10.4–31.8)
TIBC: 363 ug/dL (ref 250–450)
UIBC: 318 ug/dL

## 2017-02-05 NOTE — Progress Notes (Signed)
Stacey Riley  HEMATOLOGY ONCOLOGY PROGRESS NOTE  Date of service: .02/05/2017  Patient Care Team: [facility] as PCP - General Rourk, Cristopher Estimable, MD (Gastroenterology)    Diagnosis: Iron deficiency Anemia and MGUS  Current Treatment: IV iron as needed   INTERVAL HISTORY:  Patient is here for follow-up of her iron deficiency anemia and MGUS. Patient states that continues to feel well and even better and better every day. She denies any fatigue. She denies any bleeding including hematochezia, melena, hematuria. No new pain including bone pain. No changes in appetite or weight loss. No new health problems since her last visit.  REVIEW OF SYSTEMS:    12 Point review of systems of done and is negative except as noted above.   Past Medical History:  Diagnosis Date  . Anemia 03/14/2016  . Arthritis   . Asthma   . Bradycardia   . DM (diabetes mellitus) ([place])   . GERD (gastroesophageal reflux disease)   . High cholesterol   . HTN (hypertension)     . Past Surgical History:  Procedure Laterality Date  . CHOLECYSTECTOMY  1990s  . COLONOSCOPY  03/18/1999   Dr Rourk-int  hemorrhoids, pancolonic diverticula  . COLONOSCOPY N/A 09/26/2016   Procedure: COLONOSCOPY;  Surgeon: Daneil Dolin, MD;  [facility];  Service: Endoscopy;  Laterality: N/A;  1200  . COLONOSCOPY WITH ESOPHAGOGASTRODUODENOSCOPY (EGD)  08/06/2011   Rourk: Schatzki ring, moderate sized hiatal hernia status post dilation for history of dysphagia. Pancolonic diverticula, single diminutive polyp at the base of the cecum removed (tubular adenoma). Next colonoscopy recommended for April 2018  . ESOPHAGOGASTRODUODENOSCOPY  07/02/01   Dr Rourk-Schatzki's ring s/p 52F maloney dilation, otherwise normal/small hiatal hernia/   Linear erosion and ulceration in the proximal stomach/  The ulcerated lesion in the proximal stomach  benign.This may be a [name] lesion secondary to trauma to the mucosa,  straddling the  diaphragmatic hiatus in the presence of a hiatal hernia   . ESOPHAGOGASTRODUODENOSCOPY N/A 02/14/2016   Procedure: ESOPHAGOGASTRODUODENOSCOPY (EGD);  Surgeon: Daneil Dolin, MD;  [facility];  Service: Endoscopy;  Laterality: N/A;  Venia Minks DILATION  08/06/2011   Procedure: Venia Minks DILATION;  Surgeon: Daneil Dolin, MD;  [facility]: [facility];  Service: Endoscopy;  Laterality: N/A;  . POLYPECTOMY  09/26/2016   Procedure: POLYPECTOMY;  Surgeon: Daneil Dolin, MD;  [facility]: [facility];  Service: Endoscopy;;  descending colon  . TUBAL LIGATION      . Social History  Substance Use Topics  . Smoking status: Never Smoker  . Smokeless tobacco: Never Used  . Alcohol use No    ALLERGIES:  is allergic to augmentin [amoxicillin-pot clavulanate] and sulfa antibiotics.  MEDICATIONS:  Current Outpatient Prescriptions  Medication Sig Dispense Refill  . albuterol (PROAIR HFA) 108 (90 BASE) MCG/ACT inhaler Inhale 2 puffs into the lungs every 6 (six) hours as needed for wheezing or shortness of breath.    Stacey Riley amLODipine (NORVASC) 10 MG tablet Take 10 mg by mouth daily.     Stacey Riley aspirin 81 MG tablet Take 81 mg by mouth once a week.     . hydrochlorothiazide (HYDRODIURIL) 25 MG tablet Take 25 mg by mouth daily.     Stacey Riley lisinopril (PRINIVIL,ZESTRIL) 40 MG tablet Take 40 mg by mouth daily.     Stacey Riley loratadine (CLARITIN) 10 MG tablet Take 1 tablet (10 mg total) by mouth daily. (Patient taking differently: Take 10 mg by  mouth daily at 2 PM. ) 30 tablet 0  . pantoprazole (PROTONIX) 40 MG tablet Take 1 tablet (40 mg total) by mouth 2 (two) times daily before a meal. (Patient taking differently: Take 40 mg by mouth daily before breakfast. ) 180 tablet 1  . polyethylene glycol-electrolytes (TRILYTE) 420 g solution Take 4,000 mLs by mouth as directed. 4000 mL 0  . pravastatin (PRAVACHOL) 20 MG tablet Take 20 mg by mouth at bedtime.    . sucralfate (CARAFATE) 1 g tablet TAKE (1) TABLET BY  MOUTH FOUR TIMES DAILY WITH MEALS AND AT BEDTIME. 120 tablet 0   No current facility-administered medications for this visit.     PHYSICAL EXAMINATION: ECOG PERFORMANCE STATUS: 2 - Symptomatic, <50% confined to bed  Blood pressure 174/51, pulse 52, respiratory rate 16, temperature 97.6, O2 sat 98%, weight 179 pounds  Constitutional: Well-developed, well-nourished, and in no distress.   HENT:  Head: Normocephalic and atraumatic.  Mouth/Throat: No oropharyngeal exudate. Mucosa moist. Eyes: Pupils are equal, round, and reactive to light. Conjunctivae are normal. No scleral icterus.  Neck: Normal range of motion. Neck supple. No JVD present.  Cardiovascular: Normal rate, regular rhythm and normal heart sounds.  Exam reveals no gallop and no friction rub.   No murmur heard. Pulmonary/Chest: Effort normal and breath sounds normal. No respiratory distress. No wheezes.No rales.  Abdominal: Soft. Bowel sounds are normal. No distension. There is no tenderness. There is no guarding.  Musculoskeletal: No edema or tenderness.  Lymphadenopathy:    No cervical or supraclavicular adenopathy.  Neurological: Alert and oriented to person, place, and time. No cranial nerve deficit.  Skin: Skin is warm and dry. No rash noted. No erythema. No pallor.  Psychiatric: Affect and judgment normal.    LABORATORY DATA:   I have reviewed the data as listed  . CBC Latest Ref Rng & Units 02/05/2017 10/06/2016 06/30/2016  WBC 4.0 - 10.5 K/uL 5.5 7.0 6.9  Hemoglobin 12.0 - 15.0 g/dL 13.4 13.8 12.6  Hematocrit 36.0 - 46.0 % 41.6 42.2 40.0  Platelets 150 - 400 K/uL 173 194 202    . CMP Latest Ref Rng & Units 10/06/2016 06/30/2016 05/29/2016  Glucose 65 - 99 mg/dL 96 104(H) 94  BUN 6 - 20 mg/dL 27(H) 19 18  Creatinine 0.44 - 1.00 mg/dL 1.50(H) 1.09(H) 0.94  Sodium 135 - 145 mmol/L 136 133(L) 138  Potassium 3.5 - 5.1 mmol/L 3.4(L) 3.6 3.5  Chloride 101 - 111 mmol/L 99(L) 97(L) 99(L)  CO2 22 - 32 mmol/L '27 28 30    '$ Calcium 8.9 - 10.3 mg/dL 9.2 9.0 9.6  Total Protein 6.5 - 8.1 g/dL 8.1 - -  Total Bilirubin 0.3 - 1.2 mg/dL 0.5 - -  Alkaline Phos 38 - 126 U/L 61 - -  AST 15 - 41 U/L 24 - -  ALT 14 - 54 U/L 24 - -   . Lab Results  Component Value Date   IRON 74 10/06/2016   TIBC 356 10/06/2016   IRONPCTSAT 21 10/06/2016   (Iron and TIBC)  Lab Results  Component Value Date   FERRITIN 43 10/06/2016   Component     Latest Ref Rng & Units 05/29/2016 05/29/2016 05/29/2016        12:40 PM 12:40 PM 12:40 PM  Total Protein ELP     6.0 - 8.5 g/dL  8.2 8.2  Albumin ELP     2.9 - 4.4 g/dL   3.9  Alpha-1-Globulin     0.0 -  0.4 g/dL   0.3  Alpha-2-Globulin     0.4 - 1.0 g/dL   1.0  Beta Globulin     0.7 - 1.3 g/dL   1.1  Gamma Globulin     0.4 - 1.8 g/dL   1.9 (H)  M-SPIKE, %     Not Observed g/dL   1.1 (H)  SPE Interp.        Comment  Comment        Comment  Globulin, Total     2.2 - 3.9 g/dL   4.3 (H)  A/G Ratio     0.7 - 1.7   0.9  IgG (Immunoglobin G), Serum     700 - 1,600 mg/dL 1,877 (H) 1,885 (H)   IgA     64 - 422 mg/dL 149 156   IgM, Serum     26 - 217 mg/dL 86 83   Immunofixation Result, Serum       Comment   Kappa free light chain     3.3 - 19.4 mg/L 20.5 (H)    Lamda free light chains     5.7 - 26.3 mg/L 20.5    Kappa, lamda light chain ratio     0.26 - 1.65 1.00    Results for NATOYA, VISCOMI (MRN [id number]) as of 02/05/2017 14:11  Ref. Range 10/06/2016 14:24  Total Protein ELP Latest Ref Range: 6.0 - 8.5 g/dL 7.8  Albumin SerPl Elph-Mcnc Latest Ref Range: 2.9 - 4.4 g/dL 4.0  Albumin/Glob SerPl Latest Ref Range: 0.7 - 1.7  1.1  Alpha2 Glob SerPl Elph-Mcnc Latest Ref Range: 0.4 - 1.0 g/dL 0.9  Alpha 1 Latest Ref Range: 0.0 - 0.4 g/dL 0.2  Gamma Glob SerPl Elph-Mcnc Latest Ref Range: 0.4 - 1.8 g/dL 1.7  IFE 1 Unknown Comment  Globulin, Total Latest Ref Range: 2.2 - 3.9 g/dL 3.8  B-Globulin SerPl Elph-Mcnc Latest Ref Range: 0.7 - 1.3 g/dL 1.0  IgG (Immunoglobin G),  Serum Latest Ref Range: 700 - 1,600 mg/dL 1,538  IgA Latest Ref Range: 64 - 422 mg/dL 121  IgM, Serum Latest Ref Range: 26 - 217 mg/dL 63  Kappa free light chain Latest Ref Range: 3.3 - 19.4 mg/L 19.1  Lamda free light chains Latest Ref Range: 5.7 - 26.3 mg/L 19.5  Kappa, lamda light chain ratio Latest Ref Range: 0.26 - 1.65  0.98  M Protein SerPl Elph-Mcnc Latest Ref Range: Not Observed g/dL 0.9 (H)    Immunofixation electrophoresis      The value has a corrected status.      No reference range information available      Resulting Lab: [facility]      Comments:           (NOTE)           Immunofixation shows IgG monoclonal protein with lambda            light chain           specificity.  RADIOGRAPHIC STUDIES: I have personally reviewed the radiological images as listed and agreed with the findings in the report. No results found.  ASSESSMENT & PLAN:   76 y.o. female with   #1 Iron deficiency anemia  Recent colonoscopy on 09/26/16 was negative for evidence of bleeding or malignancy. GI workup S/P EGD by Dr. 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  Colonoscopy by Dr. Gala Romney in 2012 demonstrated a normal rectum, colonic diverticulosis,  and a single cecal polyp that was removed and negative for malignancy/dysplasia.  Hemoglobin is stable  at 13.4 g/dL today. Iron studies are pending at this time.  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 Patient had her SPEP done on 05/29/2016 that showed an IgG level of 1900  with an M protein of 1.1 g/dL. Last SPEP in June 2018 demonstrated M spike of 0.9 g/dL with a normal K/L free light chain ratio . No significant anemia at this time, no hypercalcemia or change in kidney function and no focal new bone pains . Continue observation of her labs at this time. Has a 1% chance per year of progression to multiple myeloma. Her skeletal survey was negative for lytic  lesions. Awaiting her myeloma labs from today. Repeat myeloma labs on next visit.   Return to clinic in 4 months for follow-up with repeat lab work as stated below.  Orders Placed This Encounter  Procedures  . CBC with Differential    Standing Status:   Future    Standing Expiration Date:   02/05/2018  . Comprehensive metabolic panel    Standing Status:   Future    Standing Expiration Date:   02/05/2018  . Iron and TIBC    Standing Status:   Future    Standing Expiration Date:   02/05/2018  . Ferritin    Standing Status:   Future    Standing Expiration Date:   02/05/2018  . Multiple Myeloma Panel (SPEP&IFE w/QIG)    Standing Status:   Future    Standing Expiration Date:   02/05/2018  . Kappa/lambda light chains    Standing Status:   Future    Standing Expiration Date:   02/05/2018   Twana First, MD

## 2017-02-06 LAB — MULTIPLE MYELOMA PANEL, SERUM
ALBUMIN SERPL ELPH-MCNC: 3.7 g/dL (ref 2.9–4.4)
Albumin/Glob SerPl: 1 (ref 0.7–1.7)
Alpha 1: 0.2 g/dL (ref 0.0–0.4)
Alpha2 Glob SerPl Elph-Mcnc: 0.8 g/dL (ref 0.4–1.0)
B-GLOBULIN SERPL ELPH-MCNC: 1 g/dL (ref 0.7–1.3)
Gamma Glob SerPl Elph-Mcnc: 1.7 g/dL (ref 0.4–1.8)
Globulin, Total: 3.8 g/dL (ref 2.2–3.9)
IGA: 131 mg/dL (ref 64–422)
IGM (IMMUNOGLOBULIN M), SRM: 70 mg/dL (ref 26–217)
IgG (Immunoglobin G), Serum: 1672 mg/dL — ABNORMAL HIGH (ref 700–1600)
M PROTEIN SERPL ELPH-MCNC: 1 g/dL — AB
TOTAL PROTEIN ELP: 7.5 g/dL (ref 6.0–8.5)

## 2017-02-06 LAB — KAPPA/LAMBDA LIGHT CHAINS
KAPPA, LAMDA LIGHT CHAIN RATIO: 0.77 (ref 0.26–1.65)
Kappa free light chain: 17.9 mg/L (ref 3.3–19.4)
Lambda free light chains: 23.2 mg/L (ref 5.7–26.3)

## 2017-02-06 LAB — BETA 2 MICROGLOBULIN, SERUM: BETA 2 MICROGLOBULIN: 1.8 mg/L (ref 0.6–2.4)

## 2017-03-10 ENCOUNTER — Ambulatory Visit: Payer: Medicare HMO | Admitting: Gastroenterology

## 2017-03-10 ENCOUNTER — Encounter (HOSPITAL_COMMUNITY): Payer: Self-pay | Admitting: *Deleted

## 2017-03-10 ENCOUNTER — Encounter: Payer: Self-pay | Admitting: Gastroenterology

## 2017-03-10 ENCOUNTER — Emergency Department (HOSPITAL_COMMUNITY): Payer: Medicare HMO

## 2017-03-10 ENCOUNTER — Emergency Department (HOSPITAL_COMMUNITY)
Admission: EM | Admit: 2017-03-10 | Discharge: 2017-03-10 | Disposition: A | Discharge status: Home / Self Care | Payer: Medicare HMO | Attending: Emergency Medicine | Admitting: Emergency Medicine

## 2017-03-10 ENCOUNTER — Other Ambulatory Visit: Payer: Self-pay

## 2017-03-10 VITALS — BP 142/70 | HR 55 | Temp 97.0°F | Ht 64.0 in | Wt 173.6 lb

## 2017-03-10 DIAGNOSIS — J45909 Unspecified asthma, uncomplicated: Secondary | ICD-10-CM | POA: Insufficient documentation

## 2017-03-10 DIAGNOSIS — D649 Anemia, unspecified: Secondary | ICD-10-CM | POA: Diagnosis not present

## 2017-03-10 DIAGNOSIS — Z7982 Long term (current) use of aspirin: Secondary | ICD-10-CM | POA: Insufficient documentation

## 2017-03-10 DIAGNOSIS — R079 Chest pain, unspecified: Secondary | ICD-10-CM | POA: Diagnosis present

## 2017-03-10 DIAGNOSIS — K219 Gastro-esophageal reflux disease without esophagitis: Secondary | ICD-10-CM

## 2017-03-10 DIAGNOSIS — I10 Essential (primary) hypertension: Secondary | ICD-10-CM | POA: Diagnosis not present

## 2017-03-10 DIAGNOSIS — K2100 Gastro-esophageal reflux disease with esophagitis, without bleeding: Secondary | ICD-10-CM

## 2017-03-10 DIAGNOSIS — Z79899 Other long term (current) drug therapy: Secondary | ICD-10-CM | POA: Insufficient documentation

## 2017-03-10 DIAGNOSIS — E119 Type 2 diabetes mellitus without complications: Secondary | ICD-10-CM | POA: Insufficient documentation

## 2017-03-10 DIAGNOSIS — K449 Diaphragmatic hernia without obstruction or gangrene: Secondary | ICD-10-CM | POA: Diagnosis not present

## 2017-03-10 LAB — HEPATIC FUNCTION PANEL
ALK PHOS: 60 U/L (ref 38–126)
ALT: 34 U/L (ref 14–54)
AST: 32 U/L (ref 15–41)
Albumin: 4.4 g/dL (ref 3.5–5.0)
BILIRUBIN DIRECT: 0.1 mg/dL (ref 0.1–0.5)
BILIRUBIN TOTAL: 0.5 mg/dL (ref 0.3–1.2)
Indirect Bilirubin: 0.4 mg/dL (ref 0.3–0.9)
TOTAL PROTEIN: 8.2 g/dL — AB (ref 6.5–8.1)

## 2017-03-10 LAB — CBC
HEMATOCRIT: 44.6 % (ref 36.0–46.0)
HEMOGLOBIN: 14 g/dL (ref 12.0–15.0)
MCH: 28.1 pg (ref 26.0–34.0)
MCHC: 31.4 g/dL (ref 30.0–36.0)
MCV: 89.6 fL (ref 78.0–100.0)
Platelets: 176 10*3/uL (ref 150–400)
RBC: 4.98 MIL/uL (ref 3.87–5.11)
RDW: 13.9 % (ref 11.5–15.5)
WBC: 6.4 10*3/uL (ref 4.0–10.5)

## 2017-03-10 LAB — BASIC METABOLIC PANEL
ANION GAP: 8 (ref 5–15)
BUN: 12 mg/dL (ref 6–20)
CALCIUM: 9.7 mg/dL (ref 8.9–10.3)
CO2: 29 mmol/L (ref 22–32)
Chloride: 100 mmol/L — ABNORMAL LOW (ref 101–111)
Creatinine, Ser: 0.97 mg/dL (ref 0.44–1.00)
GFR calc Af Amer: >60 mL/min (ref >60)
GFR, EST NON AFRICAN AMERICAN: 55 mL/min — AB (ref >60)
Glucose, Bld: 145 mg/dL — ABNORMAL HIGH (ref 65–99)
POTASSIUM: 3.7 mmol/L (ref 3.5–5.1)
SODIUM: 137 mmol/L (ref 135–145)

## 2017-03-10 LAB — I-STAT TROPONIN, ED: TROPONIN I, POC: 0 ng/mL (ref 0.00–0.08)

## 2017-03-10 LAB — LIPASE, BLOOD: Lipase: 21 U/L (ref 11–51)

## 2017-03-10 LAB — TROPONIN I: Troponin I: <0.03 ng/mL (ref <0.03)

## 2017-03-10 MED ORDER — PANTOPRAZOLE SODIUM 40 MG IV SOLR
40.0000 mg | Freq: Once | INTRAVENOUS | Status: AC
  Administered 2017-03-10: 40 mg via INTRAVENOUS
  Filled 2017-03-10: qty 40

## 2017-03-10 MED ORDER — SUCRALFATE 1 G PO TABS: 1.0000 g | ORAL_TABLET | Freq: Three times a day (TID) | ORAL | 0 refills | Status: DC

## 2017-03-10 MED ORDER — GI COCKTAIL ~~LOC~~
30.0000 mL | Freq: Once | ORAL | Status: AC
  Administered 2017-03-10: 30 mL via ORAL
  Filled 2017-03-10: qty 30

## 2017-03-10 NOTE — Discharge Instructions (Signed)
Follow up with your gi md now as planned.  Show them the new prescription you got today

## 2017-03-10 NOTE — ED Triage Notes (Signed)
Pt states mid chest pain for the past two days & her blood pressure has been elevated.

## 2017-03-10 NOTE — ED Notes (Signed)
Notified Dr Christy Gentles that pt's HR on monitor drops to mid 40's but unable to capture on EKG before it goes back up.

## 2017-03-10 NOTE — ED Provider Notes (Signed)
[facility] EMERGENCY DEPARTMENT Provider Note   CSN: [id number] Arrival date & time: 03/10/17  3818     History   Chief Complaint Chief Complaint  Patient presents with  . Chest Pain    HPI Stacey Riley is a 76 y.o. female.  Patient complains of a burning in her chest.  No shortness of breath or sweating   The history is provided by the patient.  Chest Pain   This is a new problem. The current episode started 12 to 24 hours ago. The problem occurs daily. The problem has been resolved. Associated with: Unknown. The pain is present in the substernal region. The pain is at a severity of 2/10. The pain is mild. The quality of the pain is described as burning. Pertinent negatives include no abdominal pain, no back pain, no cough and no headaches.  Pertinent negatives for past medical history include no seizures.    Past Medical History:  Diagnosis Date  . Anemia 03/14/2016  . Arthritis   . Asthma   . Bradycardia   . DM (diabetes mellitus) ([place])   . GERD (gastroesophageal reflux disease)   . High cholesterol   . HTN (hypertension)     Patient Active Problem List   Diagnosis Date Noted  . 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 History of colonic polyps 08/19/2016  . Anemia 03/14/2016  . UGIB (upper gastrointestinal bleed) 02/14/2016  . Diabetes ([place]) 02/14/2016  . Essential hypertension 02/14/2016  . Hyperlipidemia 02/14/2016  . Acute bronchitis 02/14/2016  . GERD (gastroesophageal reflux disease) 07/14/2011  . Screen for colon cancer 07/14/2011  . Dysphagia 07/14/2011    Past Surgical History:  Procedure Laterality Date  . CHOLECYSTECTOMY  1990s  . COLONOSCOPY  03/18/1999   Dr Rourk-int  hemorrhoids, pancolonic diverticula  . COLONOSCOPY N/A 09/26/2016   Procedure: COLONOSCOPY;  Surgeon: Daneil Dolin, MD;  [facility];  Service: Endoscopy;  Laterality: N/A;  1200  . COLONOSCOPY WITH ESOPHAGOGASTRODUODENOSCOPY (EGD)   08/06/2011   Rourk: Schatzki ring, moderate sized hiatal hernia status post dilation for history of dysphagia. Pancolonic diverticula, single diminutive polyp at the base of the cecum removed (tubular adenoma). Next colonoscopy recommended for April 2018  . ESOPHAGOGASTRODUODENOSCOPY  07/02/01   Dr Rourk-Schatzki's ring s/p 80F maloney dilation, otherwise normal/small hiatal hernia/   Linear erosion and ulceration in the proximal stomach/  The ulcerated lesion in the proximal stomach  benign.This may be a [name] lesion secondary to trauma to the mucosa, straddling the  diaphragmatic hiatus in the presence of a hiatal hernia   . ESOPHAGOGASTRODUODENOSCOPY N/A 02/14/2016   Procedure: ESOPHAGOGASTRODUODENOSCOPY (EGD);  Surgeon: Daneil Dolin, MD;  [facility];  Service: Endoscopy;  Laterality: N/A;  Venia Minks DILATION  08/06/2011   Procedure: Venia Minks DILATION;  Surgeon: Daneil Dolin, MD;  [facility];  Service: Endoscopy;  Laterality: N/A;  . POLYPECTOMY  09/26/2016   Procedure: POLYPECTOMY;  Surgeon: Daneil Dolin, MD;  [facility];  Service: Endoscopy;;  descending colon  . TUBAL LIGATION      OB History    No data available       Home Medications    Prior to Admission medications   Medication Sig Start Date End Date Taking? Authorizing Provider  albuterol (PROAIR HFA) 108 (90 BASE) MCG/ACT inhaler Inhale 2 puffs into the lungs every 6 (six) hours as needed for wheezing or shortness of breath.   Yes [provider]  amLODipine (NORVASC) 10 MG tablet Take 10 mg by mouth daily.  04/22/11  Yes [provider]  aspirin 81 MG tablet Take 81 mg by mouth once a week.    Yes [provider]  hydrochlorothiazide (HYDRODIURIL) 25 MG tablet Take 25 mg by mouth daily.  04/22/11  Yes [provider]  lisinopril (PRINIVIL,ZESTRIL) 40 MG tablet Take 40 mg by mouth daily.  04/22/11  Yes [provider]  loratadine (CLARITIN) 10  MG tablet Take 1 tablet (10 mg total) by mouth daily. Patient taking differently: Take 10 mg by mouth daily at 2 PM.  02/16/16  Yes Thurnell Lose, MD  pantoprazole (PROTONIX) 40 MG tablet Take 1 tablet (40 mg total) by mouth 2 (two) times daily before a meal. Patient taking differently: Take 40 mg by mouth daily before breakfast.  03/14/16  Yes Carlis Stable, NP  pravastatin (PRAVACHOL) 20 MG tablet Take 20 mg by mouth at bedtime.   Yes [provider]  sucralfate (CARAFATE) 1 g tablet Take 1 tablet (1 g total) by mouth 4 (four) times daily -  with meals and at bedtime. 03/10/17   Milton Ferguson, MD    Family History Family History  Problem Relation Age of Onset  . Aneurysm Daughter   . Stroke Daughter   . Diabetes Daughter   . Hypertension Mother   . Dementia Mother   . Hypertension Father   . Kidney cancer Brother   . Cancer Brother        brain  . Cancer Brother        bone  . Stroke Brother   . Hypertension Daughter   . Hypertension Son   . Hyperlipidemia Son   . Colon cancer Neg Hx   . Inflammatory bowel disease Neg Hx     Social History Social History   Tobacco Use  . Smoking status: Never Smoker  . Smokeless tobacco: Never Used  Substance Use Topics  . Alcohol use: No  . Drug use: No     Allergies   Augmentin [amoxicillin-pot clavulanate] and Sulfa antibiotics   Review of Systems Review of Systems  Constitutional: Negative for appetite change and fatigue.  HENT: Negative for congestion, ear discharge and sinus pressure.   Eyes: Negative for discharge.  Respiratory: Negative for cough.   Cardiovascular: Positive for chest pain.  Gastrointestinal: Negative for abdominal pain and diarrhea.  Genitourinary: Negative for frequency and hematuria.  Musculoskeletal: Negative for back pain.  Skin: Negative for rash.  Neurological: Negative for seizures and headaches.  Psychiatric/Behavioral: Negative for hallucinations.     Physical Exam Updated  Vital Signs BP (!) 144/54   Pulse (!) 47   Temp 97.6 F (36.4 C) (Oral)   Resp 17   Ht 5\' 4"  (1.626 m)   Wt 80.3 kg (177 lb)   SpO2 100%   BMI 30.38 kg/m   Physical Exam  Constitutional: She is oriented to person, place, and time. She appears well-developed.  HENT:  Head: Normocephalic.  Eyes: Conjunctivae and EOM are normal. No scleral icterus.  Neck: Neck supple. No thyromegaly present.  Cardiovascular: Normal rate and regular rhythm. Exam reveals no gallop and no friction rub.  No murmur heard. Pulmonary/Chest: No stridor. She has no wheezes. She has no rales. She exhibits no tenderness.  Abdominal: She exhibits no distension. There is no tenderness. There is no rebound.  Musculoskeletal: Normal range of motion. She exhibits no edema.  Lymphadenopathy:  She has no cervical adenopathy.  Neurological: She is oriented to person, place, and time. She exhibits normal muscle tone. Coordination normal.  Skin: No rash noted. No erythema.  Psychiatric: She has a normal mood and affect. Her behavior is normal.     ED Treatments / Results  Labs (all labs ordered are listed, but only abnormal results are displayed) Labs Reviewed  BASIC METABOLIC PANEL - Abnormal; Notable for the following components:      Result Value   Chloride 100 (*)    Glucose, Bld 145 (*)    GFR calc non Af Amer 55 (*)    All other components within normal limits  HEPATIC FUNCTION PANEL - Abnormal; Notable for the following components:   Total Protein 8.2 (*)    All other components within normal limits  CBC  LIPASE, BLOOD  TROPONIN I  I-STAT TROPONIN, ED    EKG  EKG Interpretation  Date/Time:  Tuesday March 10 2017 06:34:57 EST Ventricular Rate:  76 PR Interval:    QRS Duration: 103 QT Interval:  407 QTC Calculation: 458 R Axis:   -23 Text Interpretation:  Sinus rhythm Probable LVH with secondary repol abnrm Baseline wander in lead(s) II III aVF V6 Interpretation limited secondary to  artifact Confirmed by Ripley Fraise [phone]) on 03/10/2017 6:38:35 AM Also confirmed by Milton Ferguson [id number])  on 03/10/2017 7:24:27 AM       Radiology Dg Chest 2 View  Result Date: 03/10/2017 CLINICAL DATA:  Chest pain EXAM: CHEST  2 VIEW COMPARISON:  09/23/2016 FINDINGS: Heart size and vascularity normal. Lungs are clear without infiltrate effusion or mass. Moderately large hiatal hernia. Thoracic dextroscoliosis. Apical scarring bilaterally. IMPRESSION: Moderate hiatal hernia unchanged. No acute cardiopulmonary abnormality. Electronically Signed   By: Franchot Gallo M.D.   On: 03/10/2017 07:15    Procedures Procedures (including critical care time)  Medications Ordered in ED Medications  gi cocktail (Maalox,Lidocaine,Donnatal) (30 mLs Oral Given 03/10/17 0747)  pantoprazole (PROTONIX) injection 40 mg (40 mg Intravenous Given 03/10/17 0741)     Initial Impression / Assessment and Plan / ED Course  I have reviewed the triage vital signs and the nursing notes.  Pertinent labs & imaging results that were available during my care of the patient were reviewed by me and considered in my medical decision making (see chart for details).     Labs including 2 troponins are negative.  EKG shows no acute changes.  Doubt coronary disease and suspect GERD.  Patient will be given a prescription for Carafate but she seen the GI doctor after this appointment  Final Clinical Impressions(s) / ED Diagnoses   Final diagnoses:  Gastroesophageal reflux disease with esophagitis    ED Discharge Orders        Ordered    sucralfate (CARAFATE) 1 g tablet  3 times daily with meals & bedtime     03/10/17 1053       Milton Ferguson, MD 03/10/17 1056

## 2017-03-10 NOTE — Progress Notes (Signed)
Primary Care Physician: [facility]  Primary Gastroenterologist:  [name], MD   Chief Complaint  Patient presents with  . Anemia    f/u. Doing okay  . Gastroesophageal Reflux    C/o CP.     HPI: Stacey Riley is a 76 y.o. female here for follow-up of GERD, anemia.  She was seen back in May for follow-up of anemia noted to be due for surveillance colonoscopy.  She had diverticulosis and a single benign polyp removed.  No further surveillance colonoscopies recommended given her age.    She has a history of chronic anemia requiring IV iron infusion, followed by hematology.  History of Stacey Riley lesions and large hiatal hernia noted on EGD in November 2017 when she presented for upper GI bleeding.  Was put on Carafate at the time.  She called back in July warning refill and advised to come off medications because she was doing well.  Patient states over the last 2 months she has been having some substernal chest pain particularly when she lays down at night.  Of note she was also reduced on her pantoprazole down to once daily.  Over the weekend she had some increase in blood pressure stating her systolic was over 026.  She had headache.  She was having pain in the substernal region and radiating into the left chest and shoulder.  She got concerned so she went to the ED and actually just left and came straight to today's appointment.  She notes GI cocktail seemed to help.  Troponin x2 was negative.  Labs unremarkable.  She was told it was likely reflux in the follow-up with [place].  She was given a prescription for Carafate tablets to take before meals and at bedtime for the next 4 weeks.  Last couple of days her appetite has been poor.  Denies vomiting.  Bowel movements are regular.  No blood in the stool or melena.  Denies abdominal pain.  No dysphagia.  She feels like her symptoms of chest discomfort have been worse with decreasing her Protonix to once daily as  well as with increased blood pressures.    Current Outpatient Medications  Medication Sig Dispense Refill  . albuterol (PROAIR HFA) 108 (90 BASE) MCG/ACT inhaler Inhale 2 puffs into the lungs every 6 (six) hours as needed for wheezing or shortness of breath.    Marland Kitchen amLODipine (NORVASC) 10 MG tablet Take 10 mg by mouth daily.     Marland Kitchen aspirin 81 MG tablet Take 81 mg by mouth once a week.     . hydrochlorothiazide (HYDRODIURIL) 25 MG tablet Take 25 mg by mouth daily.     Marland Kitchen lisinopril (PRINIVIL,ZESTRIL) 40 MG tablet Take 40 mg by mouth daily.     Marland Kitchen loratadine (CLARITIN) 10 MG tablet Take 1 tablet (10 mg total) by mouth daily. (Patient taking differently: Take 10 mg by mouth daily at 2 PM. ) 30 tablet 0  . pantoprazole (PROTONIX) 40 MG tablet Take 1 tablet (40 mg total) by mouth 2 (two) times daily before a meal. (Patient taking differently: Take 40 mg by mouth daily before breakfast. ) 180 tablet 1  . pravastatin (PRAVACHOL) 20 MG tablet Take 20 mg by mouth at bedtime.    . sucralfate (CARAFATE) 1 g tablet Take 1 tablet (1 g total) by mouth 4 (four) times daily -  with meals and at bedtime. 120 tablet 0   No current facility-administered medications for  this visit.     Allergies as of 03/10/2017 - Review Complete 03/10/2017  Allergen Reaction Noted  . Augmentin [amoxicillin-pot clavulanate] Nausea And Vomiting 02/14/2016  . Sulfa antibiotics Swelling 07/14/2011    ROS:  General: Negative for anorexia, weight loss, fever, chills, fatigue, weakness. ENT: Negative for hoarseness, difficulty swallowing , nasal congestion. CV: Negative for  angina, palpitations, dyspnea on exertion, peripheral edema. See hpi Respiratory: Negative for dyspnea at rest, dyspnea on exertion, cough, sputum, wheezing.  GI: See history of present illness. GU:  Negative for dysuria, hematuria, urinary incontinence, urinary frequency, nocturnal urination.  Endo: Negative for unusual weight change.    Physical  Examination:   BP (!) 142/70 (BP Location: Left Arm, Cuff Size: Normal)   Pulse (!) 55   Temp (!) 97 F (36.1 C) (Oral)   Ht 5\' 4"  (1.626 m)   Wt 173 lb 9.6 oz (78.7 kg)   BMI 29.80 kg/m   General: Well-nourished, well-developed in no acute distress.  Eyes: No icterus. Mouth: Oropharyngeal mucosa moist and pink , no lesions erythema or exudate. Lungs: Clear to auscultation bilaterally.  Heart: Regular rate and rhythm, no murmurs rubs or gallops.  Abdomen: Bowel sounds are normal, nontender, nondistended, no hepatosplenomegaly or masses, no abdominal bruits or hernia , no rebound or guarding.   Extremities: No lower extremity edema. No clubbing or deformities. Neuro: Alert and oriented x 4   Skin: Warm and dry, no jaundice.   Psych: Alert and cooperative, normal mood and affect.  Labs:  Lab Results  Component Value Date   CREATININE 0.97 03/10/2017   BUN 12 03/10/2017   NA 137 03/10/2017   K 3.7 03/10/2017   CL 100 (L) 03/10/2017   CO2 29 03/10/2017   Lab Results  Component Value Date   WBC 6.4 03/10/2017   HGB 14.0 03/10/2017   HCT 44.6 03/10/2017   MCV 89.6 03/10/2017   PLT 176 03/10/2017   Lab Results  Component Value Date   LIPASE 21 03/10/2017   Lab Results  Component Value Date   ALT 34 03/10/2017   AST 32 03/10/2017   ALKPHOS 60 03/10/2017   BILITOT 0.5 03/10/2017   Lab Results  Component Value Date   TROPONINI <0.03 03/10/2017   Lab Results  Component Value Date   IRON 45 02/05/2017   TIBC 363 02/05/2017   FERRITIN 35 02/05/2017    Imaging Studies: Dg Chest 2 View  Result Date: 03/10/2017 CLINICAL DATA:  Chest pain EXAM: CHEST  2 VIEW COMPARISON:  09/23/2016 FINDINGS: Heart size and vascularity normal. Lungs are clear without infiltrate effusion or mass. Moderately large hiatal hernia. Thoracic dextroscoliosis. Apical scarring bilaterally. IMPRESSION: Moderate hiatal hernia unchanged. No acute cardiopulmonary abnormality. Electronically Signed    By: Franchot Gallo M.D.   On: 03/10/2017 07:15

## 2017-03-10 NOTE — Patient Instructions (Addendum)
1. Increase pantoprazole to 40mg  30 minutes before breakfast and 40mg  30 minutes before your evening meal.  2. Crush carafate tablet in 5 teaspoons of water and make a slurry. Take before meals and at bedtime per prescription.  3. Return to office in 2 months. Call sooner if needed. 4. Follow up with your PCP regarding your blood pressures. Initial blood pressure today was 179/68 but when rechecked was 142/70.

## 2017-03-11 NOTE — Assessment & Plan Note (Signed)
H/o GERD and large hiatal hernia with [name] lesions. Noticed decline in management of her GERD with reduction in pantoprazole to once daily dosing. ED visit for chest pain as outlined. Patient to follow up with PCP regarding blood pressure concerns. Patient reports BPs fluctuate quite a bit into dangerous highs. Develops headache when this occurs. Cannot rule out some of her symptoms due to this.   We will increase pantoprazole to BID. She will complete carafate as prescribed by ED although will make into a suspensions. Return to the office in 2 months or call sooner if problems.

## 2017-03-11 NOTE — Progress Notes (Signed)
cc'ed to pcp °

## 2017-03-11 NOTE — Assessment & Plan Note (Signed)
Continue to follow with hematology. Hgb looks good at this time.

## 2017-05-12 ENCOUNTER — Encounter: Payer: Self-pay | Admitting: Gastroenterology

## 2017-05-12 ENCOUNTER — Ambulatory Visit: Payer: Medicare HMO | Admitting: Gastroenterology

## 2017-05-12 VITALS — BP 126/61 | HR 54 | Temp 97.1°F | Ht 64.0 in | Wt 171.4 lb

## 2017-05-12 DIAGNOSIS — K219 Gastro-esophageal reflux disease without esophagitis: Secondary | ICD-10-CM

## 2017-05-12 NOTE — Patient Instructions (Addendum)
1. Continue pantoprazole twice daily before a meal.  2. Continue to follow with the blood specialist regarding anemia.  3. See you back in six months or call sooner if needed.

## 2017-05-12 NOTE — Progress Notes (Signed)
Primary Care Physician: [facility]  Primary Gastroenterologist:  [name], MD   Chief Complaint  Patient presents with  . Gastroesophageal Reflux    doing better    HPI: Stacey Riley is a 77 y.o. female here follow-up of GERD.  Last seen November 2018.She has a history of chronic anemia requiring IV iron infusion, followed by hematology.  History of [name] lesions and large hiatal hernia noted on EGD in November 2017 when she presented for upper GI bleeding.  At last office visit she was having nocturnal symptoms, pantoprazole was increased to twice daily.  clinically she is doing well.  Denies abdominal pain, heartburn, dysphagia, vomiting, constipation, diarrhea, melena, rectal bleeding.  Trying to follow antireflux measures.  If she eats too late she pays for.  Sees hematology next month for blood work.   Current Outpatient Medications  Medication Sig Dispense Refill  . albuterol (PROAIR HFA) 108 (90 BASE) MCG/ACT inhaler Inhale 2 puffs into the lungs every 6 (six) hours as needed for wheezing or shortness of breath.    Marland Kitchen amLODipine (NORVASC) 10 MG tablet Take 10 mg by mouth daily.     Marland Kitchen aspirin 81 MG tablet Take 81 mg by mouth once a week.     . hydrochlorothiazide (HYDRODIURIL) 25 MG tablet Take 25 mg by mouth daily.     Marland Kitchen lisinopril (PRINIVIL,ZESTRIL) 40 MG tablet Take 40 mg by mouth daily.     Marland Kitchen loratadine (CLARITIN) 10 MG tablet Take 1 tablet (10 mg total) by mouth daily. (Patient taking differently: Take 10 mg by mouth daily at 2 PM. ) 30 tablet 0  . pantoprazole (PROTONIX) 40 MG tablet Take 1 tablet (40 mg total) by mouth 2 (two) times daily before a meal. (Patient taking differently: Take 40 mg by mouth 2 (two) times daily. ) 180 tablet 1  . pravastatin (PRAVACHOL) 20 MG tablet Take 20 mg by mouth at bedtime.     No current facility-administered medications for this visit.     Allergies as of 05/12/2017 - Review Complete  05/12/2017  Allergen Reaction Noted  . Augmentin [amoxicillin-pot clavulanate] Nausea And Vomiting 02/14/2016  . Sulfa antibiotics Swelling 07/14/2011    ROS:  General: Negative for anorexia, weight loss, fever, chills, fatigue, weakness. ENT: Negative for hoarseness, difficulty swallowing , nasal congestion. CV: Negative for chest pain, angina, palpitations, dyspnea on exertion, peripheral edema.  Respiratory: Negative for dyspnea at rest, dyspnea on exertion, cough, sputum, wheezing.  GI: See history of present illness. GU:  Negative for dysuria, hematuria, urinary incontinence, urinary frequency, nocturnal urination.  Endo: Negative for unusual weight change.    Physical Examination:   BP 126/61   Pulse (!) 54   Temp (!) 97.1 F (36.2 C) (Oral)   Ht 5\' 4"  (1.626 m)   Wt 171 lb 6.4 oz (77.7 kg)   BMI 29.42 kg/m   General: Well-nourished, well-developed in no acute distress.  Eyes: No icterus. Mouth: Oropharyngeal mucosa moist and pink , no lesions erythema or exudate. Lungs: Clear to auscultation bilaterally.  Heart: Regular rate and rhythm, no murmurs rubs or gallops.  Abdomen: Bowel sounds are normal, nontender, nondistended, no hepatosplenomegaly or masses, no abdominal bruits or hernia , no rebound or guarding.   Extremities: No lower extremity edema. No clubbing or deformities. Neuro: Alert and oriented x 4   Skin: Warm and dry, no jaundice.   Psych: Alert and cooperative, normal mood and affect.  Labs:  Lab Results  Component Value Date   CREATININE 0.97 03/10/2017   BUN 12 03/10/2017   NA 137 03/10/2017   K 3.7 03/10/2017   CL 100 (L) 03/10/2017   CO2 29 03/10/2017   Lab Results  Component Value Date   ALT 34 03/10/2017   AST 32 03/10/2017   ALKPHOS 60 03/10/2017   BILITOT 0.5 03/10/2017   Lab Results  Component Value Date   WBC 6.4 03/10/2017   HGB 14.0 03/10/2017   HCT 44.6 03/10/2017   MCV 89.6 03/10/2017   PLT 176 03/10/2017   Lab Results    Component Value Date   IRON 45 02/05/2017   TIBC 363 02/05/2017   FERRITIN 35 02/05/2017    Imaging Studies: No results found.

## 2017-05-12 NOTE — Assessment & Plan Note (Signed)
GERD, large hiatal hernia with history of Cameron lesions in the past.  Doing well back on pantoprazole twice daily.  Previously developed recurrent GERD symptoms/chest pain with once daily dosing, see last office note.  She will maintain pantoprazole twice daily for now.  Continue to follow with hematology regarding anemia.  Last hemoglobin normal, ferritin low normal.  Return to the office in 6 months or call sooner if needed.

## 2017-05-12 NOTE — Progress Notes (Signed)
CC'D TO PCP °

## 2017-06-08 ENCOUNTER — Other Ambulatory Visit (HOSPITAL_COMMUNITY): Payer: Self-pay | Admitting: *Deleted

## 2017-06-08 DIAGNOSIS — D472 Monoclonal gammopathy: Secondary | ICD-10-CM

## 2017-06-08 DIAGNOSIS — D649 Anemia, unspecified: Secondary | ICD-10-CM

## 2017-06-09 ENCOUNTER — Inpatient Hospital Stay (HOSPITAL_COMMUNITY): Payer: Medicare HMO | Admitting: Adult Health

## 2017-06-09 ENCOUNTER — Encounter (HOSPITAL_COMMUNITY): Payer: Self-pay | Admitting: Adult Health

## 2017-06-09 ENCOUNTER — Inpatient Hospital Stay (HOSPITAL_COMMUNITY): Payer: Medicare HMO | Attending: Internal Medicine

## 2017-06-09 ENCOUNTER — Other Ambulatory Visit: Payer: Self-pay

## 2017-06-09 VITALS — BP 157/55 | HR 52 | Temp 98.0°F | Resp 16 | Ht 64.0 in | Wt 170.6 lb

## 2017-06-09 DIAGNOSIS — Z880 Allergy status to penicillin: Secondary | ICD-10-CM | POA: Diagnosis not present

## 2017-06-09 DIAGNOSIS — Z882 Allergy status to sulfonamides status: Secondary | ICD-10-CM

## 2017-06-09 DIAGNOSIS — M1990 Unspecified osteoarthritis, unspecified site: Secondary | ICD-10-CM | POA: Insufficient documentation

## 2017-06-09 DIAGNOSIS — E7800 Pure hypercholesterolemia, unspecified: Secondary | ICD-10-CM | POA: Insufficient documentation

## 2017-06-09 DIAGNOSIS — E119 Type 2 diabetes mellitus without complications: Secondary | ICD-10-CM | POA: Insufficient documentation

## 2017-06-09 DIAGNOSIS — Z79899 Other long term (current) drug therapy: Secondary | ICD-10-CM

## 2017-06-09 DIAGNOSIS — R5383 Other fatigue: Secondary | ICD-10-CM | POA: Insufficient documentation

## 2017-06-09 DIAGNOSIS — D472 Monoclonal gammopathy: Secondary | ICD-10-CM | POA: Diagnosis not present

## 2017-06-09 DIAGNOSIS — D509 Iron deficiency anemia, unspecified: Secondary | ICD-10-CM | POA: Insufficient documentation

## 2017-06-09 DIAGNOSIS — K219 Gastro-esophageal reflux disease without esophagitis: Secondary | ICD-10-CM

## 2017-06-09 DIAGNOSIS — I10 Essential (primary) hypertension: Secondary | ICD-10-CM | POA: Insufficient documentation

## 2017-06-09 DIAGNOSIS — Z7982 Long term (current) use of aspirin: Secondary | ICD-10-CM | POA: Diagnosis not present

## 2017-06-09 DIAGNOSIS — D649 Anemia, unspecified: Secondary | ICD-10-CM

## 2017-06-09 DIAGNOSIS — D500 Iron deficiency anemia secondary to blood loss (chronic): Secondary | ICD-10-CM

## 2017-06-09 LAB — FERRITIN: FERRITIN: 50 ng/mL (ref 11–307)

## 2017-06-09 LAB — COMPREHENSIVE METABOLIC PANEL
ALBUMIN: 4 g/dL (ref 3.5–5.0)
ALT: 28 U/L (ref 14–54)
AST: 28 U/L (ref 15–41)
Alkaline Phosphatase: 61 U/L (ref 38–126)
Anion gap: 11 (ref 5–15)
BUN: 12 mg/dL (ref 6–20)
CHLORIDE: 99 mmol/L — AB (ref 101–111)
CO2: 29 mmol/L (ref 22–32)
Calcium: 9.5 mg/dL (ref 8.9–10.3)
Creatinine, Ser: 0.97 mg/dL (ref 0.44–1.00)
GFR calc Af Amer: >60 mL/min (ref >60)
GFR, EST NON AFRICAN AMERICAN: 55 mL/min — AB (ref >60)
Glucose, Bld: 92 mg/dL (ref 65–99)
POTASSIUM: 3.5 mmol/L (ref 3.5–5.1)
Sodium: 139 mmol/L (ref 135–145)
Total Bilirubin: 0.6 mg/dL (ref 0.3–1.2)
Total Protein: 8 g/dL (ref 6.5–8.1)

## 2017-06-09 LAB — CBC WITH DIFFERENTIAL/PLATELET
BASOS ABS: 0 10*3/uL (ref 0.0–0.1)
BASOS PCT: 0 %
EOS ABS: 0.1 10*3/uL (ref 0.0–0.7)
EOS PCT: 2 %
HCT: 42.7 % (ref 36.0–46.0)
Hemoglobin: 13.7 g/dL (ref 12.0–15.0)
Lymphocytes Relative: 43 %
Lymphs Abs: 2.3 10*3/uL (ref 0.7–4.0)
MCH: 28.3 pg (ref 26.0–34.0)
MCHC: 32.1 g/dL (ref 30.0–36.0)
MCV: 88.2 fL (ref 78.0–100.0)
MONO ABS: 0.5 10*3/uL (ref 0.1–1.0)
Monocytes Relative: 9 %
Neutro Abs: 2.4 10*3/uL (ref 1.7–7.7)
Neutrophils Relative %: 46 %
PLATELETS: 216 10*3/uL (ref 150–400)
RBC: 4.84 MIL/uL (ref 3.87–5.11)
RDW: 13.8 % (ref 11.5–15.5)
WBC: 5.3 10*3/uL (ref 4.0–10.5)

## 2017-06-09 LAB — IRON AND TIBC
IRON: 72 ug/dL (ref 28–170)
Saturation Ratios: 20 % (ref 10.4–31.8)
TIBC: 360 ug/dL (ref 250–450)
UIBC: 288 ug/dL

## 2017-06-09 NOTE — Progress Notes (Signed)
Stacey Riley, Stacey Riley [id number]   CLINIC:  Medical Oncology/Hematology  PCP:  [facility] [place] [id number] [phone]   REASON FOR VISIT:  Follow-up for Iron deficiency anemia AND IgG lambda MGUS   CURRENT THERAPY: IV iron prn AND Observation     HISTORY OF PRESENT ILLNESS:  (From Dr. Laverle Patter note on 02/05/17)      INTERVAL HISTORY:  Stacey Riley 77 y.o. female returns for routine follow-up for iron deficiency anemia and MGUS.   Here today unaccompanied.  Overall, she tells me she has been feeling very well.  Appetite 100%; energy levels 75%.  She denies any recent frank bleeding episodes including blood in her stools, dark/tarry stools, hematuria, vaginal bleeding, nosebleeds, or gingival bleeding.  Denies any pica/pagophagia. She denies any new/focal bone pain.  Denies any changes in bowel or bladder.  No new headaches or dizziness.  No fever/chills.  Denies any unintentional weight loss chart reviewed and her weight is largely stable; +/- about 3 pounds.  Otherwise, she is largely without complaints and feels like she is doing well.    REVIEW OF SYSTEMS:  Review of Systems  Constitutional: Positive for fatigue (occasional ). Negative for chills and fever.  HENT:  Negative.  Negative for lump/mass and nosebleeds.   Eyes: Negative.   Respiratory: Negative.  Negative for cough and shortness of breath.   Cardiovascular: Negative.  Negative for chest pain and leg swelling.  Gastrointestinal: Negative.  Negative for abdominal pain, blood in stool, constipation, diarrhea, nausea and vomiting.  Endocrine: Negative.   Genitourinary: Negative.  Negative for dysuria and hematuria.   Musculoskeletal: Negative.  Negative for arthralgias.  Skin: Negative.  Negative for rash.  Neurological: Negative.  Negative for dizziness and headaches.  Hematological: Negative.  Negative for adenopathy. Does not  bruise/bleed easily.  Psychiatric/Behavioral: Negative.  Negative for depression and sleep disturbance. The patient is not nervous/anxious.      PAST MEDICAL/SURGICAL HISTORY:  Past Medical History:  Diagnosis Date  . Anemia 03/14/2016  . Arthritis   . Asthma   . Bradycardia   . DM (diabetes mellitus) ([place])   . GERD (gastroesophageal reflux disease)   . High cholesterol   . HTN (hypertension)    Past Surgical History:  Procedure Laterality Date  . CHOLECYSTECTOMY  1990s  . COLONOSCOPY  03/18/1999   Dr Rourk-int  hemorrhoids, pancolonic diverticula  . COLONOSCOPY N/A 09/26/2016   Procedure: COLONOSCOPY;  Surgeon: Daneil Dolin, MD;  [facility];  Service: Endoscopy;  Laterality: N/A;  1200  . COLONOSCOPY WITH ESOPHAGOGASTRODUODENOSCOPY (EGD)  08/06/2011   Rourk: Schatzki ring, moderate sized hiatal hernia status post dilation for history of dysphagia. Pancolonic diverticula, single diminutive polyp at the base of the cecum removed (tubular adenoma). Next colonoscopy recommended for April 2018  . ESOPHAGOGASTRODUODENOSCOPY  07/02/01   Dr Rourk-Schatzki's ring s/p 4F maloney dilation, otherwise normal/small hiatal hernia/   Linear erosion and ulceration in the proximal stomach/  The ulcerated lesion in the proximal stomach  benign.This may be a [name] lesion secondary to trauma to the mucosa, straddling the  diaphragmatic hiatus in the presence of a hiatal hernia   . ESOPHAGOGASTRODUODENOSCOPY N/A 02/14/2016   Procedure: ESOPHAGOGASTRODUODENOSCOPY (EGD);  Surgeon: Daneil Dolin, MD;  [facility];  Service: Endoscopy;  Laterality: N/A;  Venia Minks DILATION  08/06/2011   Procedure: Venia Minks DILATION;  Surgeon: Daneil Dolin, MD;  [facility];  Service: Endoscopy;  Laterality: N/A;  . POLYPECTOMY  09/26/2016   Procedure: POLYPECTOMY;  Surgeon: Daneil Dolin, MD;  [facility];  Service: Endoscopy;;  descending colon  . TUBAL LIGATION        SOCIAL HISTORY:  Social History   Socioeconomic History  . Marital status: Divorced    Spouse name: Not on file  . Number of children: 5  . Years of education: Not on file  . Highest education level: Not on file  Social Needs  . Financial resource strain: Not on file  . Food insecurity - worry: Not on file  . Food insecurity - inability: Not on file  . Transportation needs - medical: Not on file  . Transportation needs - non-medical: Not on file  Occupational History  . Occupation: retired; Academic librarian  . Smoking status: Never Smoker  . Smokeless tobacco: Never Used  Substance and Sexual Activity  . Alcohol use: No  . Drug use: No  . Sexual activity: Not on file  Other Topics Concern  . Not on file  Social History Narrative   Lives alone    FAMILY HISTORY:  Family History  Problem Relation Age of Onset  . Aneurysm Daughter   . Stroke Daughter   . Diabetes Daughter   . Hypertension Mother   . Dementia Mother   . Hypertension Father   . Kidney cancer Brother   . Cancer Brother        brain  . Cancer Brother        bone  . Stroke Brother   . Hypertension Daughter   . Hypertension Son   . Hyperlipidemia Son   . Colon cancer Neg Hx   . Inflammatory bowel disease Neg Hx     CURRENT MEDICATIONS:  Outpatient Encounter Medications as of 06/09/2017  Medication Sig  . albuterol (PROAIR HFA) 108 (90 BASE) MCG/ACT inhaler Inhale 2 puffs into the lungs every 6 (six) hours as needed for wheezing or shortness of breath.  Marland Kitchen amLODipine (NORVASC) 10 MG tablet Take 10 mg by mouth daily.   Marland Kitchen aspirin 81 MG tablet Take 81 mg by mouth once a week.   . hydrochlorothiazide (HYDRODIURIL) 25 MG tablet Take 25 mg by mouth daily.   Marland Kitchen lisinopril (PRINIVIL,ZESTRIL) 40 MG tablet Take 40 mg by mouth daily.   Marland Kitchen loratadine (CLARITIN) 10 MG tablet Take 1 tablet (10 mg total) by mouth daily. (Patient taking differently: Take 10 mg by mouth daily at 2 PM. )  . pantoprazole  (PROTONIX) 40 MG tablet Take 1 tablet (40 mg total) by mouth 2 (two) times daily before a meal. (Patient taking differently: Take 40 mg by mouth 2 (two) times daily. )  . pravastatin (PRAVACHOL) 20 MG tablet Take 20 mg by mouth at bedtime.   No facility-administered encounter medications on file as of 06/09/2017.     ALLERGIES:  Allergies  Allergen Reactions  . Augmentin [Amoxicillin-Pot Clavulanate] Nausea And Vomiting    Hematemesis Has patient had a PCN reaction causing immediate rash, facial/tongue/throat swelling, SOB or lightheadedness with hypotension: Unknown Has patient had a PCN reaction causing severe rash involving mucus membranes or skin necrosis: Unknown Has patient had a PCN reaction that required hospitalization: Yes Has patient had a PCN reaction occurring within the last 10 years: Yes If all of the above answers are "NO", then may proceed with Cephalosporin use.   . Sulfa Antibiotics Swelling  PHYSICAL EXAM:  ECOG Performance status: 0 - Asymptomatic   Vitals:   06/09/17 1146  BP: (!) 157/55  Pulse: (!) 52  Resp: 16  Temp: 98 F (36.7 C)  SpO2: 99%   Filed Weights   06/09/17 1146  Weight: 170 lb 9.6 oz (77.4 kg)    Physical Exam  Constitutional: She is oriented to person, place, and time and well-developed, well-nourished, and in no distress.  HENT:  Head: Normocephalic.  Mouth/Throat: Oropharynx is clear and moist. No oropharyngeal exudate.  Eyes: Conjunctivae are normal. Pupils are equal, round, and reactive to light. No scleral icterus.  Neck: Normal range of motion. Neck supple.  Cardiovascular: Regular rhythm.  Bradycardic  Pulmonary/Chest: Effort normal and breath sounds normal. No respiratory distress.  Abdominal: Soft. Bowel sounds are normal. There is no tenderness.  Musculoskeletal: Normal range of motion. She exhibits no edema.  Lymphadenopathy:    She has no cervical adenopathy.       Right: No supraclavicular adenopathy present.        Left: No supraclavicular adenopathy present.  Neurological: She is alert and oriented to person, place, and time. No cranial nerve deficit. Gait normal.  Skin: Skin is warm and dry. No rash noted.  Psychiatric: Mood, memory, affect and judgment normal.  Nursing note and vitals reviewed.    LABORATORY DATA:  I have reviewed the labs as listed.  CBC    Component Value Date/Time   WBC 5.3 06/09/2017 1044   RBC 4.84 06/09/2017 1044   HGB 13.7 06/09/2017 1044   HCT 42.7 06/09/2017 1044   PLT 216 06/09/2017 1044   MCV 88.2 06/09/2017 1044   MCH 28.3 06/09/2017 1044   MCHC 32.1 06/09/2017 1044   RDW 13.8 06/09/2017 1044   LYMPHSABS 2.3 06/09/2017 1044   MONOABS 0.5 06/09/2017 1044   EOSABS 0.1 06/09/2017 1044   BASOSABS 0.0 06/09/2017 1044   CMP Latest Ref Rng & Units 06/09/2017 03/10/2017 02/05/2017  Glucose 65 - 99 mg/dL 92 145(H) 136(H)  BUN 6 - 20 mg/dL '12 12 17  '$ Creatinine 0.44 - 1.00 mg/dL 0.97 0.97 0.94  Sodium 135 - 145 mmol/L 139 137 137  Potassium 3.5 - 5.1 mmol/L 3.5 3.7 3.9  Chloride 101 - 111 mmol/L 99(L) 100(L) 102  CO2 22 - 32 mmol/L '29 29 27  '$ Calcium 8.9 - 10.3 mg/dL 9.5 9.7 9.3  Total Protein 6.5 - 8.1 g/dL 8.0 8.2(H) 7.8  Total Bilirubin 0.3 - 1.2 mg/dL 0.6 0.5 0.4  Alkaline Phos 38 - 126 U/L 61 60 60  AST 15 - 41 U/L 28 32 29  ALT 14 - 54 U/L 28 34 31   Results for ALOISE, COPUS (MRN [id number])   Ref. Range 02/05/2017 13:30  Iron Latest Ref Range: 28 - 170 ug/dL 45  UIBC Latest Units: ug/dL 318  TIBC Latest Ref Range: 250 - 450 ug/dL 363  Saturation Ratios Latest Ref Range: 10.4 - 31.8 % 12  Ferritin Latest Ref Range: 11 - 307 ng/mL 35  Results for RALIYAH, MONTELLA (MRN [id number])   Ref. Range 02/05/2017 13:30  Beta-2 Microglobulin Latest Ref Range: 0.6 - 2.4 mg/L 1.8  Total Protein ELP Latest Ref Range: 6.0 - 8.5 g/dL 7.5  Albumin SerPl Elph-Mcnc Latest Ref Range: 2.9 - 4.4 g/dL 3.7  Albumin/Glob SerPl Latest Ref Range: 0.7 - 1.7  1.0  Alpha2  Glob SerPl Elph-Mcnc Latest Ref Range: 0.4 - 1.0 g/dL 0.8  Alpha 1 Latest Ref Range: 0.0 -  0.4 g/dL 0.2  Gamma Glob SerPl Elph-Mcnc Latest Ref Range: 0.4 - 1.8 g/dL 1.7  M Protein SerPl Elph-Mcnc Latest Ref Range: Not Observed g/dL 1.0 (H)  IFE 1 Unknown Comment  Globulin, Total Latest Ref Range: 2.2 - 3.9 g/dL 3.8  Results for YALONDA, SAMPLE (MRN [id number])   Ref. Range 02/05/2017 13:30  IgG (Immunoglobin G), Serum Latest Ref Range: 700 - 1,600 mg/dL 1,672 (H)  IgA Latest Ref Range: 64 - 422 mg/dL 131  IgM (Immunoglobulin M), Srm Latest Ref Range: 26 - 217 mg/dL 70  Kappa free light chain Latest Ref Range: 3.3 - 19.4 mg/L 17.9  Lamda free light chains Latest Ref Range: 5.7 - 26.3 mg/L 23.2  Kappa, lamda light chain ratio Latest Ref Range: 0.26 - 1.65  0.77   PENDING LABS:    DIAGNOSTIC IMAGING:  *The following radiologic images and reports have been reviewed independently and agree with below findings.  Skeletal survey: 09/23/16 CLINICAL DATA:  MGUS  EXAM: METASTATIC BONE SURVEY  COMPARISON:  Chest radiograph, 04/23/2013  FINDINGS: There are no osteolytic lesions. There is no evidence of multiple myeloma. There are no osteoblastic lesions.  There is concentric hip joint space narrowing bilaterally, relatively symmetric, with marginal osteophytes from the superior acetabula and bases of both femoral heads.  Bones are demineralized. There are mild disc degenerative changes along the thoracic spine.  IMPRESSION: 1. No evidence of neoplastic disease to bone. Specifically, no lytic lesions are seen to suggest multiple myeloma.   Electronically Signed   By: Lajean Manes M.D.   On: 09/23/2016 14:08    PATHOLOGY:     ASSESSMENT & PLAN:   Iron deficiency anemia:  -Thought to be secondary to chronic GI blood loss.  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  -Clinically, she feels well. Denies any recent frank  bleeding episodes. Denies pica/pagophagia.  Hgb stable at 13.7 g/dL today.  Iron studies are pending for today. Last IV Iron was given in 05/2016. If she requires additional dose of IV iron, we will contact her to make those arrangements if needed.  Oncology Flowsheet 06/06/2016  ferumoxytol [facility]) IV 510 mg  -Given the stability of her labs, I think we can change her follow-up to every 6 months.  She was given strict instructions to call us if she has any bleeding episodes or with new/worsening symptoms and we could certainly see her sooner, if needed. She agrees with this plan.  -Return to cancer center in 6 months for follow-up with labs.  She has a 40+ minute drive to come to our clinic, so we will obtain labs the same day as her appointment.  She understands that her iron studies and SPEP results will not be available at time of appointment and we will have to call her with those results in the future; she is okay with this plan.     IgG lambda MGUS:  -SPEP/light chains pending for today.  Trend of previous labs reviewed.  In 01/2017, IgG elevated at 1672; M-spike was 1% (previous M-spike 0.9% in 09/2016).  Kappa/lambda light chain ratio was normal at 0.77. Beta-2 microglobulin was normal at 1.8.  -Reviewed "CRAB" symptoms with patient today - calcium normal, kidney function normal, no anemia with normal Hgb, and no new/progressive focal bone pains.   Last skeletal survey on 09/23/16 with no evidence of neoplastic disease to the bone.  -She understands that MGUS puts her at increased risk of  myeloma (~1% risk per year). Clinically, she is doing very well. Will continue to monitor with peripheral blood work for now.  -Return to cancer center in 6 months for follow-up with labs. (labs same day, as patient has a hard time traveling here more often than is scheduled for labs only).       Dispo:  -Return to cancer center in 6 months for follow-up with labs.    All questions were answered to  patient's stated satisfaction. Encouraged patient to call with any new concerns or questions before her next visit to the cancer center and we can certain see her sooner, if needed.      Orders placed this encounter:  Orders Placed This Encounter  Procedures  . CBC with Differential/Platelet  . Basic metabolic panel  . Multiple Myeloma Panel (SPEP&IFE w/QIG)  . Kappa/lambda light chains  . Ferritin  . Iron and TIBC      Mike Craze, NP [place] [phone]

## 2017-06-10 ENCOUNTER — Encounter (HOSPITAL_COMMUNITY): Payer: Self-pay | Admitting: Adult Health

## 2017-06-10 LAB — KAPPA/LAMBDA LIGHT CHAINS
Kappa free light chain: 25.2 mg/L — ABNORMAL HIGH (ref 3.3–19.4)
Kappa, lambda light chain ratio: 1.27 (ref 0.26–1.65)
Lambda free light chains: 19.9 mg/L (ref 5.7–26.3)

## 2017-06-11 LAB — MULTIPLE MYELOMA PANEL, SERUM
ALBUMIN SERPL ELPH-MCNC: 3.9 g/dL (ref 2.9–4.4)
ALBUMIN/GLOB SERPL: 1.1 (ref 0.7–1.7)
ALPHA 1: 0.2 g/dL (ref 0.0–0.4)
Alpha2 Glob SerPl Elph-Mcnc: 0.9 g/dL (ref 0.4–1.0)
B-Globulin SerPl Elph-Mcnc: 0.9 g/dL (ref 0.7–1.3)
GAMMA GLOB SERPL ELPH-MCNC: 1.7 g/dL (ref 0.4–1.8)
GLOBULIN, TOTAL: 3.7 g/dL (ref 2.2–3.9)
IGA: 137 mg/dL (ref 64–422)
IGM (IMMUNOGLOBULIN M), SRM: 73 mg/dL (ref 26–217)
IgG (Immunoglobin G), Serum: 1714 mg/dL — ABNORMAL HIGH (ref 700–1600)
M Protein SerPl Elph-Mcnc: 1 g/dL — ABNORMAL HIGH
Total Protein ELP: 7.6 g/dL (ref 6.0–8.5)

## 2017-09-10 ENCOUNTER — Other Ambulatory Visit (HOSPITAL_COMMUNITY)
Admission: RE | Admit: 2017-09-10 | Discharge: 2017-09-10 | Disposition: A | Discharge status: Home / Self Care | Payer: Medicare HMO | Source: Ambulatory Visit | Attending: Cardiovascular Disease | Admitting: Cardiovascular Disease

## 2017-09-10 ENCOUNTER — Ambulatory Visit: Payer: Medicare HMO | Admitting: Cardiovascular Disease

## 2017-09-10 ENCOUNTER — Encounter: Payer: Self-pay | Admitting: Cardiovascular Disease

## 2017-09-10 VITALS — BP 128/70 | HR 67 | Ht 64.0 in | Wt 173.0 lb

## 2017-09-10 DIAGNOSIS — R001 Bradycardia, unspecified: Secondary | ICD-10-CM | POA: Diagnosis not present

## 2017-09-10 DIAGNOSIS — E7800 Pure hypercholesterolemia, unspecified: Secondary | ICD-10-CM | POA: Diagnosis not present

## 2017-09-10 DIAGNOSIS — I10 Essential (primary) hypertension: Secondary | ICD-10-CM

## 2017-09-10 LAB — TSH: TSH: 1.48 u[IU]/mL (ref 0.350–4.500)

## 2017-09-10 NOTE — Patient Instructions (Addendum)
Your physician wants you to follow-up in: 6 months with Dr.Koneswaran You will receive a reminder letter in the mail two months in advance. If you don't receive a letter, please call our office to schedule the follow-up appointment.   .Your physician recommends that you continue on your current medications as directed. Please refer to the Current Medication list given to you today.   If you need a refill on your cardiac medications before your next appointment, please call your pharmacy.    Please get lab work:TSH     No tests ordered today      Thank you for choosing [facility] !

## 2017-09-10 NOTE — Addendum Note (Signed)
Addended by: Barbarann Ehlers A on: 09/10/2017 09:23 AM   Modules accepted: Orders

## 2017-09-10 NOTE — Progress Notes (Signed)
CARDIOLOGY CONSULT NOTE  Patient ID: Stacey Riley MRN: [id number] DOB/AGE: 09-24-1940 77 y.o.  Admit date: (Not on file) Primary Physician: [facility] Referring Physician: Dr. Abran Richard  Reason for Consultation: Bradycardia  HPI: Stacey Riley is a 77 y.o. female who is being seen today for the evaluation of bradycardia at the request of Dr. Abran Richard.  Past medical history includes hypertension, hyperlipidemia, iron deficiency anemia, and monoclonal gammopathy of undetermined significance.  She was evaluated by her PCP on 08/11/2017, heart rate was reportedly 44 bpm.  Blood pressure was 120/63.  It appears she had been followed by a cardiologist, Dr. Jacques Earthly, with [facility] in [place].  She was last evaluated by him for asymptomatic sinus bradycardia on 08/28/2016.  The patient denies chest pain, exertional dyspnea, leg swelling, palpitations, lightheadedness, dizziness, and syncope.  She said she feels fatigued on occasion.  For instance, yesterday she felt tired but today she said she feels good.  She said when she gets up and does her routine activities of daily living her energy levels improved.  I personally reviewed an ECG performed on 03/10/2017 which showed sinus rhythm, 76 bpm.  I independently reviewed an additional ECG from 02/14/2016 which showed sinus rhythm, 69 bpm.  She has no known history of thyroid disease.    Allergies  Allergen Reactions  . Augmentin [Amoxicillin-Pot Clavulanate] Nausea And Vomiting    Hematemesis Has patient had a PCN reaction causing immediate rash, facial/tongue/throat swelling, SOB or lightheadedness with hypotension: Unknown Has patient had a PCN reaction causing severe rash involving mucus membranes or skin necrosis: Unknown Has patient had a PCN reaction that required hospitalization: Yes Has patient had a PCN reaction occurring within the last 10 years: Yes If all of  the above answers are "NO", then may proceed with Cephalosporin use.   . Sulfa Antibiotics Swelling    Current Outpatient Medications  Medication Sig Dispense Refill  . albuterol (PROAIR HFA) 108 (90 BASE) MCG/ACT inhaler Inhale 2 puffs into the lungs every 6 (six) hours as needed for wheezing or shortness of breath.    Marland Kitchen amLODipine (NORVASC) 5 MG tablet Take 5 mg by mouth daily.    Marland Kitchen aspirin 81 MG tablet Take 81 mg by mouth once a week.     . hydrochlorothiazide (HYDRODIURIL) 25 MG tablet Take 25 mg by mouth daily.     Marland Kitchen lisinopril (PRINIVIL,ZESTRIL) 40 MG tablet Take 40 mg by mouth daily.     Marland Kitchen loratadine (CLARITIN) 10 MG tablet Take 1 tablet (10 mg total) by mouth daily. (Patient taking differently: Take 10 mg by mouth daily at 2 PM. ) 30 tablet 0  . pantoprazole (PROTONIX) 40 MG tablet Take 40 mg by mouth daily.    . pravastatin (PRAVACHOL) 20 MG tablet Take 20 mg by mouth at bedtime.     No current facility-administered medications for this visit.     Past Medical History:  Diagnosis Date  . Anemia 03/14/2016  . Arthritis   . Asthma   . Bradycardia   . DM (diabetes mellitus) ([place])   . GERD (gastroesophageal reflux disease)   . High cholesterol   . HTN (hypertension)     Past Surgical History:  Procedure Laterality Date  . CHOLECYSTECTOMY  1990s  . COLONOSCOPY  03/18/1999   Dr Rourk-int  hemorrhoids, pancolonic diverticula  . COLONOSCOPY N/A 09/26/2016   Procedure: COLONOSCOPY;  Surgeon: Daneil Dolin, MD;  [facility];  Service: Endoscopy;  Laterality: N/A;  1200  . COLONOSCOPY WITH ESOPHAGOGASTRODUODENOSCOPY (EGD)  08/06/2011   Rourk: Schatzki ring, moderate sized hiatal hernia status post dilation for history of dysphagia. Pancolonic diverticula, single diminutive polyp at the base of the cecum removed (tubular adenoma). Next colonoscopy recommended for April 2018  . ESOPHAGOGASTRODUODENOSCOPY  07/02/01   Dr Rourk-Schatzki's ring s/p 28F maloney dilation,  otherwise normal/small hiatal hernia/   Linear erosion and ulceration in the proximal stomach/  The ulcerated lesion in the proximal stomach  benign.This may be a [name] lesion secondary to trauma to the mucosa, straddling the  diaphragmatic hiatus in the presence of a hiatal hernia   . ESOPHAGOGASTRODUODENOSCOPY N/A 02/14/2016   Procedure: ESOPHAGOGASTRODUODENOSCOPY (EGD);  Surgeon: Daneil Dolin, MD;  [facility];  Service: Endoscopy;  Laterality: N/A;  Venia Minks DILATION  08/06/2011   Procedure: Venia Minks DILATION;  Surgeon: Daneil Dolin, MD;  [facility];  Service: Endoscopy;  Laterality: N/A;  . POLYPECTOMY  09/26/2016   Procedure: POLYPECTOMY;  Surgeon: Daneil Dolin, MD;  [facility];  Service: Endoscopy;;  descending colon  . TUBAL LIGATION      Social History   Socioeconomic History  . Marital status: Divorced    Spouse name: Not on file  . Number of children: 5  . Years of education: Not on file  . Highest education level: Not on file  Occupational History  . Occupation: retired; Copy  . Financial resource strain: Not on file  . Food insecurity:    Worry: Not on file    Inability: Not on file  . Transportation needs:    Medical: Not on file    Non-medical: Not on file  Tobacco Use  . Smoking status: Never Smoker  . Smokeless tobacco: Never Used  Substance and Sexual Activity  . Alcohol use: No  . Drug use: No  . Sexual activity: Not on file  Lifestyle  . Physical activity:    Days per week: Not on file    Minutes per session: Not on file  . Stress: Not on file  Relationships  . Social connections:    Talks on phone: Not on file    Gets together: Not on file    Attends religious service: Not on file    Active member of club or organization: Not on file    Attends meetings of clubs or organizations: Not on file    Relationship status: Not on file  . Intimate partner violence:    Fear of current or ex  partner: Not on file    Emotionally abused: Not on file    Physically abused: Not on file    Forced sexual activity: Not on file  Other Topics Concern  . Not on file  Social History Narrative   Lives alone     No family history of premature CAD in 1st degree relatives.  Current Meds  Medication Sig  . albuterol (PROAIR HFA) 108 (90 BASE) MCG/ACT inhaler Inhale 2 puffs into the lungs every 6 (six) hours as needed for wheezing or shortness of breath.  Marland Kitchen amLODipine (NORVASC) 5 MG tablet Take 5 mg by mouth daily.  Marland Kitchen aspirin 81 MG tablet Take 81 mg by mouth once a week.   . hydrochlorothiazide (HYDRODIURIL) 25 MG tablet Take 25 mg by mouth daily.   Marland Kitchen lisinopril (PRINIVIL,ZESTRIL) 40 MG tablet Take 40 mg by mouth daily.   Marland Kitchen  loratadine (CLARITIN) 10 MG tablet Take 1 tablet (10 mg total) by mouth daily. (Patient taking differently: Take 10 mg by mouth daily at 2 PM. )  . pantoprazole (PROTONIX) 40 MG tablet Take 40 mg by mouth daily.  . pravastatin (PRAVACHOL) 20 MG tablet Take 20 mg by mouth at bedtime.      Review of systems complete and found to be negative unless listed above in HPI    Physical exam Blood pressure 128/70, pulse 67, height 5\' 4"  (1.626 m), weight 173 lb (78.5 kg), SpO2 96 %. General: NAD Neck: No JVD, no thyromegaly or thyroid nodule.  Lungs: Clear to auscultation bilaterally with normal respiratory effort. CV: Nondisplaced PMI. Regular rate and rhythm, normal S1/S2, no S3/S4, no murmur.  No peripheral edema.  No carotid bruit.   Abdomen: Soft, nontender, no distention.  Skin: Intact without lesions or rashes.  Neurologic: Alert and oriented x 3.  Psych: Normal affect. Extremities: No clubbing or cyanosis.  HEENT: Normal.   ECG: Most recent ECG reviewed.   Labs: Lab Results  Component Value Date/Time   K 3.5 06/09/2017 10:44 AM   BUN 12 06/09/2017 10:44 AM   CREATININE 0.97 06/09/2017 10:44 AM   ALT 28 06/09/2017 10:44 AM   HGB 13.7 06/09/2017 10:44 AM      Lipids: No results found for: LDLCALC, LDLDIRECT, CHOL, TRIG, HDL      ASSESSMENT AND PLAN:  1.  Bradycardia: She appears to have asymptomatic sinus bradycardia.  While she has occasional fatigue, this quickly improves with activity.  I will try to obtain a copy of her labs and specifically her TSH from her PCP.  At this time, I do not feel event monitoring is necessary.  2.  Hypertension: Blood pressure is controlled on present therapy which includes amlodipine 5 mg, hydrochlorothiazide 25 mill grams, and lisinopril 40 mg.  No changes.  3.  Hyperlipidemia: Currently on pravastatin 20 mg.    Disposition: Follow up in 6 months   Signed: Kate Sable, M.D., F.A.C.C.  09/10/2017, 9:05 AM

## 2017-10-27 ENCOUNTER — Encounter (HOSPITAL_COMMUNITY): Payer: Self-pay | Admitting: *Deleted

## 2017-10-27 ENCOUNTER — Emergency Department (HOSPITAL_COMMUNITY): Payer: Medicare HMO

## 2017-10-27 ENCOUNTER — Emergency Department (HOSPITAL_COMMUNITY)
Admission: EM | Admit: 2017-10-27 | Discharge: 2017-10-27 | Disposition: A | Discharge status: Home / Self Care | Payer: Medicare HMO | Attending: Emergency Medicine | Admitting: Emergency Medicine

## 2017-10-27 ENCOUNTER — Other Ambulatory Visit: Payer: Self-pay

## 2017-10-27 DIAGNOSIS — R079 Chest pain, unspecified: Secondary | ICD-10-CM

## 2017-10-27 DIAGNOSIS — Z7982 Long term (current) use of aspirin: Secondary | ICD-10-CM | POA: Insufficient documentation

## 2017-10-27 DIAGNOSIS — I10 Essential (primary) hypertension: Secondary | ICD-10-CM | POA: Insufficient documentation

## 2017-10-27 DIAGNOSIS — R001 Bradycardia, unspecified: Secondary | ICD-10-CM

## 2017-10-27 DIAGNOSIS — J45909 Unspecified asthma, uncomplicated: Secondary | ICD-10-CM | POA: Insufficient documentation

## 2017-10-27 DIAGNOSIS — E119 Type 2 diabetes mellitus without complications: Secondary | ICD-10-CM | POA: Insufficient documentation

## 2017-10-27 DIAGNOSIS — Z79899 Other long term (current) drug therapy: Secondary | ICD-10-CM | POA: Insufficient documentation

## 2017-10-27 LAB — CBC
HEMATOCRIT: 42.5 % (ref 36.0–46.0)
HEMOGLOBIN: 13.8 g/dL (ref 12.0–15.0)
MCH: 28.4 pg (ref 26.0–34.0)
MCHC: 32.5 g/dL (ref 30.0–36.0)
MCV: 87.4 fL (ref 78.0–100.0)
Platelets: 210 10*3/uL (ref 150–400)
RBC: 4.86 MIL/uL (ref 3.87–5.11)
RDW: 13.8 % (ref 11.5–15.5)
WBC: 5.5 10*3/uL (ref 4.0–10.5)

## 2017-10-27 LAB — URINALYSIS, ROUTINE W REFLEX MICROSCOPIC
Bilirubin Urine: NEGATIVE
Glucose, UA: NEGATIVE mg/dL
HGB URINE DIPSTICK: NEGATIVE
Ketones, ur: NEGATIVE mg/dL
Leukocytes, UA: NEGATIVE
Nitrite: NEGATIVE
PROTEIN: NEGATIVE mg/dL
SPECIFIC GRAVITY, URINE: 1.009 (ref 1.005–1.030)
pH: 8 (ref 5.0–8.0)

## 2017-10-27 LAB — BASIC METABOLIC PANEL
ANION GAP: 10 (ref 5–15)
BUN: 14 mg/dL (ref 8–23)
CALCIUM: 9.5 mg/dL (ref 8.9–10.3)
CO2: 27 mmol/L (ref 22–32)
Chloride: 102 mmol/L (ref 98–111)
Creatinine, Ser: 1 mg/dL (ref 0.44–1.00)
GFR, EST NON AFRICAN AMERICAN: 53 mL/min — AB (ref >60)
Glucose, Bld: 121 mg/dL — ABNORMAL HIGH (ref 70–99)
POTASSIUM: 3.5 mmol/L (ref 3.5–5.1)
SODIUM: 139 mmol/L (ref 135–145)

## 2017-10-27 LAB — TROPONIN I

## 2017-10-27 MED ORDER — ACETAMINOPHEN 500 MG PO TABS
1000.0000 mg | ORAL_TABLET | Freq: Once | ORAL | Status: AC
  Administered 2017-10-27: 1000 mg via ORAL
  Filled 2017-10-27: qty 2

## 2017-10-27 MED ORDER — GI COCKTAIL ~~LOC~~
30.0000 mL | Freq: Once | ORAL | Status: AC
  Administered 2017-10-27: 30 mL via ORAL
  Filled 2017-10-27: qty 30

## 2017-10-27 MED ORDER — NITROGLYCERIN 0.4 MG SL SUBL: 0.4000 mg | SUBLINGUAL_TABLET | SUBLINGUAL | 0 refills | Status: DC | PRN

## 2017-10-27 MED ORDER — PANTOPRAZOLE SODIUM 40 MG PO TBEC: 40.0000 mg | DELAYED_RELEASE_TABLET | Freq: Every day | ORAL | 0 refills | Status: DC

## 2017-10-27 NOTE — ED Triage Notes (Addendum)
Pt c/o mid chest pain that started last night. Pt reports she had the pain last week as well but it went away. Pt also reports she has pain in her head, bilateral shoulders and groin areas. Denies SOB, weakness, n/v. Pt was seen by PCP this morning and after EKG was done pt was sent to ED for further evaluation.

## 2017-10-27 NOTE — ED Notes (Signed)
EDP at bedside updating patient and family. 

## 2017-10-27 NOTE — ED Notes (Signed)
EDP at bedside  

## 2017-10-27 NOTE — ED Notes (Signed)
Patient transported to X-ray 

## 2017-10-27 NOTE — Discharge Instructions (Addendum)
Follow-up appointment on Thursday, August 8 at 2:30 PM next door in the cardiology office.  Tests showed no life-threatening condition. Prescription for stomach medicine.  If symptoms worsen suggest follow-up at [facility] where our cardiology division is located.  Tylenol or ibuprofen for headache.  Urine sample showed no evidence for infection.

## 2017-10-27 NOTE — ED Provider Notes (Addendum)
[facility] EMERGENCY DEPARTMENT Provider Note   CSN: [id number] Arrival date & time: 10/27/17  1027     History   Chief Complaint Chief Complaint  Patient presents with  . Chest Pain    HPI Stacey Riley is a 77 y.o. female.  Patient presents with burning sensation in her central chest with radiation to both shoulders today.  Similar symptoms 1 week ago.  No dyspnea, diaphoresis, nausea.  No known history of CAD.  Past medical history includes diet-controlled diabetes, hypertension, hypercholesterolemia.  Severity of symptoms is mild to moderate.  Symptoms not related to exertion.     Past Medical History:  Diagnosis Date  . Anemia 03/14/2016  . Arthritis   . Asthma   . Bradycardia   . DM (diabetes mellitus) ([place])   . GERD (gastroesophageal reflux disease)   . High cholesterol   . HTN (hypertension)     Patient Active Problem List   Diagnosis Date Noted  . Large hiatal hernia 03/10/2017  . 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 History of colonic polyps 08/19/2016  . Anemia 03/14/2016  . UGIB (upper gastrointestinal bleed) 02/14/2016  . Diabetes ([place]) 02/14/2016  . Essential hypertension 02/14/2016  . Hyperlipidemia 02/14/2016  . Acute bronchitis 02/14/2016  . GERD (gastroesophageal reflux disease) 07/14/2011  . Screen for colon cancer 07/14/2011  . Dysphagia 07/14/2011    Past Surgical History:  Procedure Laterality Date  . CHOLECYSTECTOMY  1990s  . COLONOSCOPY  03/18/1999   Dr Rourk-int  hemorrhoids, pancolonic diverticula  . COLONOSCOPY N/A 09/26/2016   Procedure: COLONOSCOPY;  Surgeon: Daneil Dolin, MD;  Location: [facility] ENDO SUITE;  Service: Endoscopy;  Laterality: N/A;  1200  . COLONOSCOPY WITH ESOPHAGOGASTRODUODENOSCOPY (EGD)  08/06/2011   Rourk: Schatzki ring, moderate sized hiatal hernia status post dilation for history of dysphagia. Pancolonic diverticula, single diminutive polyp at the base of the cecum removed (tubular  adenoma). Next colonoscopy recommended for April 2018  . ESOPHAGOGASTRODUODENOSCOPY  07/02/01   Dr Rourk-Schatzki's ring s/p 57F maloney dilation, otherwise normal/small hiatal hernia/   Linear erosion and ulceration in the proximal stomach/  The ulcerated lesion in the proximal stomach  benign.This may be a [name] lesion secondary to trauma to the mucosa, straddling the  diaphragmatic hiatus in the presence of a hiatal hernia   . ESOPHAGOGASTRODUODENOSCOPY N/A 02/14/2016   Procedure: ESOPHAGOGASTRODUODENOSCOPY (EGD);  Surgeon: Daneil Dolin, MD;  Location: [facility] ENDO SUITE;  Service: Endoscopy;  Laterality: N/A;  Venia Minks DILATION  08/06/2011   Procedure: Venia Minks DILATION;  Surgeon: Daneil Dolin, MD;  Location: [facility] ENDO SUITE;  Service: Endoscopy;  Laterality: N/A;  . POLYPECTOMY  09/26/2016   Procedure: POLYPECTOMY;  Surgeon: Daneil Dolin, MD;  Location: [facility] ENDO SUITE;  Service: Endoscopy;;  descending colon  . TUBAL LIGATION       OB History   None      Home Medications    Prior to Admission medications   Medication Sig Start Date End Date Taking? Authorizing Provider  acetaminophen (TYLENOL) 500 MG tablet Take 500 mg by mouth daily as needed for headache.   Yes [provider]  albuterol (PROAIR HFA) 108 (90 BASE) MCG/ACT inhaler Inhale 2 puffs into the lungs every 6 (six) hours as needed for wheezing or shortness of breath.   Yes [provider]  amLODipine (NORVASC) 5 MG tablet Take 5 mg by mouth daily.   Yes [provider]  Ascorbic Acid (VITAMIN C) 100 MG  tablet Take 100 mg by mouth daily.   Yes [provider]  aspirin 81 MG tablet Take 81 mg by mouth once a week.    Yes [provider]  hydrochlorothiazide (HYDRODIURIL) 25 MG tablet Take 25 mg by mouth daily.  04/22/11  Yes [provider]  lisinopril (PRINIVIL,ZESTRIL) 40 MG tablet Take 40 mg by mouth daily.  04/22/11  Yes [provider]  loratadine (CLARITIN) 10 MG  tablet Take 1 tablet (10 mg total) by mouth daily. 02/16/16  Yes Thurnell Lose, MD  pantoprazole (PROTONIX) 40 MG tablet Take 40 mg by mouth daily.   Yes [provider]  pravastatin (PRAVACHOL) 20 MG tablet Take 20 mg by mouth at bedtime.   Yes [provider]  pantoprazole (PROTONIX) 40 MG tablet Take 1 tablet (40 mg total) by mouth daily. 10/27/17   [name], MD    Family History Family History  Problem Relation Age of Onset  . Aneurysm Daughter   . Stroke Daughter   . Diabetes Daughter   . Hypertension Mother   . Dementia Mother   . Hypertension Father   . Kidney cancer Brother   . Cancer Brother        brain  . Cancer Brother        bone  . Stroke Brother   . Hypertension Daughter   . Hypertension Son   . Hyperlipidemia Son   . Colon cancer Neg Hx   . Inflammatory bowel disease Neg Hx     Social History Social History   Tobacco Use  . Smoking status: Never Smoker  . Smokeless tobacco: Never Used  Substance Use Topics  . Alcohol use: No  . Drug use: No     Allergies   Augmentin [amoxicillin-pot clavulanate] and Sulfa antibiotics   Review of Systems Review of Systems  All other systems reviewed and are negative.    Physical Exam Updated Vital Signs BP (!) 160/81 (BP Location: Right Arm)   Pulse (!) 44   Temp 98.6 F (37 C) (Oral)   Resp 19   Ht 5\' 4"  (1.626 m)   Wt 79.8 kg (176 lb)   SpO2 99%   BMI 30.21 kg/m   Physical Exam  Constitutional: She is oriented to person, place, and time. She appears well-developed and well-nourished.  nad  HENT:  Head: Normocephalic and atraumatic.  Eyes: Conjunctivae are normal.  Neck: Neck supple.  Cardiovascular: Normal rate and regular rhythm.  Pulmonary/Chest: Effort normal and breath sounds normal.  Abdominal: Soft. Bowel sounds are normal.  Musculoskeletal: Normal range of motion.  Neurological: She is alert and oriented to person, place, and time.  Skin: Skin is warm and dry.    Psychiatric: She has a normal mood and affect. Her behavior is normal.  Nursing note and vitals reviewed.    ED Treatments / Results  Labs (all labs ordered are listed, but only abnormal results are displayed) Labs Reviewed  BASIC METABOLIC PANEL - Abnormal; Notable for the following components:      Result Value   Glucose, Bld 121 (*)    GFR calc non Af Amer 53 (*)    All other components within normal limits  URINALYSIS, ROUTINE W REFLEX MICROSCOPIC - Abnormal; Notable for the following components:   Color, Urine STRAW (*)    All other components within normal limits  CBC  TROPONIN I    EKG EKG Interpretation  Date/Time:  Tuesday October 27 2017 10:54:19 EDT Ventricular  Rate:  48 PR Interval:    QRS Duration: 106 QT Interval:  439 QTC Calculation: 393 R Axis:   6 Text Interpretation:  Sinus bradycardia Probable LVH with secondary repol abnrm Confirmed by [name] [phone]) on 10/27/2017 11:18:18 AM   Radiology Dg Chest 2 View  Result Date: 10/27/2017 CLINICAL DATA:  Mid chest pain and burning since last night, similar symptoms last week EXAM: CHEST - 2 VIEW COMPARISON:  Chest x-ray of 03/10/2017 FINDINGS: No active infiltrate or effusion is seen. Mediastinal and hilar contours are unremarkable. The heart is mildly enlarged and stable. A moderate size hiatal hernia is again noted and does not appear to have changed. There is a mild curvature of the thoracic spine convex to the right. IMPRESSION: 1. No active lung disease. 2. Moderate size hiatal hernia. 3. No change in mild to moderate thoracolumbar scoliosis. Electronically Signed   By: Ivar Drape M.D.   On: 10/27/2017 11:29    Procedures Procedures (including critical care time)  Medications Ordered in ED Medications  acetaminophen (TYLENOL) tablet 1,000 mg (has no administration in time range)  gi cocktail (Maalox,Lidocaine,Donnatal) (30 mLs Oral Given 10/27/17 1150)     Initial Impression / Assessment and Plan /  ED Course  I have reviewed the triage vital signs and the nursing notes.  Pertinent labs & imaging results that were available during my care of the patient were reviewed by me and considered in my medical decision making (see chart for details).     Patient presents with burning sensation in her chest.  She does have cardiac risk factors.  EKG shows sinus bradycardia.  Troponin negative.  Chest x-ray/other labs show no acute anomaly.  GI cocktail seemed to help.  Discussed the possibility of cardiac related pain.  Discharge medication Protonix 40 mg and nitroglycerin.  Patient will return if symptoms worsen.  Final Clinical Impressions(s) / ED Diagnoses   Final diagnoses:  Chest pain, unspecified type  Bradycardia    ED Discharge Orders        Ordered    pantoprazole (PROTONIX) 40 MG tablet  Daily     10/27/17 1448       [name], MD 10/27/17 1516    [name], MD 10/27/17 1527

## 2017-10-31 ENCOUNTER — Encounter (HOSPITAL_COMMUNITY): Payer: Self-pay | Admitting: Emergency Medicine

## 2017-10-31 ENCOUNTER — Emergency Department (HOSPITAL_COMMUNITY): Payer: Medicare HMO

## 2017-10-31 ENCOUNTER — Emergency Department (HOSPITAL_COMMUNITY)
Admission: EM | Admit: 2017-10-31 | Discharge: 2017-10-31 | Disposition: A | Discharge status: Home / Self Care | Payer: Medicare HMO | Attending: Emergency Medicine | Admitting: Emergency Medicine

## 2017-10-31 DIAGNOSIS — R001 Bradycardia, unspecified: Secondary | ICD-10-CM | POA: Diagnosis not present

## 2017-10-31 DIAGNOSIS — R0789 Other chest pain: Secondary | ICD-10-CM | POA: Insufficient documentation

## 2017-10-31 DIAGNOSIS — R079 Chest pain, unspecified: Secondary | ICD-10-CM | POA: Diagnosis present

## 2017-10-31 LAB — HEPATIC FUNCTION PANEL
ALK PHOS: 49 U/L (ref 38–126)
ALT: 28 U/L (ref 0–44)
AST: 30 U/L (ref 15–41)
Albumin: 4.1 g/dL (ref 3.5–5.0)
BILIRUBIN DIRECT: 0.1 mg/dL (ref 0.0–0.2)
Indirect Bilirubin: 0.5 mg/dL (ref 0.3–0.9)
Total Bilirubin: 0.6 mg/dL (ref 0.3–1.2)
Total Protein: 7.6 g/dL (ref 6.5–8.1)

## 2017-10-31 LAB — BASIC METABOLIC PANEL
Anion gap: 10 (ref 5–15)
BUN: 17 mg/dL (ref 8–23)
CALCIUM: 9.1 mg/dL (ref 8.9–10.3)
CO2: 29 mmol/L (ref 22–32)
Chloride: 100 mmol/L (ref 98–111)
Creatinine, Ser: 1.22 mg/dL — ABNORMAL HIGH (ref 0.44–1.00)
GFR calc non Af Amer: 42 mL/min — ABNORMAL LOW (ref >60)
GFR, EST AFRICAN AMERICAN: 48 mL/min — AB (ref >60)
GLUCOSE: 139 mg/dL — AB (ref 70–99)
POTASSIUM: 3.6 mmol/L (ref 3.5–5.1)
SODIUM: 139 mmol/L (ref 135–145)

## 2017-10-31 LAB — CBC
HCT: 44 % (ref 36.0–46.0)
Hemoglobin: 13.7 g/dL (ref 12.0–15.0)
MCH: 27.5 pg (ref 26.0–34.0)
MCHC: 31.1 g/dL (ref 30.0–36.0)
MCV: 88.4 fL (ref 78.0–100.0)
Platelets: 203 10*3/uL (ref 150–400)
RBC: 4.98 MIL/uL (ref 3.87–5.11)
RDW: 13.7 % (ref 11.5–15.5)
WBC: 5.4 10*3/uL (ref 4.0–10.5)

## 2017-10-31 LAB — I-STAT TROPONIN, ED
Troponin i, poc: 0 ng/mL (ref 0.00–0.08)
Troponin i, poc: 0 ng/mL (ref 0.00–0.08)

## 2017-10-31 LAB — LIPASE, BLOOD: Lipase: 28 U/L (ref 11–51)

## 2017-10-31 MED ORDER — GI COCKTAIL ~~LOC~~
30.0000 mL | Freq: Once | ORAL | Status: AC
  Administered 2017-10-31: 30 mL via ORAL
  Filled 2017-10-31: qty 30

## 2017-10-31 MED ORDER — SUCRALFATE 1 G PO TABS: 1.0000 g | ORAL_TABLET | Freq: Three times a day (TID) | ORAL | 0 refills | Status: DC

## 2017-10-31 NOTE — ED Triage Notes (Signed)
Pt reports intermittent chest pain since she was last seen at [facility], reports now pain is constant. Reports some SHOB, no nausea.

## 2017-10-31 NOTE — ED Notes (Signed)
Pt stable, ambulatory, and verbalizes understanding of d/c instructions.  

## 2017-10-31 NOTE — ED Provider Notes (Signed)
[place] EMERGENCY DEPARTMENT Provider Note   CSN: [id number] Arrival date & time: 10/31/17  0228     History   Chief Complaint Chief Complaint  Patient presents with  . Chest Pain    HPI Stacey Riley is a 77 y.o. female.  Patient with history of bradycardia, diabetes, hypertension and acid reflux disease presenting with 2 weeks of intermittent central chest pain.  She describes pain in the center of her chest that is a burning sensation that radiates to both shoulders as well as her left arm.  The pain lasts for hours at a time sometimes all day.  She is had this pain on and off for about 2 weeks.  She was seen for similar pain on July 16 at any pain hospital and told it could be acid reflux related.  She comes in today because she is had pain ongoing all day yesterday with some shortness of breath.  No nausea, vomiting, diaphoresis.  No cough or fever.  He denies any known CAD history.  Her last stress test was many years ago.  The pain is not exertional or pleuritic.   Chest Pain   Associated symptoms include shortness of breath. Pertinent negatives include no abdominal pain, no dizziness, no fever, no headaches, no nausea, no numbness, no vomiting and no weakness.  Pertinent negatives for past medical history include no seizures.    Past Medical History:  Diagnosis Date  . Anemia 03/14/2016  . Arthritis   . Asthma   . Bradycardia   . DM (diabetes mellitus) ([place])   . GERD (gastroesophageal reflux disease)   . High cholesterol   . HTN (hypertension)     Patient Active Problem List   Diagnosis Date Noted  . Large hiatal hernia 03/10/2017  . 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 History of colonic polyps 08/19/2016  . Anemia 03/14/2016  . UGIB (upper gastrointestinal bleed) 02/14/2016  . Diabetes ([place]) 02/14/2016  . Essential hypertension 02/14/2016  . Hyperlipidemia 02/14/2016  . Acute bronchitis 02/14/2016  . GERD  (gastroesophageal reflux disease) 07/14/2011  . Screen for colon cancer 07/14/2011  . Dysphagia 07/14/2011    Past Surgical History:  Procedure Laterality Date  . CHOLECYSTECTOMY  1990s  . COLONOSCOPY  03/18/1999   Dr Rourk-int  hemorrhoids, pancolonic diverticula  . COLONOSCOPY N/A 09/26/2016   Procedure: COLONOSCOPY;  Surgeon: Daneil Dolin, MD;  Location: [facility];  Service: Endoscopy;  Laterality: N/A;  1200  . COLONOSCOPY WITH ESOPHAGOGASTRODUODENOSCOPY (EGD)  08/06/2011   Rourk: Schatzki ring, moderate sized hiatal hernia status post dilation for history of dysphagia. Pancolonic diverticula, single diminutive polyp at the base of the cecum removed (tubular adenoma). Next colonoscopy recommended for April 2018  . ESOPHAGOGASTRODUODENOSCOPY  07/02/01   Dr Rourk-Schatzki's ring s/p 3F maloney dilation, otherwise normal/small hiatal hernia/   Linear erosion and ulceration in the proximal stomach/  The ulcerated lesion in the proximal stomach  benign.This may be a [name] lesion secondary to trauma to the mucosa, straddling the  diaphragmatic hiatus in the presence of a hiatal hernia   . ESOPHAGOGASTRODUODENOSCOPY N/A 02/14/2016   Procedure: ESOPHAGOGASTRODUODENOSCOPY (EGD);  Surgeon: Daneil Dolin, MD;  Location: [facility];  Service: Endoscopy;  Laterality: N/A;  Venia Minks DILATION  08/06/2011   Procedure: Venia Minks DILATION;  Surgeon: Daneil Dolin, MD;  Location: [facility] ENDO [facility];  Service: Endoscopy;  Laterality: N/A;  . POLYPECTOMY  09/26/2016   Procedure: POLYPECTOMY;  Surgeon:  Rourk, Cristopher Estimable, MD;  Location: [facility] ENDO [facility];  Service: Endoscopy;;  descending colon  . TUBAL LIGATION       OB History   None      Home Medications    Prior to Admission medications   Medication Sig Start Date End Date Taking? Authorizing Provider  acetaminophen (TYLENOL) 500 MG tablet Take 500 mg by mouth daily as needed for headache.    [provider]  albuterol (PROAIR HFA) 108 (90  BASE) MCG/ACT inhaler Inhale 2 puffs into the lungs every 6 (six) hours as needed for wheezing or shortness of breath.    [provider]  amLODipine (NORVASC) 5 MG tablet Take 5 mg by mouth daily.    [provider]  Ascorbic Acid (VITAMIN C) 100 MG tablet Take 100 mg by mouth daily.    [provider]  aspirin 81 MG tablet Take 81 mg by mouth once a week.     [provider]  hydrochlorothiazide (HYDRODIURIL) 25 MG tablet Take 25 mg by mouth daily.  04/22/11   [provider]  lisinopril (PRINIVIL,ZESTRIL) 40 MG tablet Take 40 mg by mouth daily.  04/22/11   [provider]  loratadine (CLARITIN) 10 MG tablet Take 1 tablet (10 mg total) by mouth daily. 02/16/16   Thurnell Lose, MD  nitroGLYCERIN (NITROSTAT) 0.4 MG SL tablet Place 1 tablet (0.4 mg total) under the tongue every 5 (five) minutes as needed for chest pain. 10/27/17   [name], MD  pantoprazole (PROTONIX) 40 MG tablet Take 40 mg by mouth daily.    [provider]  pantoprazole (PROTONIX) 40 MG tablet Take 1 tablet (40 mg total) by mouth daily. 10/27/17   [name], MD  pravastatin (PRAVACHOL) 20 MG tablet Take 20 mg by mouth at bedtime.    [provider]    Family History Family History  Problem Relation Age of Onset  . Aneurysm Daughter   . Stroke Daughter   . Diabetes Daughter   . Hypertension Mother   . Dementia Mother   . Hypertension Father   . Kidney cancer Brother   . Cancer Brother        brain  . Cancer Brother        bone  . Stroke Brother   . Hypertension Daughter   . Hypertension Son   . Hyperlipidemia Son   . Colon cancer Neg Hx   . Inflammatory bowel disease Neg Hx     Social History Social History   Tobacco Use  . Smoking status: Never Smoker  . Smokeless tobacco: Never Used  Substance Use Topics  . Alcohol use: No  . Drug use: No     Allergies   Augmentin [amoxicillin-pot clavulanate] and Sulfa antibiotics   Review  of Systems Review of Systems  Constitutional: Negative for activity change, appetite change and fever.  HENT: Negative for congestion and rhinorrhea.   Eyes: Negative for visual disturbance.  Respiratory: Positive for chest tightness and shortness of breath.   Cardiovascular: Positive for chest pain.  Gastrointestinal: Negative for abdominal pain, nausea and vomiting.  Genitourinary: Negative for dysuria, hematuria, vaginal bleeding and vaginal discharge.  Musculoskeletal: Negative for arthralgias and myalgias.  Skin: Negative for rash.  Neurological: Negative for dizziness, seizures, weakness, numbness and headaches.   all other systems are negative except as noted in the HPI and PMH.     Physical Exam Updated Vital Signs BP (!) 170/60 (BP Location: Right Arm)  Pulse (!) 45   Temp 98.5 F (36.9 C) (Oral)   Resp 18   Ht 5\' 4"  (1.626 m)   Wt 79.8 kg (176 lb)   SpO2 100%   BMI 30.21 kg/m   Physical Exam  Constitutional: She is oriented to person, place, and time. She appears well-developed and well-nourished. No distress.  HENT:  Head: Normocephalic and atraumatic.  Mouth/Throat: Oropharynx is clear and moist. No oropharyngeal exudate.  Eyes: Pupils are equal, round, and reactive to light. Conjunctivae and EOM are normal.  Neck: Normal range of motion. Neck supple.  No meningismus.  Cardiovascular: Normal rate, regular rhythm, normal heart sounds and intact distal pulses.  No murmur heard. Pulmonary/Chest: Effort normal and breath sounds normal. No respiratory distress.  Abdominal: Soft. There is no tenderness. There is no rebound and no guarding.  Musculoskeletal: Normal range of motion. She exhibits no edema or tenderness.  Neurological: She is alert and oriented to person, place, and time. No cranial nerve deficit. She exhibits normal muscle tone. Coordination normal.  No ataxia on finger to nose bilaterally. No pronator drift. 5/5 strength throughout. CN 2-12  intact.Equal grip strength. Sensation intact.   Skin: Skin is warm.  Psychiatric: She has a normal mood and affect. Her behavior is normal.  Nursing note and vitals reviewed.    ED Treatments / Results  Labs (all labs ordered are listed, but only abnormal results are displayed) Labs Reviewed  BASIC METABOLIC PANEL - Abnormal; Notable for the following components:      Result Value   Glucose, Bld 139 (*)    Creatinine, Ser 1.22 (*)    GFR calc non Af Amer 42 (*)    GFR calc Af Amer 48 (*)    All other components within normal limits  CBC  HEPATIC FUNCTION PANEL  LIPASE, BLOOD  I-STAT TROPONIN, ED  I-STAT TROPONIN, ED    EKG EKG Interpretation  Date/Time:  Saturday October 31 2017 02:36:15 EDT Ventricular Rate:  48 PR Interval:  148 QRS Duration: 102 QT Interval:  442 QTC Calculation: 394 R Axis:   3 Text Interpretation:  Sinus bradycardia Left ventricular hypertrophy with repolarization abnormality Abnormal ECG No significant change was found Confirmed by Ezequiel Essex [phone]) on 10/31/2017 4:45:49 AM   Radiology Dg Chest 2 View  Result Date: 10/31/2017 CLINICAL DATA:  Intermittent chest pain EXAM: CHEST - 2 VIEW COMPARISON:  10/27/2017 FINDINGS: The heart size and mediastinal contours are within normal limits. Aortic atherosclerosis at the arch without aneurysmal dilatation. Moderate-sized hiatal hernia superimposed upon the cardiac silhouette. Stable dextroconvex curvature of the lower thoracic spine. Both lungs are clear. The visualized skeletal structures are unremarkable. IMPRESSION: 1. No active cardiopulmonary disease. 2. Moderate-sized hiatal hernia is redemonstrated. 3. Aortic atherosclerosis without aneurysm. 4. 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 Electronically Signed   By: Ashley Royalty M.D.   On: 10/31/2017 02:53    Procedures Procedures (including critical care time)  Medications Ordered in ED Medications  gi cocktail  (Maalox,Lidocaine,Donnatal) (30 mLs Oral Given 10/31/17 0525)     Initial Impression / Assessment and Plan / ED Course  I have reviewed the triage vital signs and the nursing notes.  Pertinent labs & imaging results that were available during my care of the patient were reviewed by me and considered in my medical decision making (see chart for details).    2 weeks of central chest burning that radiates to her bilateral shoulders.  Pain lasts for several hours at  a time.  EKG shows sinus bradycardia without acute ST changes.   Per [facility] cardiology records, patient had 2- heart catheterizations in the 1990s.  Her last stress test was in 2011.  Patient's chest pain has resolved after GI cocktail in the ED.  Troponin is negative.  EKG is sinus bradycardia similar to previous.  She denies any dizziness or lightheadedness. Cardiac chest pain seems less likely with  negative troponin in setting of hours long pain.  Continue Protonix and will add Carafate.  Patient continues to have bradycardia appears to have 2-1 block on her EKG.  She denies any dizziness or lightheadedness.  She does not take any beta-blockers.  She does take amlodipine which she was recommended to stop.  EKG discussed with Dr. Claiborne Billings of cardiology.  He feels this is more sinus bradycardia with possible U waves.  As patient is not symptomatic,, he feels that she can go home. Will recommend she stop her amlodipine for the time being.  She does not take any beta-blocker eyedrops.  Her TSH test has been normal.  Her chest pain is atypical for ACS.  Troponin negative x2. Suspect more GI or esophageal in origin.  Troponins negative in the setting of pain ongoing all day.  Low suspicion for PE or aortic dissection.  Patient comfortable with discharge home. return precautions discussed.   Final Clinical Impressions(s) / ED Diagnoses   Final diagnoses:  Atypical chest pain  Bradycardia    ED Discharge Orders    None         Virgie Kunda, Annie Main, MD 10/31/17 [phone]

## 2017-10-31 NOTE — Discharge Instructions (Signed)
Your chest pain is not typical for cardiac type pain.  This is likely coming from your stomach or esophagus.  Continue your Protonix and add Carafate.  Call Dr. Bronson Ing on Monday and let him know about your recent ED visits as well as your slow heart rate.  Stop your amlodipine because this could be slowing down your heart.  Return to the ED if you develop dizziness, vomiting, chest pain that is worse when he exerts herself or go upstairs, sweating, shortness of breath or any other concerns.

## 2017-11-09 ENCOUNTER — Encounter: Payer: Self-pay | Admitting: Gastroenterology

## 2017-11-09 ENCOUNTER — Ambulatory Visit: Payer: Medicare HMO | Admitting: Gastroenterology

## 2017-11-09 VITALS — BP 146/54 | HR 53 | Temp 97.1°F | Ht 64.0 in | Wt 172.2 lb

## 2017-11-09 DIAGNOSIS — K449 Diaphragmatic hernia without obstruction or gangrene: Secondary | ICD-10-CM | POA: Diagnosis not present

## 2017-11-09 DIAGNOSIS — R1013 Epigastric pain: Secondary | ICD-10-CM | POA: Diagnosis not present

## 2017-11-09 DIAGNOSIS — R079 Chest pain, unspecified: Secondary | ICD-10-CM

## 2017-11-09 DIAGNOSIS — R0789 Other chest pain: Secondary | ICD-10-CM | POA: Insufficient documentation

## 2017-11-09 MED ORDER — SUCRALFATE 1 G PO TABS: 1.0000 g | ORAL_TABLET | Freq: Three times a day (TID) | ORAL | 0 refills | Status: DC

## 2017-11-09 NOTE — Progress Notes (Signed)
Primary Care Physician: [facility]  Primary Gastroenterologist:  [name], MD   Chief Complaint  Patient presents with  . Gastroesophageal Reflux    has been to ER x 2    HPI: Stacey Riley is a 77 y.o. female here for further evaluation of chest pain, GERD.  Patient was last seen in January 2019.  Patient has a history of chronic anemia requiring IV iron infusions, followed by hematology.  History of [name] lesions and large hiatal hernia noted on prior EGD in November 2017 when she presented with upper GI bleeding.  When she was seen back in January she was doing very well on pantoprazole 40 mg twice daily.  Patient tells me at some point her PCP switched her to once daily dosing.  She cannot remember when.  She has been to the emergency department twice this month with chest pain.  Initially at [facility] and in the second time she went to [facility].  EKG showed sinus bradycardia, troponins negative.  GI cocktail seemed to help.  It was felt that her chest pain was likely GI in origin.  Patient describes the pain is starting in the epigastrium would radiate up into the chest and between the shoulder blades as well is down into the abdomen somewhat.  Pain would take her breath away but really no shortness of breath or diaphoresis.  She notes that Carafate has seemed to help, this was provided from 1 of the ED visits.  She denies dysphagia.  Bowel function has been fine.  No melena or rectal bleeding.   Current Outpatient Medications  Medication Sig Dispense Refill  . acetaminophen (TYLENOL) 500 MG tablet Take 500 mg by mouth daily as needed for headache.    . albuterol (PROAIR HFA) 108 (90 BASE) MCG/ACT inhaler Inhale 2 puffs into the lungs every 6 (six) hours as needed for wheezing or shortness of breath.    . Ascorbic Acid (VITAMIN C) 100 MG tablet Take 100 mg by mouth daily.    Marland Kitchen aspirin 81 MG tablet Take 81 mg by mouth once a week.      . hydrochlorothiazide (HYDRODIURIL) 25 MG tablet Take 25 mg by mouth daily.     Marland Kitchen lisinopril (PRINIVIL,ZESTRIL) 40 MG tablet Take 40 mg by mouth daily.     Marland Kitchen loratadine (CLARITIN) 10 MG tablet Take 1 tablet (10 mg total) by mouth daily. (Patient taking differently: Take 10 mg by mouth daily as needed for allergies. ) 30 tablet 0  . pantoprazole (PROTONIX) 40 MG tablet Take 1 tablet (40 mg total) by mouth daily. 10 tablet 0  . pravastatin (PRAVACHOL) 20 MG tablet Take 20 mg by mouth at bedtime.    . sucralfate (CARAFATE) 1 g tablet Take 1 tablet (1 g total) by mouth 4 (four) times daily -  with meals and at bedtime. 30 tablet 0  . nitroGLYCERIN (NITROSTAT) 0.4 MG SL tablet Place 1 tablet (0.4 mg total) under the tongue every 5 (five) minutes as needed for chest pain. (Patient not taking: Reported on 11/09/2017) 30 tablet 0   No current facility-administered medications for this visit.     Allergies as of 11/09/2017 - Review Complete 11/09/2017  Allergen Reaction Noted  . Augmentin [amoxicillin-pot clavulanate] Nausea And Vomiting 02/14/2016  . Sulfa antibiotics Swelling 07/14/2011    ROS:  General: Negative for anorexia, weight loss, fever, chills, fatigue, weakness. ENT: Negative for hoarseness, difficulty swallowing ,  nasal congestion. CV: Negative for angina, palpitations, dyspnea on exertion, peripheral edema. See hpi Respiratory: Negative for dyspnea at rest, dyspnea on exertion, cough, sputum, wheezing.  GI: See history of present illness. GU:  Negative for dysuria, hematuria, urinary incontinence, urinary frequency, nocturnal urination.  Endo: Negative for unusual weight change.    Physical Examination:   BP (!) 146/54   Pulse (!) 53   Temp (!) 97.1 F (36.2 C) (Oral)   Ht 5\' 4"  (1.626 m)   Wt 172 lb 3.2 oz (78.1 kg)   BMI 29.56 kg/m   General: Well-nourished, well-developed in no acute distress.  Eyes: No icterus. Mouth: Oropharyngeal mucosa moist and pink , no lesions  erythema or exudate. Lungs: Clear to auscultation bilaterally.  Heart: Regular rate and rhythm, no murmurs rubs or gallops.  Abdomen: Bowel sounds are normal, nontender, nondistended, no hepatosplenomegaly or masses, no abdominal bruits or hernia , no rebound or guarding.   Extremities: No lower extremity edema. No clubbing or deformities. Neuro: Alert and oriented x 4   Skin: Warm and dry, no jaundice.   Psych: Alert and cooperative, normal mood and affect.  Labs:  Lab Results  Component Value Date   CREATININE 1.22 (H) 10/31/2017   BUN 17 10/31/2017   NA 139 10/31/2017   K 3.6 10/31/2017   CL 100 10/31/2017   CO2 29 10/31/2017   Lab Results  Component Value Date   WBC 5.4 10/31/2017   HGB 13.7 10/31/2017   HCT 44.0 10/31/2017   MCV 88.4 10/31/2017   PLT 203 10/31/2017   Lab Results  Component Value Date   ALT 28 10/31/2017   AST 30 10/31/2017   ALKPHOS 49 10/31/2017   BILITOT 0.6 10/31/2017   Lab Results  Component Value Date   LIPASE 28 10/31/2017     Imaging Studies: Dg Chest 2 View  Result Date: 10/31/2017 CLINICAL DATA:  Intermittent chest pain EXAM: CHEST - 2 VIEW COMPARISON:  10/27/2017 FINDINGS: The heart size and mediastinal contours are within normal limits. Aortic atherosclerosis at the arch without aneurysmal dilatation. Moderate-sized hiatal hernia superimposed upon the cardiac silhouette. Stable dextroconvex curvature of the lower thoracic spine. Both lungs are clear. The visualized skeletal structures are unremarkable. IMPRESSION: 1. No active cardiopulmonary disease. 2. Moderate-sized hiatal hernia is redemonstrated. 3. Aortic atherosclerosis without aneurysm. 4. 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 Electronically Signed   By: Ashley Royalty M.D.   On: 10/31/2017 02:53   Dg Chest 2 View  Result Date: 10/27/2017 CLINICAL DATA:  Mid chest pain and burning since last night, similar symptoms last week EXAM: CHEST - 2 VIEW COMPARISON:  Chest x-ray  of 03/10/2017 FINDINGS: No active infiltrate or effusion is seen. Mediastinal and hilar contours are unremarkable. The heart is mildly enlarged and stable. A moderate size hiatal hernia is again noted and does not appear to have changed. There is a mild curvature of the thoracic spine convex to the right. IMPRESSION: 1. No active lung disease. 2. Moderate size hiatal hernia. 3. No change in mild to moderate thoracolumbar scoliosis. Electronically Signed   By: Ivar Drape M.D.   On: 10/27/2017 11:29

## 2017-11-09 NOTE — Patient Instructions (Signed)
1. Increase pantoprazole to 40 mg for breakfast and 40 mg before evening meal at least for the next 4 weeks.  We may continue longer if needed. 2. Carafate 1 tablet 4 times daily for the next 10 days.  Prescription sent to pharmacy. 3. X-ray of your hernia as scheduled.  We will contact you with results as available. 4. Return to the office in 8 weeks or sooner if needed.

## 2017-11-10 ENCOUNTER — Encounter: Payer: Self-pay | Admitting: Gastroenterology

## 2017-11-10 NOTE — Progress Notes (Signed)
cc'ed to pcp °

## 2017-11-10 NOTE — Assessment & Plan Note (Signed)
77 year old female with history of large hiatal hernia presenting with recent onset epigastric pain radiating up into the chest, and between the shoulder blades.  She is been to the emergency department twice for chest pain and it has been felt to be GI in origin based on limited work-up.  Some response to GI cocktail as well as Carafate.  Interestingly she had been doing very well when she was on pantoprazole 40 mg twice daily but it was cut back by her PCP at some point in January.  She may be having symptoms related to her large hiatal hernia, possible Stacey Riley lesions or worsening reflux in the setting of change of medication and large hiatal hernia.  Would increase her pantoprazole to 40 mg twice daily.  We will continue her Carafate for 10 more days.  Evaluate her hernia via upper GI series.  Currently she is not having any further chest pain and she does have follow-up with cardiology next week.

## 2017-11-18 ENCOUNTER — Ambulatory Visit (HOSPITAL_COMMUNITY)
Admission: RE | Admit: 2017-11-18 | Discharge: 2017-11-18 | Disposition: A | Discharge status: Home / Self Care | Payer: Medicare HMO | Source: Ambulatory Visit | Attending: Gastroenterology | Admitting: Gastroenterology

## 2017-11-18 DIAGNOSIS — K219 Gastro-esophageal reflux disease without esophagitis: Secondary | ICD-10-CM | POA: Diagnosis not present

## 2017-11-18 DIAGNOSIS — K224 Dyskinesia of esophagus: Secondary | ICD-10-CM | POA: Insufficient documentation

## 2017-11-18 DIAGNOSIS — R1013 Epigastric pain: Secondary | ICD-10-CM | POA: Insufficient documentation

## 2017-11-18 DIAGNOSIS — R079 Chest pain, unspecified: Secondary | ICD-10-CM | POA: Insufficient documentation

## 2017-11-18 DIAGNOSIS — K449 Diaphragmatic hernia without obstruction or gangrene: Secondary | ICD-10-CM | POA: Diagnosis not present

## 2017-11-19 ENCOUNTER — Encounter

## 2017-11-19 ENCOUNTER — Encounter: Payer: Self-pay | Admitting: Student

## 2017-11-19 ENCOUNTER — Ambulatory Visit: Payer: Medicare HMO | Admitting: Student

## 2017-11-19 ENCOUNTER — Encounter: Payer: Self-pay | Admitting: *Deleted

## 2017-11-19 VITALS — BP 172/84 | HR 56 | Ht 64.0 in | Wt 176.0 lb

## 2017-11-19 DIAGNOSIS — R079 Chest pain, unspecified: Secondary | ICD-10-CM | POA: Diagnosis not present

## 2017-11-19 DIAGNOSIS — E7800 Pure hypercholesterolemia, unspecified: Secondary | ICD-10-CM | POA: Diagnosis not present

## 2017-11-19 DIAGNOSIS — R001 Bradycardia, unspecified: Secondary | ICD-10-CM

## 2017-11-19 DIAGNOSIS — I10 Essential (primary) hypertension: Secondary | ICD-10-CM | POA: Diagnosis not present

## 2017-11-19 MED ORDER — NITROGLYCERIN 0.4 MG SL SUBL: 0.4000 mg | SUBLINGUAL_TABLET | SUBLINGUAL | 3 refills | Status: DC | PRN

## 2017-11-19 MED ORDER — AMLODIPINE BESYLATE 5 MG PO TABS: 5.0000 mg | ORAL_TABLET | Freq: Every day | ORAL | 3 refills | Status: DC

## 2017-11-19 NOTE — Patient Instructions (Signed)
Medication Instructions:  Your physician recommends that you continue on your current medications as directed. Please refer to the Current Medication list given to you today.  Norvasc 5 mg Daily   Labwork: NONE   Testing/Procedures: Your physician has requested that you have en exercise stress myoview. For further information please visit [email or web address]. Please follow instruction sheet, as given.   Follow-Up: Your physician wants you to follow-up in: 6 Months. You will receive a reminder letter in the mail two months in advance. If you don't receive a letter, please call our office to schedule the follow-up appointment.   Any Other Special Instructions Will Be Listed Below (If Applicable).     If you need a refill on your cardiac medications before your next appointment, please call your pharmacy.  Thank you for choosing [facility]!

## 2017-11-19 NOTE — Progress Notes (Signed)
Cardiology Office Note    Date:  11/19/2017   ID:  Stacey Riley, DOB 12/12/1940, MRN [id number]  PCP:  [facility]  Cardiologist: Kate Sable, MD    Chief Complaint  Patient presents with  . Follow-up    recent Emergency Dept visit    History of Present Illness:    Stacey Riley is a 77 y.o. female with past medical history of HTN, HLD, GERD, and iron deficiency anemia who presents to the office today for follow-up from a recent Emergency Department visit.  She was last examined by Dr. Bronson Ing in 08/2017 as a new patient referral for bradycardia. Had been evaluated by her PCP and was noted to have a heart rate in the 40's. She reported occasional fatigue but denied any associated chest discomfort, dyspnea on exertion, lightheadedness, or presyncope. A repeat EKG was obtained at the time of her visit and showed heart rate was in the 70's. Further monitoring was not felt to be necessary at that time and she was continued on her current medication regimen.  She was recently evaluated at [facility] ED on 10/27/2017 for chest discomfort which started the previous night. A GI cocktail was administered with improvement in her symptoms. Initial troponin was negative and EKG showed no acute ischemic changes. She was started on Protonix 40 mg daily at the time of discharge. Presented back to the ED on 10/31/2017 for recurrent chest pain which had lasted for the entire day. Denied associated nausea, vomiting, or diaphoresis. Pain was not worse with exertion or positional changes. Initial and delta troponin values were negative and outpatient Cardiology follow-up was arranged. She has followed up with GI in the outpatient setting and it was thought her symptoms might be related to her large hiatal hernia. Protonix was titrated to 40 mg twice daily on 11/10/2017.  In talking with the patient today, she reports having increasing episodes of chest discomfort which can  occur at rest or with activity. Symptoms typically last for several hours and spontaneously resolve. Not always associated with food consumption. Protonix was titrated recently as outlined above and she feels like this has helped somewhat. Was recently diagnosed with a large hiatal hernia and was informed that she might require surgery for this if she is in agreement. She denies any associated dyspnea, orthopnea, PND, or lower extremity edema.   Past Medical History:  Diagnosis Date  . Anemia 03/14/2016  . Arthritis   . Asthma   . Bradycardia   . DM (diabetes mellitus) ([place])   . GERD (gastroesophageal reflux disease)   . High cholesterol   . HTN (hypertension)     Past Surgical History:  Procedure Laterality Date  . CHOLECYSTECTOMY  1990s  . COLONOSCOPY  03/18/1999   Dr Rourk-int  hemorrhoids, pancolonic diverticula  . COLONOSCOPY N/A 09/26/2016   Dr. Gala [place]: Diverticulosis, 4 mm polyp removed from the descending colon which is a tubular adenoma.  No future colonoscopies unless new symptoms arise.  Marland Kitchen COLONOSCOPY WITH ESOPHAGOGASTRODUODENOSCOPY (EGD)  08/06/2011   Rourk: Schatzki ring, moderate sized hiatal hernia status post dilation for history of dysphagia. Pancolonic diverticula, single diminutive polyp at the base of the cecum removed (tubular adenoma). Next colonoscopy recommended for April 2018  . ESOPHAGOGASTRODUODENOSCOPY  07/02/01   Dr Rourk-Schatzki's ring s/p 57F maloney dilation, otherwise normal/small hiatal hernia/   Linear erosion and ulceration in the proximal stomach/  The ulcerated lesion in the proximal stomach  benign.This may be a  [name] lesion secondary to trauma to the mucosa, straddling the  diaphragmatic hiatus in the presence of a hiatal hernia   . ESOPHAGOGASTRODUODENOSCOPY N/A 02/14/2016   Dr. Gala [place]: Large hiatal hernia, [name] lesion likely explains bleeding.  Marland Kitchen MALONEY DILATION  08/06/2011   Procedure: Venia Minks DILATION;  Surgeon: Daneil Dolin, MD;  Location: [facility]  ENDO SUITE;  Service: Endoscopy;  Laterality: N/A;  . POLYPECTOMY  09/26/2016   Procedure: POLYPECTOMY;  Surgeon: Daneil Dolin, MD;  Location: [facility] ENDO SUITE;  Service: Endoscopy;;  descending colon  . TUBAL LIGATION      Current Medications: Outpatient Medications Prior to Visit  Medication Sig Dispense Refill  . acetaminophen (TYLENOL) 500 MG tablet Take 500 mg by mouth daily as needed for headache.    . albuterol (PROAIR HFA) 108 (90 BASE) MCG/ACT inhaler Inhale 2 puffs into the lungs every 6 (six) hours as needed for wheezing or shortness of breath.    . Ascorbic Acid (VITAMIN C) 100 MG tablet Take 100 mg by mouth daily.    Marland Kitchen aspirin 81 MG tablet Take 81 mg by mouth once a week.     . hydrochlorothiazide (HYDRODIURIL) 25 MG tablet Take 25 mg by mouth daily.     Marland Kitchen lisinopril (PRINIVIL,ZESTRIL) 40 MG tablet Take 40 mg by mouth daily.     Marland Kitchen loratadine (CLARITIN) 10 MG tablet Take 1 tablet (10 mg total) by mouth daily. (Patient taking differently: Take 10 mg by mouth daily as needed for allergies. ) 30 tablet 0  . pantoprazole (PROTONIX) 40 MG tablet Take 1 tablet (40 mg total) by mouth daily. 10 tablet 0  . pravastatin (PRAVACHOL) 20 MG tablet Take 20 mg by mouth at bedtime.    . sucralfate (CARAFATE) 1 g tablet Take 1 tablet (1 g total) by mouth 4 (four) times daily -  with meals and at bedtime. FOR 10 DAYS 40 tablet 0  . nitroGLYCERIN (NITROSTAT) 0.4 MG SL tablet Place 1 tablet (0.4 mg total) under the tongue every 5 (five) minutes as needed for chest pain. 30 tablet 0   No facility-administered medications prior to visit.      Allergies:   Augmentin [amoxicillin-pot clavulanate] and Sulfa antibiotics   Social History   Socioeconomic History  . Marital status: Divorced    Spouse name: Not on file  . Number of children: 5  . Years of education: Not on file  . Highest education level: Not on file  Occupational History  . Occupation: retired; Copy  . Financial  resource strain: Not on file  . Food insecurity:    Worry: Not on file    Inability: Not on file  . Transportation needs:    Medical: Not on file    Non-medical: Not on file  Tobacco Use  . Smoking status: Never Smoker  . Smokeless tobacco: Never Used  Substance and Sexual Activity  . Alcohol use: No  . Drug use: No  . Sexual activity: Not on file  Lifestyle  . Physical activity:    Days per week: Not on file    Minutes per session: Not on file  . Stress: Not on file  Relationships  . Social connections:    Talks on phone: Not on file    Gets together: Not on file    Attends religious service: Not on file    Active member of club or organization: Not on file    Attends meetings of clubs or organizations: Not  on file    Relationship status: Not on file  Other Topics Concern  . Not on file  Social History Narrative   Lives alone     Family History:  The patient's family history includes Aneurysm in her daughter; Cancer in her brother and brother; Dementia in her mother; Diabetes in her daughter; Hyperlipidemia in her son; Hypertension in her daughter, father, mother, and son; Kidney cancer in her brother; Stroke in her brother and daughter.   Review of Systems:   Please see the history of present illness.     General:  No chills, fever, night sweats or weight changes.  Cardiovascular:  No dyspnea on exertion, edema, orthopnea, palpitations, paroxysmal nocturnal dyspnea. Positive for chest pain.  Dermatological: No rash, lesions/masses Respiratory: No cough, dyspnea Urologic: No hematuria, dysuria Abdominal:   No nausea, vomiting, diarrhea, bright red blood per rectum, melena, or hematemesis Neurologic:  No visual changes, wkns, changes in mental status. All other systems reviewed and are otherwise negative except as noted above.   Physical Exam:    VS:  BP (!) 172/84   Pulse (!) 56   Ht 5\' 4"  (1.626 m)   Wt 176 lb (79.8 kg)   SpO2 96%   BMI 30.21 kg/m      General: Well developed, well nourished,female appearing in no acute distress. Head: Normocephalic, atraumatic, sclera non-icteric, no xanthomas, nares are without discharge.  Neck: No carotid bruits. JVD not elevated.  Lungs: Respirations regular and unlabored, without wheezes or rales.  Heart: Regular rhythm, bradycardiac rate. No S3 or S4.  No murmur, no rubs, or gallops appreciated. Abdomen: Soft, non-tender, non-distended with normoactive bowel sounds. No hepatomegaly. No rebound/guarding. No obvious abdominal masses. Msk:  Strength and tone appear normal for age. No joint deformities or effusions. Extremities: No clubbing or cyanosis. No lower extremity edema.  Distal pedal pulses are 2+ bilaterally. Neuro: Alert and oriented X 3. Moves all extremities spontaneously. No focal deficits noted. Psych:  Responds to questions appropriately with a normal affect. Skin: No rashes or lesions noted  Wt Readings from Last 3 Encounters:  11/19/17 176 lb (79.8 kg)  11/09/17 172 lb 3.2 oz (78.1 kg)  10/31/17 176 lb (79.8 kg)     Studies/Labs Reviewed:   EKG:  EKG is not ordered today.  EKG from 10/31/2017 is reviewed which shows sinus bradycardia, heart rate 48, with borderline LVH.  Recent Labs: 09/10/2017: TSH 1.480 10/31/2017: ALT 28; BUN 17; Creatinine, Ser 1.22; Hemoglobin 13.7; Platelets 203; Potassium 3.6; Sodium 139   Lipid Panel No results found for: CHOL, TRIG, HDL, CHOLHDL, VLDL, LDLCALC, LDLDIRECT  Additional studies/ records that were reviewed today include:   CXR: 10/31/2017 FINDINGS: The heart size and mediastinal contours are within normal limits. Aortic atherosclerosis at the arch without aneurysmal dilatation. Moderate-sized hiatal hernia superimposed upon the cardiac silhouette. Stable dextroconvex curvature of the lower thoracic spine. Both lungs are clear. The visualized skeletal structures are unremarkable.  IMPRESSION: 1. No active cardiopulmonary disease. 2.  Moderate-sized hiatal hernia is redemonstrated. 3. Aortic atherosclerosis without aneurysm. 4. 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  Assessment:    1. Chest pain, unspecified type   2. Bradycardia   3. Essential hypertension   4. Hypercholesterolemia      Plan:   In order of problems listed above:  1. Chest Pain - Patient has been evaluated in the ED multiple times over the past few months for chest discomfort. EKG tracings showed no acute ischemic changes and  troponin values remained negative. She has been followed by GI in the outpatient setting and Protonix has been further titrated to 40 mg twice daily with improvement in her symptoms. - She is still having episodes of chest discomfort which can occur at rest or with activity but can be constant for up to 2 to 3 hours at a time. Denies any associated dyspnea, nausea, vomiting, or diaphoresis. Reports the pain does radiate into her shoulders. Was recently diagnosed with a hiatal hernia by GI and might be undergoing surgery in the near future. - Given her mixed chest pain symptoms and likely need for upcoming cardiac clearance in regards to surgery, will plan for a Myoview stress test. Will order this as a Treadmill Myoview to assess for chronotropic incompetence given her resting bradycardia. She will likely not be able to reach target heart rate walking on the treadmill and we can switch to a [employer].  2. Bradycardia - Heart rate is in the mid 50's during today's visit. She denies any associated lightheadedness, dizziness, or presyncope. She is not on any AV nodal blocking agents. Will plan for a portion of her stress test to be performed on the treadmill to assess for chronotropic incompetence.  3. HTN - BP initially reported as being elevated to 200/76 during today's visit, improved to 172/84 on recheck. She is currently on Hydrochlorothiazide 25 mg daily and Lisinopril 40 mg daily. She reports Amlodpine was  recently discontinued at her last ER visit. Will plan to restart this at 5 mg daily. She has follow-up with her PCP on Monday and might require further titration pending BP trend.   4. HLD - Followed by PCP. She remains on Pravastatin 20 mg daily.   Medication Adjustments/Labs and Tests Ordered: Current medicines are reviewed at length with the patient today.  Concerns regarding medicines are outlined above.  Medication changes, Labs and Tests ordered today are listed in the Patient Instructions below. Patient Instructions  Medication Instructions:  Your physician recommends that you continue on your current medications as directed. Please refer to the Current Medication list given to you today.  Norvasc 5 mg Daily   Labwork: NONE   Testing/Procedures: Your physician has requested that you have en exercise stress myoview. For further information please visit [email or web address]. Please follow instruction sheet, as given.   Follow-Up: Your physician wants you to follow-up in: 6 Months. You will receive a reminder letter in the mail two months in advance. If you don't receive a letter, please call our office to schedule the follow-up appointment.  Any Other Special Instructions Will Be Listed Below (If Applicable).  If you need a refill on your cardiac medications before your next appointment, please call your pharmacy.  Thank you for choosing [facility]!    Signed, Erma Heritage, PA-C  11/19/2017 9:52 PM    [facility] [address] [place], [place] [address] Phone: [phone]

## 2017-11-20 NOTE — Progress Notes (Signed)
Large hiatal hernia known from EGD prior. She has greater than 50% of stomach in chest. Diffuse dysmotility noted. I hope she is doing better on BID PPI. NO acute findings on this exam. Needs to monitor for vomiting, severe chest pain, inability to tolerate diet/liquids. Would need emergent medical evaluation. Likely not a surgical candidate. She needs to follow strict GERD precautions, sit upright while eating, do not lay down at least 3 hours after eating, continue aggressive GERD treatment. Further rec's per Magda Paganini when she returns.

## 2017-11-23 NOTE — Progress Notes (Signed)
Pt is aware and said the PPI bid is helping a lot. She is aware to seek help if vomiting, chest pain and inability to tolerate liquids.  Forwarding to Neil Crouch, PA for any recommendations.

## 2017-11-27 ENCOUNTER — Encounter (HOSPITAL_COMMUNITY)
Admission: RE | Admit: 2017-11-27 | Discharge: 2017-11-27 | Disposition: A | Discharge status: Home / Self Care | Payer: Medicare HMO | Source: Ambulatory Visit | Attending: Student | Admitting: Student

## 2017-11-27 ENCOUNTER — Ambulatory Visit (HOSPITAL_COMMUNITY)
Admission: RE | Admit: 2017-11-27 | Discharge: 2017-11-27 | Disposition: A | Discharge status: Home / Self Care | Payer: Medicare HMO | Source: Ambulatory Visit | Attending: Student | Admitting: Student

## 2017-11-27 DIAGNOSIS — R079 Chest pain, unspecified: Secondary | ICD-10-CM | POA: Insufficient documentation

## 2017-11-27 LAB — NM MYOCAR MULTI W/SPECT W/WALL MOTION / EF
CHL CUP RESTING HR STRESS: 47 {beats}/min
CHL RATE OF PERCEIVED EXERTION: 15
CSEPEDS: 0 s
Estimated workload: 3.1 METS
Exercise duration (min): 2 min
LVDIAVOL: 62 mL (ref 46–106)
LVSYSVOL: 27 mL
MPHR: 142 {beats}/min
NUC STRESS TID: 0.91
Peak HR: 130 {beats}/min
Percent HR: 91 %
RATE: 0.42
SDS: 8
SRS: 2
SSS: 10

## 2017-11-27 MED ORDER — REGADENOSON 0.4 MG/5ML IV SOLN
INTRAVENOUS | Status: AC
  Filled 2017-11-27: qty 5

## 2017-11-27 MED ORDER — TECHNETIUM TC 99M TETROFOSMIN IV KIT
10.0000 | PACK | Freq: Once | INTRAVENOUS | Status: AC | PRN
Start: 2017-11-27 — End: 2017-11-27
  Administered 2017-11-27: 11 via INTRAVENOUS

## 2017-11-27 MED ORDER — SODIUM CHLORIDE 0.9% FLUSH
INTRAVENOUS | Status: AC
  Administered 2017-11-27: 10 mL via INTRAVENOUS
  Filled 2017-11-27: qty 10

## 2017-11-27 MED ORDER — TECHNETIUM TC 99M TETROFOSMIN IV KIT
30.0000 | PACK | Freq: Once | INTRAVENOUS | Status: AC | PRN
  Administered 2017-11-27: 30 via INTRAVENOUS

## 2017-12-01 ENCOUNTER — Telehealth: Payer: Self-pay | Admitting: *Deleted

## 2017-12-01 NOTE — Telephone Encounter (Signed)
-----   Message from Erma Heritage, [place] sent at 11/28/2017  7:54 AM EDT ----- Please let the patient know that her stress test showed overall normal blood flow to the heart with no significant ischemia.  Pumping function of the heart was within a normal range. Blood pressure was elevated during testing which we discussed at the time of this. She should continue to follow blood pressure in the ambulatory setting as we recently restarted Amlodipine at her last office visit. Please forward a copy of results to [facility]. Thank you.

## 2017-12-01 NOTE — Telephone Encounter (Signed)
Called patient with test results. No answer. Left message to call back.  

## 2017-12-02 NOTE — Telephone Encounter (Signed)
RETURNING CALL -- CAN REACH HER @ [phone]

## 2017-12-03 NOTE — Telephone Encounter (Signed)
Pt made aware. Copy to pcp.  

## 2017-12-07 ENCOUNTER — Ambulatory Visit (HOSPITAL_COMMUNITY): Payer: Medicare HMO | Admitting: Internal Medicine

## 2017-12-07 ENCOUNTER — Inpatient Hospital Stay (HOSPITAL_COMMUNITY): Payer: Medicare HMO | Attending: Hematology

## 2017-12-07 DIAGNOSIS — D472 Monoclonal gammopathy: Secondary | ICD-10-CM | POA: Insufficient documentation

## 2017-12-07 DIAGNOSIS — D509 Iron deficiency anemia, unspecified: Secondary | ICD-10-CM | POA: Insufficient documentation

## 2017-12-07 DIAGNOSIS — D500 Iron deficiency anemia secondary to blood loss (chronic): Secondary | ICD-10-CM

## 2017-12-07 LAB — BASIC METABOLIC PANEL
Anion gap: 6 (ref 5–15)
BUN: 14 mg/dL (ref 8–23)
CALCIUM: 9.5 mg/dL (ref 8.9–10.3)
CO2: 30 mmol/L (ref 22–32)
CREATININE: 1.02 mg/dL — AB (ref 0.44–1.00)
Chloride: 104 mmol/L (ref 98–111)
GFR calc Af Amer: 59 mL/min — ABNORMAL LOW (ref >60)
GFR calc non Af Amer: 51 mL/min — ABNORMAL LOW (ref >60)
GLUCOSE: 94 mg/dL (ref 70–99)
Potassium: 4.4 mmol/L (ref 3.5–5.1)
Sodium: 140 mmol/L (ref 135–145)

## 2017-12-07 LAB — IRON AND TIBC
Iron: 53 ug/dL (ref 28–170)
Saturation Ratios: 15 % (ref 10.4–31.8)
TIBC: 361 ug/dL (ref 250–450)
UIBC: 308 ug/dL

## 2017-12-07 LAB — CBC WITH DIFFERENTIAL/PLATELET
Basophils Absolute: 0 10*3/uL (ref 0.0–0.1)
Basophils Relative: 0 %
Eosinophils Absolute: 0.1 10*3/uL (ref 0.0–0.7)
Eosinophils Relative: 2 %
HEMATOCRIT: 41.4 % (ref 36.0–46.0)
Hemoglobin: 13.3 g/dL (ref 12.0–15.0)
LYMPHS PCT: 48 %
Lymphs Abs: 2.7 10*3/uL (ref 0.7–4.0)
MCH: 28.3 pg (ref 26.0–34.0)
MCHC: 32.1 g/dL (ref 30.0–36.0)
MCV: 88.1 fL (ref 78.0–100.0)
MONO ABS: 0.4 10*3/uL (ref 0.1–1.0)
MONOS PCT: 7 %
NEUTROS ABS: 2.5 10*3/uL (ref 1.7–7.7)
Neutrophils Relative %: 43 %
Platelets: 173 10*3/uL (ref 150–400)
RBC: 4.7 MIL/uL (ref 3.87–5.11)
RDW: 14.3 % (ref 11.5–15.5)
WBC: 5.8 10*3/uL (ref 4.0–10.5)

## 2017-12-07 LAB — FERRITIN: Ferritin: 17 ng/mL (ref 11–307)

## 2017-12-08 LAB — MULTIPLE MYELOMA PANEL, SERUM
ALBUMIN/GLOB SERPL: 1.1 (ref 0.7–1.7)
ALPHA2 GLOB SERPL ELPH-MCNC: 0.8 g/dL (ref 0.4–1.0)
Albumin SerPl Elph-Mcnc: 3.7 g/dL (ref 2.9–4.4)
Alpha 1: 0.2 g/dL (ref 0.0–0.4)
B-GLOBULIN SERPL ELPH-MCNC: 0.9 g/dL (ref 0.7–1.3)
Gamma Glob SerPl Elph-Mcnc: 1.5 g/dL (ref 0.4–1.8)
Globulin, Total: 3.5 g/dL (ref 2.2–3.9)
IGG (IMMUNOGLOBIN G), SERUM: 1667 mg/dL — AB (ref 700–1600)
IgA: 118 mg/dL (ref 64–422)
IgM (Immunoglobulin M), Srm: 59 mg/dL (ref 26–217)
M PROTEIN SERPL ELPH-MCNC: 0.9 g/dL — AB
Total Protein ELP: 7.2 g/dL (ref 6.0–8.5)

## 2017-12-08 LAB — KAPPA/LAMBDA LIGHT CHAINS
KAPPA FREE LGHT CHN: 23.2 mg/L — AB (ref 3.3–19.4)
Kappa, lambda light chain ratio: 1.24 (ref 0.26–1.65)
LAMDA FREE LIGHT CHAINS: 18.7 mg/L (ref 5.7–26.3)

## 2017-12-15 ENCOUNTER — Encounter (HOSPITAL_COMMUNITY): Payer: Self-pay | Admitting: Internal Medicine

## 2017-12-15 ENCOUNTER — Inpatient Hospital Stay (HOSPITAL_COMMUNITY): Payer: Medicare HMO | Attending: Internal Medicine | Admitting: Internal Medicine

## 2017-12-15 VITALS — BP 154/71 | HR 52 | Temp 98.0°F | Resp 16 | Wt 171.0 lb

## 2017-12-15 DIAGNOSIS — D500 Iron deficiency anemia secondary to blood loss (chronic): Secondary | ICD-10-CM

## 2017-12-15 DIAGNOSIS — Z7982 Long term (current) use of aspirin: Secondary | ICD-10-CM | POA: Diagnosis not present

## 2017-12-15 DIAGNOSIS — I10 Essential (primary) hypertension: Secondary | ICD-10-CM

## 2017-12-15 DIAGNOSIS — D472 Monoclonal gammopathy: Secondary | ICD-10-CM | POA: Diagnosis present

## 2017-12-15 DIAGNOSIS — E7800 Pure hypercholesterolemia, unspecified: Secondary | ICD-10-CM | POA: Diagnosis not present

## 2017-12-15 DIAGNOSIS — Z86010 Personal history of colonic polyps: Secondary | ICD-10-CM

## 2017-12-15 DIAGNOSIS — Z79899 Other long term (current) drug therapy: Secondary | ICD-10-CM | POA: Diagnosis not present

## 2017-12-15 DIAGNOSIS — E119 Type 2 diabetes mellitus without complications: Secondary | ICD-10-CM | POA: Diagnosis not present

## 2017-12-15 DIAGNOSIS — Z808 Family history of malignant neoplasm of other organs or systems: Secondary | ICD-10-CM | POA: Diagnosis not present

## 2017-12-15 DIAGNOSIS — K219 Gastro-esophageal reflux disease without esophagitis: Secondary | ICD-10-CM | POA: Diagnosis not present

## 2017-12-15 DIAGNOSIS — D509 Iron deficiency anemia, unspecified: Secondary | ICD-10-CM

## 2017-12-15 DIAGNOSIS — Z8719 Personal history of other diseases of the digestive system: Secondary | ICD-10-CM | POA: Diagnosis not present

## 2017-12-15 DIAGNOSIS — M1990 Unspecified osteoarthritis, unspecified site: Secondary | ICD-10-CM

## 2017-12-15 DIAGNOSIS — Z8051 Family history of malignant neoplasm of kidney: Secondary | ICD-10-CM

## 2017-12-15 NOTE — Progress Notes (Signed)
Diagnosis Iron deficiency anemia due to chronic blood loss - Plan: CBC with Differential/Platelet, Comprehensive metabolic panel, Lactate dehydrogenase, Ferritin, Protein electrophoresis, serum, IgG, IgA, IgM, Kappa/lambda light chains, Beta 2 microglobulin, serum  Staging Cancer Staging No matching staging information was found for the patient.  Assessment and Plan: 1.   Iron deficiency anemia.  Felt secondary to chronic GI blood loss.  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 She had colonoscopy done 09/26/2016 that showed a 4 mm polyp with pathology returning as a tubular adenoma.    Pt was last treated with IV iron on 06/06/2016.    Labs done 12/07/2017 reviewed and showed WBC 5.8 HB 13.3 plts 173,000.  Chemistries WNL with K+ 4.4 Cr 1.  Ferritin is 17.  PT was given option of repeat IV iron.  She desires to try oral iron which she will obtain OTC.  She will RTC in 06/2018 for follow-up and repeat labs.  She is advised to notify the office if she has any problems prior to her next visit.   2.  IgG lambda MGUS.  SPEP done 12/07/2017 reviewed and showed M spike of 0.9 g/dl which is stable. Kappa/lambda ratio normal at 1.24.  IGG 1667 which is stable.  Skeletal survey on 09/23/16 showed no bone lesions.  She has no evidence of anemia, hypercalcemia or renal failure.  She will have repeat labs done in 06/2018 for ongoing monitoring.   3.  HTN.  BP is 154/71.  Follow-up with PCP.    4.  DM.  Follow-up with PCP.    5.  Health maintenance.  Follow-up with GI.  Mammogram screening as recommended.    Current Status:  Pt is seen today for follow-up.  She is here to go over labs.    Problem List Patient Active Problem List   Diagnosis Date Noted  . Abdominal pain, epigastric [R10.13] 11/09/2017  . Chest pain [R07.9] 11/09/2017  . Large hiatal hernia [K44.9] 03/10/2017  . MGUS (monoclonal gammopathy of unknown significance) [D47.2] 10/06/2016  .  History of colonic polyps [Z86.010] 08/19/2016  . Anemia [D64.9] 03/14/2016  . UGIB (upper gastrointestinal bleed) [K92.2] 02/14/2016  . Diabetes ([place]) [E11.9] 02/14/2016  . Essential hypertension [I10] 02/14/2016  . Hyperlipidemia [E78.5] 02/14/2016  . Acute bronchitis [J20.9] 02/14/2016  . GERD (gastroesophageal reflux disease) [K21.9] 07/14/2011  . Screen for colon cancer [Z12.11] 07/14/2011  . Dysphagia [787.2] 07/14/2011    Past Medical History Past Medical History:  Diagnosis Date  . Anemia 03/14/2016  . Arthritis   . Asthma   . Bradycardia   . DM (diabetes mellitus) ([place])   . GERD (gastroesophageal reflux disease)   . High cholesterol   . HTN (hypertension)     Past Surgical History Past Surgical History:  Procedure Laterality Date  . CHOLECYSTECTOMY  1990s  . COLONOSCOPY  03/18/1999   Dr Rourk-int  hemorrhoids, pancolonic diverticula  . COLONOSCOPY N/A 09/26/2016   Dr. Gala Romney: Diverticulosis, 4 mm polyp removed from the descending colon which is a tubular adenoma.  No future colonoscopies unless new symptoms arise.  Marland Kitchen COLONOSCOPY WITH ESOPHAGOGASTRODUODENOSCOPY (EGD)  08/06/2011   Rourk: Schatzki ring, moderate sized hiatal hernia status post dilation for history of dysphagia. Pancolonic diverticula, single diminutive polyp at the base of the cecum removed (tubular adenoma). Next colonoscopy recommended for April 2018  . ESOPHAGOGASTRODUODENOSCOPY  07/02/01   Dr Rourk-Schatzki's ring s/p 52F maloney dilation, otherwise normal/small hiatal hernia/   Linear erosion  and ulceration in the proximal stomach/  The ulcerated lesion in the proximal stomach  benign.This may be a [name] lesion secondary to trauma to the mucosa, straddling the  diaphragmatic hiatus in the presence of a hiatal hernia   . ESOPHAGOGASTRODUODENOSCOPY N/A 02/14/2016   Dr. Gala Romney: Large hiatal hernia, [name] lesion likely explains bleeding.  Marland Kitchen MALONEY DILATION  08/06/2011   Procedure: Venia Minks DILATION;   Surgeon: Daneil Dolin, MD;  [facility];  Service: Endoscopy;  Laterality: N/A;  . POLYPECTOMY  09/26/2016   Procedure: POLYPECTOMY;  Surgeon: Daneil Dolin, MD;  Location: [facility];  Service: Endoscopy;;  descending colon  . TUBAL LIGATION      Family History Family History  Problem Relation Age of Onset  . Aneurysm Daughter   . Stroke Daughter   . Diabetes Daughter   . Hypertension Mother   . Dementia Mother   . Hypertension Father   . Kidney cancer Brother   . Cancer Brother        brain  . Cancer Brother        bone  . Stroke Brother   . Hypertension Daughter   . Hypertension Son   . Hyperlipidemia Son   . Colon cancer Neg Hx   . Inflammatory bowel disease Neg Hx      Social History  reports that she has never smoked. She has never used smokeless tobacco. She reports that she does not drink alcohol or use drugs.  Medications  Current Outpatient Medications:  .  acetaminophen (TYLENOL) 500 MG tablet, Take 500 mg by mouth daily as needed for headache., Disp: , Rfl:  .  albuterol (PROAIR HFA) 108 (90 BASE) MCG/ACT inhaler, Inhale 2 puffs into the lungs every 6 (six) hours as needed for wheezing or shortness of breath., Disp: , Rfl:  .  amLODipine (NORVASC) 5 MG tablet, Take 1 tablet (5 mg total) by mouth daily., Disp: 90 tablet, Rfl: 3 .  Ascorbic Acid (VITAMIN C) 100 MG tablet, Take 100 mg by mouth daily., Disp: , Rfl:  .  aspirin 81 MG tablet, Take 81 mg by mouth once a week. , Disp: , Rfl:  .  hydrochlorothiazide (HYDRODIURIL) 25 MG tablet, Take 25 mg by mouth daily. , Disp: , Rfl:  .  lisinopril (PRINIVIL,ZESTRIL) 40 MG tablet, Take 40 mg by mouth daily. , Disp: , Rfl:  .  loratadine (CLARITIN) 10 MG tablet, Take 1 tablet (10 mg total) by mouth daily. (Patient taking differently: Take 10 mg by mouth daily as needed for allergies. ), Disp: 30 tablet, Rfl: 0 .  nitroGLYCERIN (NITROSTAT) 0.4 MG SL tablet, Place 1 tablet (0.4 mg total) under the tongue  every 5 (five) minutes as needed for chest pain., Disp: 25 tablet, Rfl: 3 .  pantoprazole (PROTONIX) 40 MG tablet, Take 1 tablet (40 mg total) by mouth daily., Disp: 10 tablet, Rfl: 0 .  pravastatin (PRAVACHOL) 20 MG tablet, Take 20 mg by mouth at bedtime., Disp: , Rfl:   Allergies Augmentin [amoxicillin-pot clavulanate] and Sulfa antibiotics  Review of Systems Review of Systems - Oncology ROS negative   Physical Exam  Vitals Wt Readings from Last 3 Encounters:  12/15/17 171 lb (77.6 kg)  11/19/17 176 lb (79.8 kg)  11/09/17 172 lb 3.2 oz (78.1 kg)   Temp Readings from Last 3 Encounters:  12/15/17 98 F (36.7 C) (Oral)  11/09/17 (!) 97.1 F (36.2 C) (Oral)  10/31/17 98.5 F (36.9 C) (Oral)  BP Readings from Last 3 Encounters:  12/15/17 (!) 154/71  11/19/17 (!) 172/84  11/09/17 (!) 146/54   Pulse Readings from Last 3 Encounters:  12/15/17 (!) 52  11/19/17 (!) 56  11/09/17 (!) 53   Constitutional: Well-developed, well-nourished, and in no distress.   HENT: Head: Normocephalic and atraumatic.  Mouth/Throat: No oropharyngeal exudate. Mucosa moist. Eyes: Pupils are equal, round, and reactive to light. Conjunctivae are normal. No scleral icterus.  Neck: Normal range of motion. Neck supple. No JVD present.  Cardiovascular: Normal rate, regular rhythm and normal heart sounds.  Exam reveals no gallop and no friction rub.   No murmur heard. Pulmonary/Chest: Effort normal and breath sounds normal. No respiratory distress. No wheezes.No rales.  Abdominal: Soft. Bowel sounds are normal. No distension. There is no tenderness. There is no guarding.  Musculoskeletal: No edema or tenderness.  Lymphadenopathy: No cervical, axillary or supraclavicular adenopathy.  Neurological: Alert and oriented to person, place, and time. No cranial nerve deficit.  Skin: Skin is warm and dry. No rash noted. No erythema. No pallor.  Psychiatric: Affect and judgment normal.   Labs No visits with  results within 3 Day(s) from this visit.  Latest known visit with results is:  Appointment on 12/07/2017  Component Date Value Ref Range Status  . WBC 12/07/2017 5.8  4.0 - 10.5 K/uL Final  . RBC 12/07/2017 4.70  3.87 - 5.11 MIL/uL Final  . Hemoglobin 12/07/2017 13.3  12.0 - 15.0 g/dL Final  . HCT 12/07/2017 41.4  36.0 - 46.0 % Final  . MCV 12/07/2017 88.1  78.0 - 100.0 fL Final  . MCH 12/07/2017 28.3  26.0 - 34.0 pg Final  . MCHC 12/07/2017 32.1  30.0 - 36.0 g/dL Final  . RDW 12/07/2017 14.3  11.5 - 15.5 % Final  . Platelets 12/07/2017 173  150 - 400 K/uL Final  . Neutrophils Relative % 12/07/2017 43  % Final  . Neutro Abs 12/07/2017 2.5  1.7 - 7.7 K/uL Final  . Lymphocytes Relative 12/07/2017 48  % Final  . Lymphs Abs 12/07/2017 2.7  0.7 - 4.0 K/uL Final  . Monocytes Relative 12/07/2017 7  % Final  . Monocytes Absolute 12/07/2017 0.4  0.1 - 1.0 K/uL Final  . Eosinophils Relative 12/07/2017 2  % Final  . Eosinophils Absolute 12/07/2017 0.1  0.0 - 0.7 K/uL Final  . Basophils Relative 12/07/2017 0  % Final  . Basophils Absolute 12/07/2017 0.0  0.0 - 0.1 K/uL Final   Performed at [facility], [address], [place], [place] [address]  . Sodium 12/07/2017 140  135 - 145 mmol/L Final  . Potassium 12/07/2017 4.4  3.5 - 5.1 mmol/L Final  . Chloride 12/07/2017 104  98 - 111 mmol/L Final  . CO2 12/07/2017 30  22 - 32 mmol/L Final  . Glucose, Bld 12/07/2017 94  70 - 99 mg/dL Final  . BUN 12/07/2017 14  8 - 23 mg/dL Final  . Creatinine, Ser 12/07/2017 1.02* 0.44 - 1.00 mg/dL Final  . Calcium 12/07/2017 9.5  8.9 - 10.3 mg/dL Final  . GFR calc non Af Amer 12/07/2017 51* >60 mL/min Final  . GFR calc Af Amer 12/07/2017 59* >60 mL/min Final   Comment: (NOTE) The eGFR has been calculated using the CKD EPI equation. This calculation has not been validated in all clinical situations. eGFR's persistently <60 mL/min signify possible Chronic Kidney Disease.   . Anion gap 12/07/2017 6  5 - 15  Final   Performed  at [facility], [address], [place], [place] [address]  . IgG (Immunoglobin G), Serum 12/07/2017 1,667* 700 - 1,600 mg/dL Final  . IgA 12/07/2017 118  64 - 422 mg/dL Final  . IgM (Immunoglobulin M), Srm 12/07/2017 59  26 - 217 mg/dL Final  . Total Protein ELP 12/07/2017 7.2  6.0 - 8.5 g/dL Corrected  . Albumin SerPl Elph-Mcnc 12/07/2017 3.7  2.9 - 4.4 g/dL Corrected  . Alpha 1 12/07/2017 0.2  0.0 - 0.4 g/dL Corrected  . Alpha2 Glob SerPl Elph-Mcnc 12/07/2017 0.8  0.4 - 1.0 g/dL Corrected  . B-Globulin SerPl Elph-Mcnc 12/07/2017 0.9  0.7 - 1.3 g/dL Corrected  . Gamma Glob SerPl Elph-Mcnc 12/07/2017 1.5  0.4 - 1.8 g/dL Corrected  . M Protein SerPl Elph-Mcnc 12/07/2017 0.9* Not Observed g/dL Corrected  . Globulin, Total 12/07/2017 3.5  2.2 - 3.9 g/dL Corrected  . Albumin/Glob SerPl 12/07/2017 1.1  0.7 - 1.7 Corrected  . IFE 1 12/07/2017 Comment   Corrected   Comment: (NOTE) Immunofixation shows IgG monoclonal protein with lambda light chain specificity.   . Please Note 12/07/2017 Comment   Corrected   Comment: (NOTE) Protein electrophoresis scan will follow via computer, mail, or courier delivery. Performed At: [facility] [place], [place] [id number] Rush Farmer MD PQ:[id number]   . Kappa free light chain 12/07/2017 23.2* 3.3 - 19.4 mg/L Final  . Lamda free light chains 12/07/2017 18.7  5.7 - 26.3 mg/L Final  . Kappa, lamda light chain ratio 12/07/2017 1.24  0.26 - 1.65 Final   Comment: (NOTE) Performed At: [facility] [place], [place] [id number] Rush Farmer MD PJ:[id number]   . Ferritin 12/07/2017 17  11 - 307 ng/mL Final   Performed at [facility] [address] [place], [place] [address]  . Iron 12/07/2017 53  28 - 170 ug/dL Final  . TIBC 12/07/2017 361  250 - 450 ug/dL Final  . Saturation Ratios 12/07/2017 15  10.4 - 31.8 % Final  . UIBC 12/07/2017 308  ug/dL Final   Performed at [facility], [place] [address], [place], [place] [address]     Pathology Orders Placed This Encounter  Procedures  . CBC with Differential/Platelet    Standing Status:   Future    Standing Expiration Date:   12/16/2019  . Comprehensive metabolic panel    Standing Status:   Future    Standing Expiration Date:   12/16/2019  . Lactate dehydrogenase    Standing Status:   Future    Standing Expiration Date:   12/16/2019  . Ferritin    Standing Status:   Future    Standing Expiration Date:   12/16/2019  . Protein electrophoresis, serum    Standing Status:   Future    Standing Expiration Date:   12/16/2019  . IgG, IgA, IgM    Standing Status:   Future    Standing Expiration Date:   12/16/2019  . Kappa/lambda light chains    Standing Status:   Future    Standing Expiration Date:   12/16/2019  . Beta 2 microglobulin, serum    Standing Status:   Future    Standing Expiration Date:   12/16/2019       Zoila Shutter MD

## 2017-12-15 NOTE — Patient Instructions (Signed)
[place] at [facility] Discharge Instructions  You saw Dr. Walden Field today.   Thank you for choosing [place] at [facility] to provide your oncology and hematology care.  To afford each patient quality time with our provider, please arrive at least 15 minutes before your scheduled appointment time.   If you have a lab appointment with the [facility] please come in thru the  Main Entrance and check in at the main information desk  You need to re-schedule your appointment should you arrive 10 or more minutes late.  We strive to give you quality time with our providers, and arriving late affects you and other patients whose appointments are after yours.  Also, if you no show three or more times for appointments you may be dismissed from the clinic at the providers discretion.     Again, thank you for choosing [facility].  Our hope is that these requests will decrease the amount of time that you wait before being seen by our physicians.       _____________________________________________________________  Should you have questions after your visit to [facility], please contact our office at [phone] between the hours of 8:00 a.m. and 4:30 p.m.  Voicemails left after 4:00 p.m. will not be returned until the following business day.  For prescription refill requests, have your pharmacy contact our office and allow 72 hours.    Cancer Center Support Programs:   > Cancer Support Group  2nd Tuesday of the month 1pm-2pm, Journey Room  .

## 2018-02-16 ENCOUNTER — Encounter: Payer: Self-pay | Admitting: Gastroenterology

## 2018-02-16 ENCOUNTER — Ambulatory Visit: Payer: Medicare HMO | Admitting: Gastroenterology

## 2018-02-16 VITALS — BP 160/70 | HR 47 | Temp 97.5°F | Ht 64.0 in | Wt 174.8 lb

## 2018-02-16 DIAGNOSIS — K449 Diaphragmatic hernia without obstruction or gangrene: Secondary | ICD-10-CM | POA: Diagnosis not present

## 2018-02-16 NOTE — Progress Notes (Signed)
Primary Care Physician: [facility]  Primary Gastroenterologist:  [name], MD   Chief Complaint  Patient presents with  . Abdominal Pain    f/u, doing ok    HPI: Stacey Riley is a 77 y.o. female here for follow-up.  She has a history of large hiatal, history of chronic anemia requiring IV iron infusions, followed by hematology.  History of [name] lesions noted on prior EGD in 2017 when she presented with upper GI bleeding.  Earlier in the year she was having chest pain and upper abdominal pain showing significant impairment of esophageal motility, minimum gastric esophageal reflux, large hiatal hernia with more than 50% of the stomach in the inferior mediastinum.  Her PPI was increased to twice daily, had been on that previously but was decreased at some point.  On twice daily PPI her symptoms resolved.  She also noted that she changed her diet significantly and started eating right.  Currently feeling well.  No upper GI symptoms.  Bowel movements are regular.  She continues to remain very active.  She sees hematology in March for follow-up.  Hemoglobin 13.3 two months ago.  Her 10 had declined down 17, she was offered IV iron but she chose oral supplements.  Follow-up labs in March.  Current Outpatient Medications  Medication Sig Dispense Refill  . acetaminophen (TYLENOL) 500 MG tablet Take 500 mg by mouth daily as needed for headache.    . albuterol (PROAIR HFA) 108 (90 BASE) MCG/ACT inhaler Inhale 2 puffs into the lungs every 6 (six) hours as needed for wheezing or shortness of breath.    Marland Kitchen amLODipine (NORVASC) 5 MG tablet Take 1 tablet (5 mg total) by mouth daily. 90 tablet 3  . Ascorbic Acid (VITAMIN C) 100 MG tablet Take 100 mg by mouth daily.    Marland Kitchen aspirin 81 MG tablet Take 81 mg by mouth once a week.     . hydrochlorothiazide (HYDRODIURIL) 25 MG tablet Take 25 mg by mouth daily.     Marland Kitchen lisinopril (PRINIVIL,ZESTRIL) 40 MG tablet Take 40 mg by  mouth daily.     Marland Kitchen loratadine (CLARITIN) 10 MG tablet Take 1 tablet (10 mg total) by mouth daily. (Patient taking differently: Take 10 mg by mouth daily as needed for allergies. ) 30 tablet 0  . nitroGLYCERIN (NITROSTAT) 0.4 MG SL tablet Place 1 tablet (0.4 mg total) under the tongue every 5 (five) minutes as needed for chest pain. 25 tablet 3  . pantoprazole (PROTONIX) 40 MG tablet Take 1 tablet (40 mg total) by mouth daily. 10 tablet 0  . pravastatin (PRAVACHOL) 20 MG tablet Take 20 mg by mouth at bedtime.     No current facility-administered medications for this visit.     Allergies as of 02/16/2018 - Review Complete 02/16/2018  Allergen Reaction Noted  . Augmentin [amoxicillin-pot clavulanate] Nausea And Vomiting 02/14/2016  . Sulfa antibiotics Swelling 07/14/2011    ROS:  General: Negative for anorexia, weight loss, fever, chills, fatigue, weakness. ENT: Negative for hoarseness, difficulty swallowing , nasal congestion. CV: Negative for chest pain, angina, palpitations, dyspnea on exertion, peripheral edema.  Respiratory: Negative for dyspnea at rest, dyspnea on exertion, cough, sputum, wheezing.  GI: See history of present illness. GU:  Negative for dysuria, hematuria, urinary incontinence, urinary frequency, nocturnal urination.  Endo: Negative for unusual weight change.    Physical Examination:   BP (!) 183/66   Pulse (!) 47   Temp (!)  97.5 F (36.4 C) (Oral)   Ht 5\' 4"  (1.626 m)   Wt 174 lb 12.8 oz (79.3 kg)   BMI 30.00 kg/m   General: Well-nourished, well-developed in no acute distress.  Eyes: No icterus. Mouth: Oropharyngeal mucosa moist and pink , no lesions erythema or exudate. Lungs: Clear to auscultation bilaterally.  Heart: Regular rate and rhythm, no murmurs rubs or gallops.  Abdomen: Bowel sounds are normal, nontender, nondistended, no hepatosplenomegaly or masses, no abdominal bruits or hernia , no rebound or guarding.   Extremities: No lower extremity  edema. No clubbing or deformities. Neuro: Alert and oriented x 4   Skin: Warm and dry, no jaundice.   Psych: Alert and cooperative, normal mood and affect.  Labs:  Lab Results  Component Value Date   CREATININE 1.02 (H) 12/07/2017   BUN 14 12/07/2017   NA 140 12/07/2017   K 4.4 12/07/2017   CL 104 12/07/2017   CO2 30 12/07/2017   Lab Results  Component Value Date   WBC 5.8 12/07/2017   HGB 13.3 12/07/2017   HCT 41.4 12/07/2017   MCV 88.1 12/07/2017   PLT 173 12/07/2017   Lab Results  Component Value Date   IRON 53 12/07/2017   TIBC 361 12/07/2017   FERRITIN 17 12/07/2017   Lab Results  Component Value Date   ALT 28 10/31/2017   AST 30 10/31/2017   ALKPHOS 49 10/31/2017   BILITOT 0.6 10/31/2017    Imaging Studies: No results found.

## 2018-02-16 NOTE — Assessment & Plan Note (Signed)
Medically she is doing well.  Asymptomatic on twice daily PPI.  She gets her Protonix to the mail-order states she has 2 bottles at home.  The bottle she brought in today was reviewed and stated to take once a day but she states her other bottles have twice daily on it.  If she starts running low she will let me know.  Unless she has any problems, we will see her back in 6 months.

## 2018-02-16 NOTE — Patient Instructions (Signed)
1. Continue pantoprazole 40 mg twice daily before a meal. 2. See back in 6 months, call sooner if needed.

## 2018-02-16 NOTE — Progress Notes (Signed)
cc'ed to pcp °

## 2018-05-13 ENCOUNTER — Emergency Department (HOSPITAL_COMMUNITY): Payer: Medicare HMO

## 2018-05-13 ENCOUNTER — Encounter (HOSPITAL_COMMUNITY): Payer: Self-pay

## 2018-05-13 ENCOUNTER — Other Ambulatory Visit: Payer: Self-pay

## 2018-05-13 ENCOUNTER — Emergency Department (HOSPITAL_COMMUNITY)
Admission: EM | Admit: 2018-05-13 | Discharge: 2018-05-13 | Disposition: A | Discharge status: Home / Self Care | Payer: Medicare HMO | Attending: Emergency Medicine | Admitting: Emergency Medicine

## 2018-05-13 DIAGNOSIS — E860 Dehydration: Secondary | ICD-10-CM

## 2018-05-13 DIAGNOSIS — Z79899 Other long term (current) drug therapy: Secondary | ICD-10-CM | POA: Diagnosis not present

## 2018-05-13 DIAGNOSIS — R42 Dizziness and giddiness: Secondary | ICD-10-CM

## 2018-05-13 DIAGNOSIS — I10 Essential (primary) hypertension: Secondary | ICD-10-CM | POA: Insufficient documentation

## 2018-05-13 DIAGNOSIS — J45909 Unspecified asthma, uncomplicated: Secondary | ICD-10-CM | POA: Insufficient documentation

## 2018-05-13 DIAGNOSIS — Z7982 Long term (current) use of aspirin: Secondary | ICD-10-CM | POA: Diagnosis not present

## 2018-05-13 DIAGNOSIS — E119 Type 2 diabetes mellitus without complications: Secondary | ICD-10-CM | POA: Insufficient documentation

## 2018-05-13 LAB — URINALYSIS, ROUTINE W REFLEX MICROSCOPIC
Bilirubin Urine: NEGATIVE
Cellular Cast, UA: 30
GLUCOSE, UA: NEGATIVE mg/dL
Ketones, ur: 5 mg/dL — AB
LEUKOCYTES UA: NEGATIVE
NITRITE: NEGATIVE
Protein, ur: 30 mg/dL — AB
Specific Gravity, Urine: 1.023 (ref 1.005–1.030)
pH: 5 (ref 5.0–8.0)

## 2018-05-13 LAB — BASIC METABOLIC PANEL
ANION GAP: 11 (ref 5–15)
BUN: 17 mg/dL (ref 8–23)
CALCIUM: 9.1 mg/dL (ref 8.9–10.3)
CO2: 24 mmol/L (ref 22–32)
Chloride: 100 mmol/L (ref 98–111)
Creatinine, Ser: 1.08 mg/dL — ABNORMAL HIGH (ref 0.44–1.00)
GFR, EST AFRICAN AMERICAN: 57 mL/min — AB (ref >60)
GFR, EST NON AFRICAN AMERICAN: 49 mL/min — AB (ref >60)
Glucose, Bld: 155 mg/dL — ABNORMAL HIGH (ref 70–99)
POTASSIUM: 3.6 mmol/L (ref 3.5–5.1)
Sodium: 135 mmol/L (ref 135–145)

## 2018-05-13 LAB — CBC
HCT: 42.1 % (ref 36.0–46.0)
HEMOGLOBIN: 13.6 g/dL (ref 12.0–15.0)
MCH: 28.4 pg (ref 26.0–34.0)
MCHC: 32.3 g/dL (ref 30.0–36.0)
MCV: 87.9 fL (ref 80.0–100.0)
NRBC: 0 % (ref 0.0–0.2)
PLATELETS: 168 10*3/uL (ref 150–400)
RBC: 4.79 MIL/uL (ref 3.87–5.11)
RDW: 14 % (ref 11.5–15.5)
WBC: 8.7 10*3/uL (ref 4.0–10.5)

## 2018-05-13 LAB — TROPONIN I: Troponin I: <0.03 ng/mL

## 2018-05-13 LAB — CBG MONITORING, ED: GLUCOSE-CAPILLARY: 143 mg/dL — AB (ref 70–99)

## 2018-05-13 MED ORDER — SODIUM CHLORIDE 0.9 % IV BOLUS
500.0000 mL | Freq: Once | INTRAVENOUS | Status: AC
  Administered 2018-05-13: 500 mL via INTRAVENOUS

## 2018-05-13 NOTE — ED Provider Notes (Signed)
[facility] EMERGENCY DEPARTMENT Provider Note   CSN: [id number] Arrival date & time: 05/13/18  1031     History   Chief Complaint Chief Complaint  Patient presents with  . Dizziness    HPI Stacey Riley is a 78 y.o. female.  HPI Patient presents she is been feeling bad for the last couple days.  States she feels lightheaded when she stands up.  Has a dull headache.  Has had decreased appetite.  States she just feels fatigued.  No chest pain.  No trouble breathing.  No localizing numbness or weakness.  No confusion.  No fevers.  Has had somewhat less oral intake.  Occasional cough.  Patient was not orthostatic, however when she was walking back to the room her blood pressure went down to 83 systolic. Past Medical History:  Diagnosis Date  . Anemia 03/14/2016  . Arthritis   . Asthma   . Bradycardia   . DM (diabetes mellitus) ([place])   . GERD (gastroesophageal reflux disease)   . High cholesterol   . HTN (hypertension)     Patient Active Problem List   Diagnosis Date Noted  . Abdominal pain, epigastric 11/09/2017  . Chest pain 11/09/2017  . Large hiatal hernia 03/10/2017  . 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 History of colonic polyps 08/19/2016  . Anemia 03/14/2016  . UGIB (upper gastrointestinal bleed) 02/14/2016  . Diabetes ([place]) 02/14/2016  . Essential hypertension 02/14/2016  . Hyperlipidemia 02/14/2016  . Acute bronchitis 02/14/2016  . GERD (gastroesophageal reflux disease) 07/14/2011  . Screen for colon cancer 07/14/2011  . Dysphagia 07/14/2011    Past Surgical History:  Procedure Laterality Date  . CHOLECYSTECTOMY  1990s  . COLONOSCOPY  03/18/1999   Dr Rourk-int  hemorrhoids, pancolonic diverticula  . COLONOSCOPY N/A 09/26/2016   Dr. Gala Romney: Diverticulosis, 4 mm polyp removed from the descending colon which is a tubular adenoma.  No future colonoscopies unless new symptoms arise.  Marland Kitchen COLONOSCOPY WITH ESOPHAGOGASTRODUODENOSCOPY  (EGD)  08/06/2011   Rourk: Schatzki ring, moderate sized hiatal hernia status post dilation for history of dysphagia. Pancolonic diverticula, single diminutive polyp at the base of the cecum removed (tubular adenoma). Next colonoscopy recommended for April 2018  . ESOPHAGOGASTRODUODENOSCOPY  07/02/01   Dr Rourk-Schatzki's ring s/p 15F maloney dilation, otherwise normal/small hiatal hernia/   Linear erosion and ulceration in the proximal stomach/  The ulcerated lesion in the proximal stomach  benign.This may be a [name] lesion secondary to trauma to the mucosa, straddling the  diaphragmatic hiatus in the presence of a hiatal hernia   . ESOPHAGOGASTRODUODENOSCOPY N/A 02/14/2016   Dr. Gala Romney: Large hiatal hernia, [name] lesion likely explains bleeding.  Marland Kitchen MALONEY DILATION  08/06/2011   Procedure: Venia Minks DILATION;  Surgeon: Daneil Dolin, MD;  Location: [facility];  Service: Endoscopy;  Laterality: N/A;  . POLYPECTOMY  09/26/2016   Procedure: POLYPECTOMY;  Surgeon: Daneil Dolin, MD;  Location: [facility] ENDO SUITE;  Service: Endoscopy;;  descending colon  . TUBAL LIGATION       OB History   No obstetric history on file.      Home Medications    Prior to Admission medications   Medication Sig Start Date End Date Taking? Authorizing Provider  acetaminophen (TYLENOL) 500 MG tablet Take 500 mg by mouth daily as needed for headache.   Yes [provider]  albuterol (PROAIR HFA) 108 (90 BASE) MCG/ACT inhaler Inhale 2 puffs into the lungs every 6 (six) hours as  needed for wheezing or shortness of breath.   Yes [provider]  amLODipine (NORVASC) 5 MG tablet Take 1 tablet (5 mg total) by mouth daily. 11/19/17 05/13/18 Yes Strader, Fransisco Hertz, PA-C  Ascorbic Acid (VITAMIN C) 100 MG tablet Take 100 mg by mouth daily.   Yes [provider]  aspirin 81 MG tablet Take 81 mg by mouth once a week.    Yes [provider]  hydrochlorothiazide (HYDRODIURIL) 25 MG tablet Take 25  mg by mouth daily.  04/22/11  Yes [provider]  lisinopril (PRINIVIL,ZESTRIL) 40 MG tablet Take 40 mg by mouth daily.  04/22/11  Yes [provider]  loratadine (CLARITIN) 10 MG tablet Take 1 tablet (10 mg total) by mouth daily. Patient taking differently: Take 10 mg by mouth daily as needed for allergies.  02/16/16  Yes Thurnell Lose, MD  nitroGLYCERIN (NITROSTAT) 0.4 MG SL tablet Place 1 tablet (0.4 mg total) under the tongue every 5 (five) minutes as needed for chest pain. 11/19/17  Yes Strader, [place] M, PA-C  pantoprazole (PROTONIX) 40 MG tablet Take 1 tablet (40 mg total) by mouth daily. 10/27/17  Yes [name], MD  pravastatin (PRAVACHOL) 20 MG tablet Take 20 mg by mouth at bedtime.   Yes [provider]    Family History Family History  Problem Relation Age of Onset  . Aneurysm Daughter   . Stroke Daughter   . Diabetes Daughter   . Hypertension Mother   . Dementia Mother   . Hypertension Father   . Kidney cancer Brother   . Cancer Brother        brain  . Cancer Brother        bone  . Stroke Brother   . Hypertension Daughter   . Hypertension Son   . Hyperlipidemia Son   . Colon cancer Neg Hx   . Inflammatory bowel disease Neg Hx     Social History Social History   Tobacco Use  . Smoking status: Never Smoker  . Smokeless tobacco: Never Used  Substance Use Topics  . Alcohol use: No  . Drug use: No     Allergies   Augmentin [amoxicillin-pot clavulanate]; Penicillin g; and Sulfa antibiotics   Review of Systems Review of Systems  Constitutional: Positive for appetite change.  HENT: Negative for congestion.   Respiratory: Negative for shortness of breath.   Cardiovascular: Negative for chest pain.  Gastrointestinal: Positive for abdominal pain.  Genitourinary: Negative for flank pain.  Musculoskeletal: Negative for back pain.  Skin: Negative for rash.  Neurological: Positive for light-headedness and headaches.    Psychiatric/Behavioral: Negative for confusion.     Physical Exam Updated Vital Signs BP 138/60   Pulse 85   Temp 100 F (37.8 C) (Oral)   Resp (!) 28   Ht 5\' 4"  (1.626 m)   Wt 78.9 kg   SpO2 97%   BMI 29.87 kg/m   Physical Exam HENT:     Head: Normocephalic.     Mouth/Throat:     Mouth: Mucous membranes are moist.  Eyes:     Extraocular Movements: Extraocular movements intact.  Neck:     Musculoskeletal: Neck supple.  Cardiovascular:     Rate and Rhythm: Regular rhythm.  Pulmonary:     Effort: Pulmonary effort is normal.  Abdominal:     Palpations: Abdomen is soft. There is no mass.  Musculoskeletal:        General: No deformity.  Skin:  Coloration: Skin is not jaundiced.  Neurological:     General: No focal deficit present.     Mental Status: She is alert.      ED Treatments / Results  Labs (all labs ordered are listed, but only abnormal results are displayed) Labs Reviewed  BASIC METABOLIC PANEL - Abnormal; Notable for the following components:      Result Value   Glucose, Bld 155 (*)    Creatinine, Ser 1.08 (*)    GFR calc non Af Amer 49 (*)    GFR calc Af Amer 57 (*)    All other components within normal limits  URINALYSIS, ROUTINE W REFLEX MICROSCOPIC - Abnormal; Notable for the following components:   APPearance HAZY (*)    Hgb urine dipstick SMALL (*)    Ketones, ur 5 (*)    Protein, ur 30 (*)    Bacteria, UA RARE (*)    All other components within normal limits  CBG MONITORING, ED - Abnormal; Notable for the following components:   Glucose-Capillary 143 (*)    All other components within normal limits  CBC  TROPONIN I    EKG EKG Interpretation  Date/Time:  Thursday May 13 2018 11:09:26 EST Ventricular Rate:  91 PR Interval:    QRS Duration: 117 QT Interval:  363 QTC Calculation: 447 R Axis:   -20 Text Interpretation:  Sinus rhythm LVH with IVCD and secondary repol abnrm Confirmed by Davonna Belling [phone]) on 05/13/2018  2:01:30 PM   Radiology Dg Chest 2 View  Result Date: 05/13/2018 CLINICAL DATA:  Shortness of breath. EXAM: CHEST - 2 VIEW COMPARISON:  10/31/2017 and prior radiographs FINDINGS: Cardiomegaly, mild interstitial prominence and large hiatal hernia again noted. There is no evidence of focal airspace disease, pulmonary edema, suspicious pulmonary nodule/mass, pleural effusion, or pneumothorax. No acute bony abnormalities are identified. A mild to moderate apex RIGHT thoracic scoliosis is again identified. IMPRESSION: Cardiomegaly without evidence of acute cardiopulmonary disease. Large hiatal hernia. Electronically Signed   By: Margarette Canada M.D.   On: 05/13/2018 12:55    Procedures Procedures (including critical care time)  Medications Ordered in ED Medications  sodium chloride 0.9 % bolus 500 mL (0 mLs Intravenous Stopped 05/13/18 1425)     Initial Impression / Assessment and Plan / ED Course  I have reviewed the triage vital signs and the nursing notes.  Pertinent labs & imaging results that were available during my care of the patient were reviewed by me and considered in my medical decision making (see chart for details).     Patient with orthostatic findings.  Had been hypotensive.  Feels better after IV fluid.  Lab work overall reassuring.  Rather benign exam.  States she feels better and will discharge home for outpatient follow-up as needed.  Does not appear to need further evaluation at this time.  Final Clinical Impressions(s) / ED Diagnoses   Final diagnoses:  Lightheadedness  Dehydration    ED Discharge Orders    None       Davonna Belling, MD 05/13/18 1527

## 2018-05-13 NOTE — ED Triage Notes (Signed)
Pt sent by [facility] due to complaints of dizziness upon standing that began when pt woke up yesterday approx 6 am. Pt reports left side of head began hurting approx 0730 yesterday morning. Also reports pain in lower abdomen and left shoulder which has been hurting "awhile"

## 2018-05-13 NOTE — ED Notes (Signed)
Pt became unsteady while walking to room. Bp check when she got in room 83/63. EDP notifed

## 2018-05-18 ENCOUNTER — Ambulatory Visit: Payer: Medicare HMO | Admitting: Cardiovascular Disease

## 2018-06-15 ENCOUNTER — Inpatient Hospital Stay (HOSPITAL_COMMUNITY): Payer: Medicare HMO | Attending: Hematology

## 2018-06-15 ENCOUNTER — Other Ambulatory Visit: Payer: Self-pay

## 2018-06-15 DIAGNOSIS — K319 Disease of stomach and duodenum, unspecified: Secondary | ICD-10-CM | POA: Diagnosis not present

## 2018-06-15 DIAGNOSIS — I10 Essential (primary) hypertension: Secondary | ICD-10-CM | POA: Diagnosis not present

## 2018-06-15 DIAGNOSIS — D500 Iron deficiency anemia secondary to blood loss (chronic): Secondary | ICD-10-CM | POA: Insufficient documentation

## 2018-06-15 DIAGNOSIS — D472 Monoclonal gammopathy: Secondary | ICD-10-CM | POA: Insufficient documentation

## 2018-06-15 DIAGNOSIS — Z79899 Other long term (current) drug therapy: Secondary | ICD-10-CM | POA: Diagnosis not present

## 2018-06-15 DIAGNOSIS — K219 Gastro-esophageal reflux disease without esophagitis: Secondary | ICD-10-CM | POA: Insufficient documentation

## 2018-06-15 DIAGNOSIS — Z7982 Long term (current) use of aspirin: Secondary | ICD-10-CM | POA: Diagnosis not present

## 2018-06-15 DIAGNOSIS — E785 Hyperlipidemia, unspecified: Secondary | ICD-10-CM | POA: Insufficient documentation

## 2018-06-15 DIAGNOSIS — E119 Type 2 diabetes mellitus without complications: Secondary | ICD-10-CM | POA: Insufficient documentation

## 2018-06-15 DIAGNOSIS — K922 Gastrointestinal hemorrhage, unspecified: Secondary | ICD-10-CM | POA: Insufficient documentation

## 2018-06-15 LAB — CBC WITH DIFFERENTIAL/PLATELET
Abs Immature Granulocytes: 0.01 10*3/uL (ref 0.00–0.07)
Basophils Absolute: 0 10*3/uL (ref 0.0–0.1)
Basophils Relative: 1 %
EOS ABS: 0.1 10*3/uL (ref 0.0–0.5)
Eosinophils Relative: 3 %
HCT: 40.7 % (ref 36.0–46.0)
Hemoglobin: 12.4 g/dL (ref 12.0–15.0)
IMMATURE GRANULOCYTES: 0 %
Lymphocytes Relative: 41 %
Lymphs Abs: 2.1 10*3/uL (ref 0.7–4.0)
MCH: 27.5 pg (ref 26.0–34.0)
MCHC: 30.5 g/dL (ref 30.0–36.0)
MCV: 90.2 fL (ref 80.0–100.0)
Monocytes Absolute: 0.5 10*3/uL (ref 0.1–1.0)
Monocytes Relative: 10 %
Neutro Abs: 2.4 10*3/uL (ref 1.7–7.7)
Neutrophils Relative %: 45 %
Platelets: 190 10*3/uL (ref 150–400)
RBC: 4.51 MIL/uL (ref 3.87–5.11)
RDW: 14.4 % (ref 11.5–15.5)
WBC: 5.2 10*3/uL (ref 4.0–10.5)
nRBC: 0 % (ref 0.0–0.2)

## 2018-06-15 LAB — COMPREHENSIVE METABOLIC PANEL
ALT: 22 U/L (ref 0–44)
AST: 25 U/L (ref 15–41)
Albumin: 3.8 g/dL (ref 3.5–5.0)
Alkaline Phosphatase: 45 U/L (ref 38–126)
Anion gap: 8 (ref 5–15)
BUN: 20 mg/dL (ref 8–23)
CO2: 25 mmol/L (ref 22–32)
Calcium: 8.9 mg/dL (ref 8.9–10.3)
Chloride: 107 mmol/L (ref 98–111)
Creatinine, Ser: 1.14 mg/dL — ABNORMAL HIGH (ref 0.44–1.00)
GFR calc Af Amer: 53 mL/min — ABNORMAL LOW (ref >60)
GFR calc non Af Amer: 46 mL/min — ABNORMAL LOW (ref >60)
Glucose, Bld: 106 mg/dL — ABNORMAL HIGH (ref 70–99)
Potassium: 3.8 mmol/L (ref 3.5–5.1)
Sodium: 140 mmol/L (ref 135–145)
Total Bilirubin: 0.4 mg/dL (ref 0.3–1.2)
Total Protein: 7.3 g/dL (ref 6.5–8.1)

## 2018-06-15 LAB — FERRITIN: Ferritin: 47 ng/mL (ref 11–307)

## 2018-06-15 LAB — LACTATE DEHYDROGENASE: LDH: 121 U/L (ref 98–192)

## 2018-06-16 LAB — KAPPA/LAMBDA LIGHT CHAINS
Kappa free light chain: 21.8 mg/L — ABNORMAL HIGH (ref 3.3–19.4)
Kappa, lambda light chain ratio: 1.29 (ref 0.26–1.65)
Lambda free light chains: 16.9 mg/L (ref 5.7–26.3)

## 2018-06-16 LAB — BETA 2 MICROGLOBULIN, SERUM: Beta-2 Microglobulin: 1.4 mg/L (ref 0.6–2.4)

## 2018-06-16 LAB — IGG, IGA, IGM
IgA: 116 mg/dL (ref 64–422)
IgG (Immunoglobin G), Serum: 1545 mg/dL (ref 700–1600)
IgM (Immunoglobulin M), Srm: 60 mg/dL (ref 26–217)

## 2018-06-17 LAB — PROTEIN ELECTROPHORESIS, SERUM
A/G Ratio: 1.2 (ref 0.7–1.7)
Albumin ELP: 3.8 g/dL (ref 2.9–4.4)
Alpha-1-Globulin: 0.2 g/dL (ref 0.0–0.4)
Alpha-2-Globulin: 0.8 g/dL (ref 0.4–1.0)
Beta Globulin: 0.9 g/dL (ref 0.7–1.3)
Gamma Globulin: 1.4 g/dL (ref 0.4–1.8)
Globulin, Total: 3.2 g/dL (ref 2.2–3.9)
M-Spike, %: 0.8 g/dL — ABNORMAL HIGH
Total Protein ELP: 7 g/dL (ref 6.0–8.5)

## 2018-06-21 ENCOUNTER — Encounter: Payer: Self-pay | Admitting: Cardiovascular Disease

## 2018-06-21 ENCOUNTER — Ambulatory Visit: Payer: Medicare HMO | Admitting: Cardiovascular Disease

## 2018-06-21 VITALS — BP 152/74 | HR 66 | Ht 64.0 in | Wt 168.8 lb

## 2018-06-21 DIAGNOSIS — R001 Bradycardia, unspecified: Secondary | ICD-10-CM | POA: Diagnosis not present

## 2018-06-21 DIAGNOSIS — E7800 Pure hypercholesterolemia, unspecified: Secondary | ICD-10-CM

## 2018-06-21 DIAGNOSIS — I10 Essential (primary) hypertension: Secondary | ICD-10-CM | POA: Diagnosis not present

## 2018-06-21 DIAGNOSIS — K219 Gastro-esophageal reflux disease without esophagitis: Secondary | ICD-10-CM

## 2018-06-21 DIAGNOSIS — R079 Chest pain, unspecified: Secondary | ICD-10-CM | POA: Diagnosis not present

## 2018-06-21 MED ORDER — PANTOPRAZOLE SODIUM 40 MG PO TBEC: 40.0000 mg | DELAYED_RELEASE_TABLET | Freq: Every day | ORAL | 3 refills | Status: DC

## 2018-06-21 MED ORDER — AMLODIPINE BESYLATE 10 MG PO TABS: 10.0000 mg | ORAL_TABLET | Freq: Every day | ORAL | 3 refills | Status: DC

## 2018-06-21 NOTE — Patient Instructions (Addendum)
Medication Instructions: Take Protonix 40 mg daily  INCREASE Norvasc to 10 mg daily Labwork: None today  Procedures/Testing: None today  Follow-Up: As needed with Dr.Koneswaran  Any Additional Special Instructions Will Be Listed Below (If Applicable).     If you need a refill on your cardiac medications before your next appointment, please call your pharmacy.

## 2018-06-21 NOTE — Addendum Note (Signed)
Addended by: Barbarann Ehlers A on: 06/21/2018 03:24 PM   Modules accepted: Orders

## 2018-06-21 NOTE — Progress Notes (Signed)
SUBJECTIVE: The patient presents for routine follow up. She was evaluated for orthostatic dizziness in the ED on 05/13/18 and received IV fluids.  ECG showed sinus rhythm with nonspecific QRS widening and LVH with consequent repolarization abnormalities.  She underwent a nuclear stress test on 11/27/2017.  Myocardial perfusion was grossly normal.  There were horizontal ST segment depressions of 1 mm noted in inferior leads and V5 and V6.  There was also a brief Wenckebach in recovery.  She denies exertional chest pain and dyspnea.  Her primary complaints relate to GERD.  She said she takes Protonix as needed as per her PCPs recommendation.  However, she takes Protonix almost on a daily basis as she has frequent GERD symptoms.  She denies dizziness, palpitations, leg swelling as well.    Review of Systems: As per "subjective", otherwise negative.  Allergies  Allergen Reactions  . Augmentin [Amoxicillin-Pot Clavulanate] Nausea And Vomiting    Hematemesis Has patient had a PCN reaction causing immediate rash, facial/tongue/throat swelling, SOB or lightheadedness with hypotension: Unknown Has patient had a PCN reaction causing severe rash involving mucus membranes or skin necrosis: Unknown Has patient had a PCN reaction that required hospitalization: Yes Has patient had a PCN reaction occurring within the last 10 years: Yes If all of the above answers are "NO", then may proceed with Cephalosporin use.   Marland Kitchen Penicillin G Nausea And Vomiting    .Did it involve swelling of the face/tongue/throat, SOB, or low BP? No Did it involve sudden or severe rash/hives, skin peeling, or any reaction on the inside of your mouth or nose? Unknown Did you need to seek medical attention at a hospital or doctor's office? yes When did it last happen?Unknown If all above answers are "NO", may proceed with cephalosporin use.   . Sulfa Antibiotics Swelling    Current Outpatient Medications    Medication Sig Dispense Refill  . acetaminophen (TYLENOL) 500 MG tablet Take 500 mg by mouth daily as needed for headache.    . albuterol (PROAIR HFA) 108 (90 BASE) MCG/ACT inhaler Inhale 2 puffs into the lungs every 6 (six) hours as needed for wheezing or shortness of breath.    Marland Kitchen amLODipine (NORVASC) 5 MG tablet Take 1 tablet (5 mg total) by mouth daily. 90 tablet 3  . Ascorbic Acid (VITAMIN C) 100 MG tablet Take 100 mg by mouth daily.    Marland Kitchen aspirin 81 MG tablet Take 81 mg by mouth once a week.     . hydrochlorothiazide (HYDRODIURIL) 25 MG tablet Take 25 mg by mouth daily.     Marland Kitchen lisinopril (PRINIVIL,ZESTRIL) 40 MG tablet Take 40 mg by mouth daily.     Marland Kitchen loratadine (CLARITIN) 10 MG tablet Take 1 tablet (10 mg total) by mouth daily. (Patient taking differently: Take 10 mg by mouth daily as needed for allergies. ) 30 tablet 0  . nitroGLYCERIN (NITROSTAT) 0.4 MG SL tablet Place 1 tablet (0.4 mg total) under the tongue every 5 (five) minutes as needed for chest pain. 25 tablet 3  . pantoprazole (PROTONIX) 40 MG tablet Take 40 mg by mouth as needed.    . pravastatin (PRAVACHOL) 20 MG tablet Take 20 mg by mouth at bedtime.     No current facility-administered medications for this visit.     Past Medical History:  Diagnosis Date  . Anemia 03/14/2016  . Arthritis   . Asthma   . Bradycardia   . DM (diabetes mellitus) ([place])   .  GERD (gastroesophageal reflux disease)   . High cholesterol   . HTN (hypertension)     Past Surgical History:  Procedure Laterality Date  . CHOLECYSTECTOMY  1990s  . COLONOSCOPY  03/18/1999   Dr Rourk-int  hemorrhoids, pancolonic diverticula  . COLONOSCOPY N/A 09/26/2016   Dr. Gala Romney: Diverticulosis, 4 mm polyp removed from the descending colon which is a tubular adenoma.  No future colonoscopies unless new symptoms arise.  Marland Kitchen COLONOSCOPY WITH ESOPHAGOGASTRODUODENOSCOPY (EGD)  08/06/2011   Rourk: Schatzki ring, moderate sized hiatal hernia status post dilation for  history of dysphagia. Pancolonic diverticula, single diminutive polyp at the base of the cecum removed (tubular adenoma). Next colonoscopy recommended for April 2018  . ESOPHAGOGASTRODUODENOSCOPY  07/02/01   Dr Rourk-Schatzki's ring s/p 59F maloney dilation, otherwise normal/small hiatal hernia/   Linear erosion and ulceration in the proximal stomach/  The ulcerated lesion in the proximal stomach  benign.This may be a [name] lesion secondary to trauma to the mucosa, straddling the  diaphragmatic hiatus in the presence of a hiatal hernia   . ESOPHAGOGASTRODUODENOSCOPY N/A 02/14/2016   Dr. Gala Romney: Large hiatal hernia, [name] lesion likely explains bleeding.  Marland Kitchen MALONEY DILATION  08/06/2011   Procedure: Venia Minks DILATION;  Surgeon: Daneil Dolin, MD;  Location[facility];  Service: Endoscopy;  Laterality: N/A;  . POLYPECTOMY  09/26/2016   Procedure: POLYPECTOMY;  Surgeon: Daneil Dolin, MD;  Location: [facility];  Service: Endoscopy;;  descending colon  . TUBAL LIGATION      Social History   Socioeconomic History  . Marital status: Divorced    Spouse name: Not on file  . Number of children: 5  . Years of education: Not on file  . Highest education level: Not on file  Occupational History  . Occupation: retired; Copy  . Financial resource strain: Not on file  . Food insecurity:    Worry: Not on file    Inability: Not on file  . Transportation needs:    Medical: Not on file    Non-medical: Not on file  Tobacco Use  . Smoking status: Never Smoker  . Smokeless tobacco: Never Used  Substance and Sexual Activity  . Alcohol use: No  . Drug use: No  . Sexual activity: Not on file  Lifestyle  . Physical activity:    Days per week: Not on file    Minutes per session: Not on file  . Stress: Not on file  Relationships  . Social connections:    Talks on phone: Not on file    Gets together: Not on file    Attends religious service: Not on file    Active member of  club or organization: Not on file    Attends meetings of clubs or organizations: Not on file    Relationship status: Not on file  . Intimate partner violence:    Fear of current or ex partner: Not on file    Emotionally abused: Not on file    Physically abused: Not on file    Forced sexual activity: Not on file  Other Topics Concern  . Not on file  Social History Narrative   Lives alone     Vitals:   06/21/18 1435  BP: (!) 152/74  Pulse: 66  SpO2: 98%  Weight: 168 lb 12.8 oz (76.6 kg)  Height: 5\' 4"  (1.626 m)    Wt Readings from Last 3 Encounters:  06/21/18 168 lb 12.8 oz (76.6 kg)  05/13/18  174 lb (78.9 kg)  02/16/18 174 lb 12.8 oz (79.3 kg)     PHYSICAL EXAM General: NAD HEENT: Normal. Neck: No JVD, no thyromegaly. Lungs: Clear to auscultation bilaterally with normal respiratory effort. CV: Regular rate and rhythm, normal S1/S2, no S3/S4, no murmur. No pretibial or periankle edema.  No carotid bruit.   Abdomen: Soft, nontender, no distention.  Neurologic: Alert and oriented.  Psych: Normal affect. Skin: Normal. Musculoskeletal: No gross deformities.    ECG: Reviewed above under Subjective   Labs: Lab Results  Component Value Date/Time   K 3.8 06/15/2018 11:00 AM   BUN 20 06/15/2018 11:00 AM   CREATININE 1.14 (H) 06/15/2018 11:00 AM   ALT 22 06/15/2018 11:00 AM   TSH 1.480 09/10/2017 10:00 AM   HGB 12.4 06/15/2018 11:00 AM     Lipids: No results found for: LDLCALC, LDLDIRECT, CHOL, TRIG, HDL     ASSESSMENT AND PLAN: 1.  Chest pain: She underwent a nuclear stress test on 11/27/2017.  Myocardial perfusion was grossly normal.  There were horizontal ST segment depressions of 1 mm noted in inferior leads and V5 and V6.  There was also a brief Wenckebach in recovery.  Current symptoms relate to GERD.  I have recommended she take Protonix 40 mg daily.  2. Bradycardia: HR in 60 bpm range today. No evidence of chronotropic incompetence with stress  testing.  3.  Hypertension: Blood pressure is mildly elevated.  I will increase amlodipine to 10 mg daily.  4.  Hyperlipidemia: Continue pravastatin 20 mg.  5.  GERD: She has symptoms on a near daily basis.  She has been taking Protonix as needed but takes it on a near daily basis.  I have instructed her to take 40 mg daily.   Disposition: Follow up with me as needed   Kate Sable, M.D., F.A.C.C.

## 2018-06-22 ENCOUNTER — Other Ambulatory Visit: Payer: Self-pay

## 2018-06-22 ENCOUNTER — Inpatient Hospital Stay (HOSPITAL_COMMUNITY): Payer: Medicare HMO | Admitting: Internal Medicine

## 2018-06-22 VITALS — BP 149/39 | HR 61 | Temp 97.8°F | Resp 16 | Wt 168.5 lb

## 2018-06-22 DIAGNOSIS — K319 Disease of stomach and duodenum, unspecified: Secondary | ICD-10-CM

## 2018-06-22 DIAGNOSIS — E785 Hyperlipidemia, unspecified: Secondary | ICD-10-CM

## 2018-06-22 DIAGNOSIS — E119 Type 2 diabetes mellitus without complications: Secondary | ICD-10-CM

## 2018-06-22 DIAGNOSIS — K922 Gastrointestinal hemorrhage, unspecified: Secondary | ICD-10-CM | POA: Diagnosis not present

## 2018-06-22 DIAGNOSIS — I10 Essential (primary) hypertension: Secondary | ICD-10-CM

## 2018-06-22 DIAGNOSIS — Z7982 Long term (current) use of aspirin: Secondary | ICD-10-CM

## 2018-06-22 DIAGNOSIS — D472 Monoclonal gammopathy: Secondary | ICD-10-CM | POA: Diagnosis not present

## 2018-06-22 DIAGNOSIS — K219 Gastro-esophageal reflux disease without esophagitis: Secondary | ICD-10-CM

## 2018-06-22 DIAGNOSIS — D500 Iron deficiency anemia secondary to blood loss (chronic): Secondary | ICD-10-CM

## 2018-06-22 DIAGNOSIS — Z79899 Other long term (current) drug therapy: Secondary | ICD-10-CM

## 2018-06-22 NOTE — Progress Notes (Signed)
Diagnosis Iron deficiency anemia due to chronic blood loss - Plan: CBC with Differential, Comprehensive metabolic panel, Lactate dehydrogenase, Ferritin, Kappa/lambda light chains, IgG, IgA, IgM, Protein electrophoresis, serum  MGUS (monoclonal gammopathy of unknown significance) - Plan: CBC with Differential, Comprehensive metabolic panel, Lactate dehydrogenase, Ferritin, Kappa/lambda light chains, IgG, IgA, IgM, Protein electrophoresis, serum  Staging Cancer Staging No matching staging information was found for the patient.  Assessment and Plan:  1.   Iron deficiency anemia.  Felt secondary to chronic GI blood loss.  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 She had colonoscopy done 09/26/2016 that showed a 4 mm polyp with pathology returning as a tubular adenoma.    Pt was last treated with IV iron on 06/06/2016.    Labs done 06/15/2018 reviewed and showed WBC 5.2 HB 12.4 plts 190,000.  Chemistries WNL with K+ 3.8 Cr 1.14 and normal LFTs.  Ferritin 47.  Pt remains on oral iron and is tolerating therapy.  Pt will have repeat labs in 12/2018.   She should follow-up with GI as directed.     2.  IgG lambda MGUS.  SPEP done 06/15/2018 reviewed and showed stable  M spike of 0.8 g/dl.   Kappa/lambda ratio normal at 1.29.  IGG 09628  which is stable.  Skeletal survey on 09/23/16 showed no bone lesions.  She has no evidence of anemia, hypercalcemia or renal failure.  She will have repeat labs in 12/2018 for ongoing monitoring.   3.  HTN.  BP is 149/39.  Follow-up with PCP.    4.  DM.  Follow-up with PCP.    5.  Health maintenance.  Follow-up with GI.  Mammogram screening as recommended.    Current Status:  Pt is seen today for follow-up.  She is here to go over labs.    Problem List Patient Active Problem List   Diagnosis Date Noted  . Abdominal pain, epigastric [R10.13] 11/09/2017  . Chest pain [R07.9] 11/09/2017  . Large hiatal hernia [K44.9]  03/10/2017  . MGUS (monoclonal gammopathy of unknown significance) [D47.2] 10/06/2016  . History of colonic polyps [Z86.010] 08/19/2016  . Anemia [D64.9] 03/14/2016  . UGIB (upper gastrointestinal bleed) [K92.2] 02/14/2016  . Diabetes ([place]) [E11.9] 02/14/2016  . Essential hypertension [I10] 02/14/2016  . Hyperlipidemia [E78.5] 02/14/2016  . Acute bronchitis [J20.9] 02/14/2016  . GERD (gastroesophageal reflux disease) [K21.9] 07/14/2011  . Screen for colon cancer [Z12.11] 07/14/2011  . Dysphagia [787.2] 07/14/2011    Past Medical History Past Medical History:  Diagnosis Date  . Anemia 03/14/2016  . Arthritis   . Asthma   . Bradycardia   . DM (diabetes mellitus) ([place])   . GERD (gastroesophageal reflux disease)   . High cholesterol   . HTN (hypertension)     Past Surgical History Past Surgical History:  Procedure Laterality Date  . CHOLECYSTECTOMY  1990s  . COLONOSCOPY  03/18/1999   Dr Rourk-int  hemorrhoids, pancolonic diverticula  . COLONOSCOPY N/A 09/26/2016   Dr. Gala Romney: Diverticulosis, 4 mm polyp removed from the descending colon which is a tubular adenoma.  No future colonoscopies unless new symptoms arise.  Marland Kitchen COLONOSCOPY WITH ESOPHAGOGASTRODUODENOSCOPY (EGD)  08/06/2011   Rourk: Schatzki ring, moderate sized hiatal hernia status post dilation for history of dysphagia. Pancolonic diverticula, single diminutive polyp at the base of the cecum removed (tubular adenoma). Next colonoscopy recommended for April 2018  . ESOPHAGOGASTRODUODENOSCOPY  07/02/01   Dr Rourk-Schatzki's ring s/p 6F maloney dilation, otherwise normal/small  hiatal hernia/   Linear erosion and ulceration in the proximal stomach/  The ulcerated lesion in the proximal stomach  benign.This may be a [name] lesion secondary to trauma to the mucosa, straddling the  diaphragmatic hiatus in the presence of a hiatal hernia   . ESOPHAGOGASTRODUODENOSCOPY N/A 02/14/2016   Dr. Gala Romney: Large hiatal hernia, [name] lesion  likely explains bleeding.  Marland Kitchen MALONEY DILATION  08/06/2011   Procedure: Venia Minks DILATION;  Surgeon: Daneil Dolin, MD;  Location: [facility];  Service: Endoscopy;  Laterality: N/A;  . POLYPECTOMY  09/26/2016   Procedure: POLYPECTOMY;  Surgeon: Daneil Dolin, MD;  Location: [facility];  Service: Endoscopy;;  descending colon  . TUBAL LIGATION      Family History Family History  Problem Relation Age of Onset  . Aneurysm Daughter   . Stroke Daughter   . Diabetes Daughter   . Hypertension Mother   . Dementia Mother   . Hypertension Father   . Kidney cancer Brother   . Cancer Brother        brain  . Cancer Brother        bone  . Stroke Brother   . Hypertension Daughter   . Hypertension Son   . Hyperlipidemia Son   . Colon cancer Neg Hx   . Inflammatory bowel disease Neg Hx      Social History  reports that she has never smoked. She has never used smokeless tobacco. She reports that she does not drink alcohol or use drugs.  Medications  Current Outpatient Medications:  .  acetaminophen (TYLENOL) 500 MG tablet, Take 500 mg by mouth daily as needed for headache., Disp: , Rfl:  .  albuterol (PROAIR HFA) 108 (90 BASE) MCG/ACT inhaler, Inhale 2 puffs into the lungs every 6 (six) hours as needed for wheezing or shortness of breath., Disp: , Rfl:  .  amLODipine (NORVASC) 10 MG tablet, Take 1 tablet (10 mg total) by mouth daily., Disp: 90 tablet, Rfl: 3 .  Ascorbic Acid (VITAMIN C) 100 MG tablet, Take 100 mg by mouth daily., Disp: , Rfl:  .  aspirin 81 MG tablet, Take 81 mg by mouth once a week. , Disp: , Rfl:  .  hydrochlorothiazide (HYDRODIURIL) 25 MG tablet, Take 25 mg by mouth daily. , Disp: , Rfl:  .  lisinopril (PRINIVIL,ZESTRIL) 40 MG tablet, Take 40 mg by mouth daily. , Disp: , Rfl:  .  loratadine (CLARITIN) 10 MG tablet, Take 1 tablet (10 mg total) by mouth daily. (Patient taking differently: Take 10 mg by mouth daily as needed for allergies. ), Disp: 30 tablet, Rfl: 0 .   nitroGLYCERIN (NITROSTAT) 0.4 MG SL tablet, Place 1 tablet (0.4 mg total) under the tongue every 5 (five) minutes as needed for chest pain., Disp: 25 tablet, Rfl: 3 .  pantoprazole (PROTONIX) 40 MG tablet, Take 1 tablet (40 mg total) by mouth daily., Disp: 90 tablet, Rfl: 3 .  pravastatin (PRAVACHOL) 20 MG tablet, Take 20 mg by mouth at bedtime., Disp: , Rfl:  .  TRUE METRIX BLOOD GLUCOSE TEST test strip, , Disp: , Rfl:   Allergies Augmentin [amoxicillin-pot clavulanate]; Penicillin g; and Sulfa antibiotics  Review of Systems Review of Systems - Oncology ROS negative   Physical Exam  Vitals Wt Readings from Last 3 Encounters:  06/22/18 168 lb 8 oz (76.4 kg)  06/21/18 168 lb 12.8 oz (76.6 kg)  05/13/18 174 lb (78.9 kg)   Temp Readings from Last 3 Encounters:  06/22/18 97.8 F (36.6 C) (Oral)  05/13/18 98.6 F (37 C) (Oral)  02/16/18 (!) 97.5 F (36.4 C) (Oral)   BP Readings from Last 3 Encounters:  06/22/18 (!) 149/39  06/21/18 (!) 152/74  05/13/18 (!) 148/44   Pulse Readings from Last 3 Encounters:  06/22/18 61  06/21/18 66  05/13/18 72   Constitutional: Well-developed, well-nourished, and in no distress.   HENT: Head: Normocephalic and atraumatic.  Mouth/Throat: No oropharyngeal exudate. Mucosa moist. Eyes: Pupils are equal, round, and reactive to light. Conjunctivae are normal. No scleral icterus.  Neck: Normal range of motion. Neck supple. No JVD present.  Cardiovascular: Normal rate, regular rhythm and normal heart sounds.  Exam reveals no gallop and no friction rub.   No murmur heard. Pulmonary/Chest: Effort normal and breath sounds normal. No respiratory distress. No wheezes.No rales.  Abdominal: Soft. Bowel sounds are normal. No distension. There is no tenderness. There is no guarding.  Musculoskeletal: No edema or tenderness.  Lymphadenopathy: No cervical, axillary or supraclavicular adenopathy.  Neurological: Alert and oriented to person, place, and time. No  cranial nerve deficit.  Skin: Skin is warm and dry. No rash noted. No erythema. No pallor.  Psychiatric: Affect and judgment normal.   Labs No visits with results within 3 Day(s) from this visit.  Latest known visit with results is:  Appointment on 06/15/2018  Component Date Value Ref Range Status  . WBC 06/15/2018 5.2  4.0 - 10.5 K/uL Final  . RBC 06/15/2018 4.51  3.87 - 5.11 MIL/uL Final  . Hemoglobin 06/15/2018 12.4  12.0 - 15.0 g/dL Final  . HCT 06/15/2018 40.7  36.0 - 46.0 % Final  . MCV 06/15/2018 90.2  80.0 - 100.0 fL Final  . MCH 06/15/2018 27.5  26.0 - 34.0 pg Final  . MCHC 06/15/2018 30.5  30.0 - 36.0 g/dL Final  . RDW 06/15/2018 14.4  11.5 - 15.5 % Final  . Platelets 06/15/2018 190  150 - 400 K/uL Final  . nRBC 06/15/2018 0.0  0.0 - 0.2 % Final  . Neutrophils Relative % 06/15/2018 45  % Final  . Neutro Abs 06/15/2018 2.4  1.7 - 7.7 K/uL Final  . Lymphocytes Relative 06/15/2018 41  % Final  . Lymphs Abs 06/15/2018 2.1  0.7 - 4.0 K/uL Final  . Monocytes Relative 06/15/2018 10  % Final  . Monocytes Absolute 06/15/2018 0.5  0.1 - 1.0 K/uL Final  . Eosinophils Relative 06/15/2018 3  % Final  . Eosinophils Absolute 06/15/2018 0.1  0.0 - 0.5 K/uL Final  . Basophils Relative 06/15/2018 1  % Final  . Basophils Absolute 06/15/2018 0.0  0.0 - 0.1 K/uL Final  . Immature Granulocytes 06/15/2018 0  % Final  . Abs Immature Granulocytes 06/15/2018 0.01  0.00 - 0.07 K/uL Final   Performed at [facility], [address] [place], [place] [address]  . Sodium 06/15/2018 140  135 - 145 mmol/L Final  . Potassium 06/15/2018 3.8  3.5 - 5.1 mmol/L Final  . Chloride 06/15/2018 107  98 - 111 mmol/L Final  . CO2 06/15/2018 25  22 - 32 mmol/L Final  . Glucose, Bld 06/15/2018 106* 70 - 99 mg/dL Final  . BUN 06/15/2018 20  8 - 23 mg/dL Final  . Creatinine, Ser 06/15/2018 1.14* 0.44 - 1.00 mg/dL Final  . Calcium 06/15/2018 8.9  8.9 - 10.3 mg/dL Final  . Total Protein 06/15/2018 7.3  6.5 - 8.1 g/dL  Final  . Albumin 06/15/2018 3.8  3.5 -  5.0 g/dL Final  . AST 06/15/2018 25  15 - 41 U/L Final  . ALT 06/15/2018 22  0 - 44 U/L Final  . Alkaline Phosphatase 06/15/2018 45  38 - 126 U/L Final  . Total Bilirubin 06/15/2018 0.4  0.3 - 1.2 mg/dL Final  . GFR calc non Af Amer 06/15/2018 46* >60 mL/min Final  . GFR calc Af Amer 06/15/2018 53* >60 mL/min Final  . Anion gap 06/15/2018 8  5 - 15 Final   Performed at [facility], [address], [place], [place] [address]  . LDH 06/15/2018 121  98 - 192 U/L Final   Performed at [facility], [address] [place], [place] [address]  . Ferritin 06/15/2018 47  11 - 307 ng/mL Final   Performed at [facility], [address] [place], [place] [address]  . Total Protein ELP 06/15/2018 7.0  6.0 - 8.5 g/dL Final  . Albumin ELP 06/15/2018 3.8  2.9 - 4.4 g/dL Final  . Alpha-1-Globulin 06/15/2018 0.2  0.0 - 0.4 g/dL Final  . Alpha-2-Globulin 06/15/2018 0.8  0.4 - 1.0 g/dL Final  . Beta Globulin 06/15/2018 0.9  0.7 - 1.3 g/dL Final  . Gamma Globulin 06/15/2018 1.4  0.4 - 1.8 g/dL Final  . M-Spike, % 06/15/2018 0.8* Not Observed g/dL Final  . SPE Interp. 06/15/2018 Comment   Final   Comment: (NOTE) The SPE pattern demonstrates a single peak (M-spike) in the gamma region which may represent monoclonal protein. This peak may also be caused by circulating immune complexes, cryoglobulins, C-reactive protein, fibrinogen or hemolysis.  If clinically indicated, the presence of a monoclonal gammopathy may be confirmed by immuno- fixation, as well as an evaluation of the urine for the presence of Bence-Jones protein. Performed At: [facility] [place], [place] [id number] Rush Farmer MD [id number]   . Comment 06/15/2018 Comment   Final   Comment: (NOTE) Protein electrophoresis scan will follow via computer, mail, or courier delivery.   . Globulin, Total 06/15/2018 3.2  2.2 - 3.9 g/dL Corrected  . A/G Ratio 06/15/2018 1.2  0.7 -  1.7 Corrected  . IgG (Immunoglobin G), Serum 06/15/2018 1,545  700 - 1,600 mg/dL Final  . IgA 06/15/2018 116  64 - 422 mg/dL Final  . IgM (Immunoglobulin M), Srm 06/15/2018 60  26 - 217 mg/dL Final   Comment: (NOTE) Performed At: [facility] [place], [place] [id number] Rush Farmer MD [id number]   . Kappa free light chain 06/15/2018 21.8* 3.3 - 19.4 mg/L Final  . Lamda free light chains 06/15/2018 16.9  5.7 - 26.3 mg/L Final  . Kappa, lamda light chain ratio 06/15/2018 1.29  0.26 - 1.65 Final   Comment: (NOTE) Performed At: [facility] [place], [place] [id number] Rush Farmer MD [id number]   . Beta-2 Microglobulin 06/15/2018 1.4  0.6 - 2.4 mg/L Final   Comment: (NOTE) Siemens Immulite 2000 Immunochemiluminometric assay (ICMA) Values obtained with different assay methods or kits cannot be used interchangeably. Results cannot be interpreted as absolute evidence of the presence or absence of malignant disease. Performed At: [facility] [place], [place] [id number] Rush Farmer MD [id number]      Pathology Orders Placed This Encounter  Procedures  . CBC with Differential    Standing Status:   Future    Standing Expiration Date:   06/22/2019  . Comprehensive metabolic panel    Standing Status:   Future    Standing  Expiration Date:   06/22/2019  . Lactate dehydrogenase    Standing Status:   Future    Standing Expiration Date:   06/22/2019  . Ferritin    Standing Status:   Future    Standing Expiration Date:   06/22/2019  . Kappa/lambda light chains    Standing Status:   Future    Standing Expiration Date:   06/22/2019  . IgG, IgA, IgM    Standing Status:   Future    Standing Expiration Date:   06/22/2019  . Protein electrophoresis, serum    Standing Status:   Future    Standing Expiration Date:   06/22/2019       Zoila Shutter MD

## 2018-08-17 ENCOUNTER — Ambulatory Visit (INDEPENDENT_AMBULATORY_CARE_PROVIDER_SITE_OTHER): Payer: Medicare HMO | Admitting: Gastroenterology

## 2018-08-17 ENCOUNTER — Encounter: Payer: Self-pay | Admitting: Gastroenterology

## 2018-08-17 ENCOUNTER — Other Ambulatory Visit: Payer: Self-pay

## 2018-08-17 DIAGNOSIS — K449 Diaphragmatic hernia without obstruction or gangrene: Secondary | ICD-10-CM

## 2018-08-17 DIAGNOSIS — K219 Gastro-esophageal reflux disease without esophagitis: Secondary | ICD-10-CM

## 2018-08-17 NOTE — Progress Notes (Signed)
Primary Care Physician:  [facility]:  [name], MD   Patient Location: Home  Provider Location: [place] office  Reason for Phone Visit: IDA, GERD  Persons present on the phone encounter, with roles: Patient, myself (provider),Stacey Riley, CMA (updated meds and allergies)  Total time (minutes) spent on medical discussion: 10 minutes  Due to COVID-19, visit was conducted using telephonic method (no video was available).  Visit was requested by patient.  Virtual Visit via Telephone only  I connected with Stacey Riley on 08/17/18 at  9:00 AM EDT by telephone and verified that I am speaking with the correct person using two identifiers.   I discussed the limitations, risks, security and privacy concerns of performing an evaluation and management service by telephone and the availability of in person appointments. I also discussed with the patient that there may be a patient responsible charge related to this service. The patient expressed understanding and agreed to proceed.  Chief Complaint  Patient presents with  . ida    f/u.  Marland Kitchen Gastroesophageal Reflux    f/u. Occas issue but ok for most part     HPI:   Patient is a pleasant 78 y/o female who presents for telephone visit regarding IDA, GERD, large hiatal hernia.  Patient was last seen in November.  He has a history of large hiatal hernia, history of chronic anemia requiring IV iron infusions, followed by hematology.  She had [name] lesions noted on prior EGD in 2017 when she presented with upper GI bleeding.  She is felt to have 50% of her stomach in the inferior mediastinum on previous imaging.  She is required PPI twice daily to control her symptoms.  Her last colonoscopy was in June 2018, tubular adenoma removed.  She last saw hematology in March.  Her last iron infusion was in February 2018.  Weight is stable. Feb 169.8 lb. Dropped a few pounds when sick in 05/2018.  She was sick  for 2 weeks with an upper respiratory infection.  Overall she is doing well now.  Unless she eats something she should and she does pretty well with regards to her reflux.  She is back down on pantoprazole once daily now.  Denies any dysphagia.  No abdominal pain.  Bowel movements are regular.  No blood in the stool or melena.   Current Outpatient Medications  Medication Sig Dispense Refill  . acetaminophen (TYLENOL) 500 MG tablet Take 500 mg by mouth daily as needed for headache.    . albuterol (PROAIR HFA) 108 (90 BASE) MCG/ACT inhaler Inhale 2 puffs into the lungs every 6 (six) hours as needed for wheezing or shortness of breath.    Marland Kitchen amLODipine (NORVASC) 10 MG tablet Take 1 tablet (10 mg total) by mouth daily. 90 tablet 3  . Ascorbic Acid (VITAMIN C) 100 MG tablet Take 100 mg by mouth daily.    Marland Kitchen aspirin 81 MG tablet Take 81 mg by mouth once a week.     . hydrochlorothiazide (HYDRODIURIL) 25 MG tablet Take 25 mg by mouth daily.     Marland Kitchen lisinopril (PRINIVIL,ZESTRIL) 40 MG tablet Take 40 mg by mouth daily.     Marland Kitchen loratadine (CLARITIN) 10 MG tablet Take 1 tablet (10 mg total) by mouth daily. (Patient taking differently: Take 10 mg by mouth daily as needed for allergies. ) 30 tablet 0  . nitroGLYCERIN (NITROSTAT) 0.4 MG SL tablet Place 1 tablet (0.4 mg total)  under the tongue every 5 (five) minutes as needed for chest pain. 25 tablet 3  . pantoprazole (PROTONIX) 40 MG tablet Take 1 tablet (40 mg total) by mouth daily. 90 tablet 3  . pravastatin (PRAVACHOL) 20 MG tablet Take 20 mg by mouth at bedtime.    . TRUE METRIX BLOOD GLUCOSE TEST test strip      No current facility-administered medications for this visit.     ROS:  General: Negative for anorexia, weight loss, fever, chills, fatigue, weakness. Eyes: Negative for vision changes.  ENT: Negative for hoarseness, difficulty swallowing , nasal congestion. CV: Negative for chest pain, angina, palpitations, dyspnea on exertion, peripheral edema.   Respiratory: Negative for dyspnea at rest, dyspnea on exertion, cough, sputum, wheezing.  GI: See history of present illness. GU:  Negative for dysuria, hematuria, urinary incontinence, urinary frequency, nocturnal urination.  MS: Negative for joint pain, low back pain.  Derm: Negative for rash or itching.  Neuro: Negative for weakness, abnormal sensation, seizure, frequent headaches, memory loss, confusion.  Psych: Negative for anxiety, depression, suicidal ideation, hallucinations.  Endo: Negative for unusual weight change.  Heme: Negative for bruising or bleeding. Allergy: Negative for rash or hives.   Observations/Objective: Pleasant cooperative female, no acute distress.  Otherwise exam unavailable. Lab Results  Component Value Date   CREATININE 1.14 (H) 06/15/2018   BUN 20 06/15/2018   NA 140 06/15/2018   K 3.8 06/15/2018   CL 107 06/15/2018   CO2 25 06/15/2018   Lab Results  Component Value Date   ALT 22 06/15/2018   AST 25 06/15/2018   ALKPHOS 45 06/15/2018   BILITOT 0.4 06/15/2018   Lab Results  Component Value Date   WBC 5.2 06/15/2018   HGB 12.4 06/15/2018   HCT 40.7 06/15/2018   MCV 90.2 06/15/2018   PLT 190 06/15/2018   Lab Results  Component Value Date   IRON 53 12/07/2017   TIBC 361 12/07/2017   FERRITIN 47 06/15/2018    Assessment and Plan: Pleasant 78 year old female with history of large hiatal hernia, GERD, IDA felt to be due to chronic occult GI bleeding.  Clinically she is doing well.  Encouraged her to continue taking pantoprazole daily.  She is at risk of developing Cameron lesions, worsening IDA.  As long as she watches her diet, she has very little reflux.  Denies any abdominal pain.  Last iron infusion in February 2018.  She will continue to follow with hematology as scheduled in the fall.  We will have her come back here in 6 months for follow-up.  She can call sooner if any problems.  Follow Up Instructions:    I discussed the  assessment and treatment plan with the patient. The patient was provided an opportunity to ask questions and all were answered. The patient agreed with the plan and demonstrated an understanding of the instructions. AVS mailed to patient's home address.   The patient was advised to call back or seek an in-person evaluation if the symptoms worsen or if the condition fails to improve as anticipated.  I provided 10 minutes of non-face-to-face time during this encounter.   Neil Crouch, PA-C

## 2018-08-17 NOTE — Patient Instructions (Addendum)
1. Continue taking pantoprazole once daily before breakfast.  Because you have a large hiatal hernia, you are at risk for developing reflux, erosions and this can contribute to your anemia. 2. We will see you back in 6 months.  Please call sooner if needed.   Hiatal Hernia  A hiatal hernia occurs when part of the stomach slides above the muscle that separates the abdomen from the chest (diaphragm). A person can be born with a hiatal hernia (congenital), or it may develop over time. In almost all cases of hiatal hernia, only the top part of the stomach pushes through the diaphragm. Many people have a hiatal hernia with no symptoms. The larger the hernia, the more likely it is that you will have symptoms. In some cases, a hiatal hernia allows stomach acid to flow back into the tube that carries food from your mouth to your stomach (esophagus). This may cause heartburn symptoms. Severe heartburn symptoms may mean that you have developed a condition called gastroesophageal reflux disease (GERD). What are the causes? This condition is caused by a weakness in the opening (hiatus) where the esophagus passes through the diaphragm to attach to the upper part of the stomach. A person may be born with a weakness in the hiatus, or a weakness can develop over time. What increases the risk? This condition is more likely to develop in:  Older people. Age is a major risk factor for a hiatal hernia, especially if you are over the age of 36.  Pregnant women.  People who are overweight.  People who have frequent constipation. What are the signs or symptoms? Symptoms of this condition usually develop in the form of GERD symptoms. Symptoms include:  Heartburn.  Belching.  Indigestion.  Trouble swallowing.  Coughing or wheezing.  Sore throat.  Hoarseness.  Chest pain.  Nausea and vomiting. How is this diagnosed? This condition may be diagnosed during testing for GERD. Tests that may be done  include:  X-rays of your stomach or chest.  An upper gastrointestinal (GI) series. This is an X-ray exam of your GI tract that is taken after you swallow a chalky liquid that shows up clearly on the X-ray.  Endoscopy. This is a procedure to look into your stomach using a thin, flexible tube that has a tiny camera and light on the end of it. How is this treated? This condition may be treated by:  Dietary and lifestyle changes to help reduce GERD symptoms.  Medicines. These may include: ? Over-the-counter antacids. ? Medicines that make your stomach empty more quickly. ? Medicines that block the production of stomach acid (H2 blockers). ? Stronger medicines to reduce stomach acid (proton pump inhibitors).  Surgery to repair the hernia, if other treatments are not helping. If you have no symptoms, you may not need treatment. Follow these instructions at home: Lifestyle and activity  Do not use any products that contain nicotine or tobacco, such as cigarettes and e-cigarettes. If you need help quitting, ask your health care provider.  Try to achieve and maintain a healthy body weight.  Avoid putting pressure on your abdomen. Anything that puts pressure on your abdomen increases the amount of acid that may be pushed up into your esophagus. ? Avoid bending over, especially after eating. ? Raise the head of your bed by putting blocks under the legs. This keeps your head and esophagus higher than your stomach. ? Do not wear tight clothing around your chest or stomach. ? Try not to strain  when having a bowel movement, when urinating, or when lifting heavy objects. Eating and drinking  Avoid foods that can worsen GERD symptoms. These may include: ? Fatty foods, like fried foods. ? Citrus fruits, like oranges or lemon. ? Other foods and drinks that contain acid, like orange juice or tomatoes. ? Spicy food. ? Chocolate.  Eat frequent small meals instead of three large meals a day. This  helps prevent your stomach from getting too full. ? Eat slowly. ? Do not lie down right after eating. ? Do not eat 1-2 hours before bed.  Do not drink beverages with caffeine. These include cola, coffee, cocoa, and tea.  Do not drink alcohol. General instructions  Take over-the-counter and prescription medicines only as told by your health care provider.  Keep all follow-up visits as told by your health care provider. This is important. Contact a health care provider if:  Your symptoms are not controlled with medicines or lifestyle changes.  You are having trouble swallowing.  You have coughing or wheezing that will not go away. Get help right away if:  Your pain is getting worse.  Your pain spreads to your arms, neck, jaw, teeth, or back.  You have shortness of breath.  You sweat for no reason.  You feel sick to your stomach (nauseous) or you vomit.  You vomit blood.  You have bright red blood in your stools.  You have black, tarry stools. This information is not intended to replace advice given to you by your health care provider. Make sure you discuss any questions you have with your health care provider. Document Released: 06/21/2003 Document Revised: 11/03/2016 Document Reviewed: 11/03/2016 Elsevier Interactive Patient Education  2019 [employer].

## 2018-08-17 NOTE — Progress Notes (Signed)
CC'D TO PCP °

## 2018-12-21 ENCOUNTER — Other Ambulatory Visit (HOSPITAL_COMMUNITY): Payer: Self-pay | Admitting: Nurse Practitioner

## 2018-12-21 ENCOUNTER — Inpatient Hospital Stay (HOSPITAL_COMMUNITY): Payer: Medicare HMO | Attending: Hematology

## 2018-12-21 ENCOUNTER — Encounter (HOSPITAL_COMMUNITY): Payer: Self-pay

## 2018-12-21 ENCOUNTER — Other Ambulatory Visit: Payer: Self-pay

## 2018-12-21 DIAGNOSIS — E119 Type 2 diabetes mellitus without complications: Secondary | ICD-10-CM | POA: Diagnosis not present

## 2018-12-21 DIAGNOSIS — Z79899 Other long term (current) drug therapy: Secondary | ICD-10-CM | POA: Diagnosis not present

## 2018-12-21 DIAGNOSIS — Z23 Encounter for immunization: Secondary | ICD-10-CM | POA: Insufficient documentation

## 2018-12-21 DIAGNOSIS — D472 Monoclonal gammopathy: Secondary | ICD-10-CM | POA: Insufficient documentation

## 2018-12-21 DIAGNOSIS — E7800 Pure hypercholesterolemia, unspecified: Secondary | ICD-10-CM | POA: Insufficient documentation

## 2018-12-21 DIAGNOSIS — Z7982 Long term (current) use of aspirin: Secondary | ICD-10-CM | POA: Insufficient documentation

## 2018-12-21 DIAGNOSIS — J45909 Unspecified asthma, uncomplicated: Secondary | ICD-10-CM | POA: Diagnosis not present

## 2018-12-21 DIAGNOSIS — I10 Essential (primary) hypertension: Secondary | ICD-10-CM | POA: Insufficient documentation

## 2018-12-21 DIAGNOSIS — D509 Iron deficiency anemia, unspecified: Secondary | ICD-10-CM | POA: Diagnosis not present

## 2018-12-21 LAB — COMPREHENSIVE METABOLIC PANEL
ALT: 29 U/L (ref 0–44)
AST: 29 U/L (ref 15–41)
Albumin: 4 g/dL (ref 3.5–5.0)
Alkaline Phosphatase: 55 U/L (ref 38–126)
Anion gap: 10 (ref 5–15)
BUN: 21 mg/dL (ref 8–23)
CO2: 28 mmol/L (ref 22–32)
Calcium: 9.2 mg/dL (ref 8.9–10.3)
Chloride: 102 mmol/L (ref 98–111)
Creatinine, Ser: 1.1 mg/dL — ABNORMAL HIGH (ref 0.44–1.00)
GFR calc Af Amer: 55 mL/min — ABNORMAL LOW (ref >60)
GFR calc non Af Amer: 48 mL/min — ABNORMAL LOW (ref >60)
Glucose, Bld: 109 mg/dL — ABNORMAL HIGH (ref 70–99)
Potassium: 3.7 mmol/L (ref 3.5–5.1)
Sodium: 140 mmol/L (ref 135–145)
Total Bilirubin: 0.5 mg/dL (ref 0.3–1.2)
Total Protein: 7.8 g/dL (ref 6.5–8.1)

## 2018-12-21 LAB — CBC WITH DIFFERENTIAL/PLATELET
Abs Immature Granulocytes: 0.01 10*3/uL (ref 0.00–0.07)
Basophils Absolute: 0 10*3/uL (ref 0.0–0.1)
Basophils Relative: 0 %
Eosinophils Absolute: 0.1 10*3/uL (ref 0.0–0.5)
Eosinophils Relative: 1 %
HCT: 41.7 % (ref 36.0–46.0)
Hemoglobin: 13 g/dL (ref 12.0–15.0)
Immature Granulocytes: 0 %
Lymphocytes Relative: 40 %
Lymphs Abs: 2.2 10*3/uL (ref 0.7–4.0)
MCH: 28.1 pg (ref 26.0–34.0)
MCHC: 31.2 g/dL (ref 30.0–36.0)
MCV: 90.1 fL (ref 80.0–100.0)
Monocytes Absolute: 0.5 10*3/uL (ref 0.1–1.0)
Monocytes Relative: 10 %
Neutro Abs: 2.7 10*3/uL (ref 1.7–7.7)
Neutrophils Relative %: 49 %
Platelets: 185 10*3/uL (ref 150–400)
RBC: 4.63 MIL/uL (ref 3.87–5.11)
RDW: 14.2 % (ref 11.5–15.5)
WBC: 5.5 10*3/uL (ref 4.0–10.5)
nRBC: 0 % (ref 0.0–0.2)

## 2018-12-21 LAB — IRON AND TIBC
Iron: 50 ug/dL (ref 28–170)
Saturation Ratios: 14 % (ref 10.4–31.8)
TIBC: 356 ug/dL (ref 250–450)
UIBC: 306 ug/dL

## 2018-12-21 LAB — FOLATE: Folate: 32.7 ng/mL (ref >5.9)

## 2018-12-21 LAB — VITAMIN B12: Vitamin B-12: 279 pg/mL (ref 180–914)

## 2018-12-21 LAB — FERRITIN: Ferritin: 30 ng/mL (ref 11–307)

## 2018-12-21 LAB — LACTATE DEHYDROGENASE: LDH: 139 U/L (ref 98–192)

## 2018-12-22 LAB — PROTEIN ELECTROPHORESIS, SERUM
A/G Ratio: 1.1 (ref 0.7–1.7)
Albumin ELP: 3.9 g/dL (ref 2.9–4.4)
Alpha-1-Globulin: 0.2 g/dL (ref 0.0–0.4)
Alpha-2-Globulin: 0.9 g/dL (ref 0.4–1.0)
Beta Globulin: 0.9 g/dL (ref 0.7–1.3)
Gamma Globulin: 1.4 g/dL (ref 0.4–1.8)
Globulin, Total: 3.4 g/dL (ref 2.2–3.9)
M-Spike, %: 0.6 g/dL — ABNORMAL HIGH
Total Protein ELP: 7.3 g/dL (ref 6.0–8.5)

## 2018-12-22 LAB — IGG, IGA, IGM
IgA: 115 mg/dL (ref 64–422)
IgG (Immunoglobin G), Serum: 1593 mg/dL (ref 586–1602)
IgM (Immunoglobulin M), Srm: 58 mg/dL (ref 26–217)

## 2018-12-22 LAB — VITAMIN D 25 HYDROXY (VIT D DEFICIENCY, FRACTURES): Vit D, 25-Hydroxy: 31 ng/mL (ref 30.0–100.0)

## 2018-12-22 LAB — KAPPA/LAMBDA LIGHT CHAINS
Kappa free light chain: 27.9 mg/L — ABNORMAL HIGH (ref 3.3–19.4)
Kappa, lambda light chain ratio: 1.4 (ref 0.26–1.65)
Lambda free light chains: 20 mg/L (ref 5.7–26.3)

## 2018-12-28 ENCOUNTER — Inpatient Hospital Stay (HOSPITAL_COMMUNITY): Payer: Medicare HMO | Admitting: Nurse Practitioner

## 2018-12-28 ENCOUNTER — Encounter (HOSPITAL_COMMUNITY): Payer: Self-pay | Admitting: Nurse Practitioner

## 2018-12-28 ENCOUNTER — Other Ambulatory Visit: Payer: Self-pay

## 2018-12-28 ENCOUNTER — Ambulatory Visit (HOSPITAL_COMMUNITY): Payer: Medicare HMO | Admitting: Nurse Practitioner

## 2018-12-28 VITALS — BP 147/46 | HR 53 | Temp 97.7°F | Resp 18 | Wt 172.9 lb

## 2018-12-28 DIAGNOSIS — Z0000 Encounter for general adult medical examination without abnormal findings: Secondary | ICD-10-CM

## 2018-12-28 DIAGNOSIS — D472 Monoclonal gammopathy: Secondary | ICD-10-CM | POA: Diagnosis not present

## 2018-12-28 MED ORDER — INFLUENZA VAC A&B SA ADJ QUAD 0.5 ML IM PRSY
0.5000 mL | PREFILLED_SYRINGE | INTRAMUSCULAR | Status: AC
  Administered 2018-12-28: 0.5 mL via INTRAMUSCULAR
  Filled 2018-12-28: qty 0.5

## 2018-12-28 NOTE — Progress Notes (Signed)
Stacey Riley, Stacey Riley [id number]   CLINIC:  Medical Oncology/Hematology  PCP:  [facility] [place] [address] [phone]   REASON FOR VISIT: Follow-up for MGUS and anemia  CURRENT THERAPY: observation   INTERVAL HISTORY:  Stacey Riley 78 y.o. female returns for routine follow-up for MGUS and anemia. Patient reports she is feeling great since her last visit. She has no complaints at this time. Denies any nausea, vomiting, or diarrhea. Denies any new pains. Had not noticed any recent bleeding such as epistaxis, hematuria or hematochezia. Denies recent chest pain on exertion, shortness of breath on minimal exertion, pre-syncopal episodes, or palpitations. Denies any numbness or tingling in hands or feet. Denies any recent fevers, infections, or recent hospitalizations. Patient reports appetite at 100% and energy level at 100%. She is eating well and maintaining her weight at this time.     REVIEW OF SYSTEMS:  Review of Systems  All other systems reviewed and are negative.    PAST MEDICAL/SURGICAL HISTORY:  Past Medical History:  Diagnosis Date  . Anemia 03/14/2016  . Arthritis   . Asthma   . Bradycardia   . DM (diabetes mellitus) ([place])   . GERD (gastroesophageal reflux disease)   . High cholesterol   . HTN (hypertension)    Past Surgical History:  Procedure Laterality Date  . CHOLECYSTECTOMY  1990s  . COLONOSCOPY  03/18/1999   Dr Rourk-int  hemorrhoids, pancolonic diverticula  . COLONOSCOPY N/A 09/26/2016   Dr. Gala Romney: Diverticulosis, 4 mm polyp removed from the descending colon which is a tubular adenoma.  No future colonoscopies unless new symptoms arise.  Marland Kitchen COLONOSCOPY WITH ESOPHAGOGASTRODUODENOSCOPY (EGD)  08/06/2011   Rourk: Schatzki ring, moderate sized hiatal hernia status post dilation for history of dysphagia. Pancolonic diverticula, single diminutive polyp at the base of the cecum removed  (tubular adenoma). Next colonoscopy recommended for April 2018  . ESOPHAGOGASTRODUODENOSCOPY  07/02/01   Dr Rourk-Schatzki's ring s/p 71F maloney dilation, otherwise normal/small hiatal hernia/   Linear erosion and ulceration in the proximal stomach/  The ulcerated lesion in the proximal stomach  benign.This may be a [name] lesion secondary to trauma to the mucosa, straddling the  diaphragmatic hiatus in the presence of a hiatal hernia   . ESOPHAGOGASTRODUODENOSCOPY N/A 02/14/2016   Dr. Gala Romney: Large hiatal hernia, [name] lesion likely explains bleeding.  Marland Kitchen MALONEY DILATION  08/06/2011   Procedure: Venia Minks DILATION;  Surgeon: Daneil Dolin, MD;  Location: [facility];  Service: Endoscopy;  Laterality: N/A;  . POLYPECTOMY  09/26/2016   Procedure: POLYPECTOMY;  Surgeon: Daneil Dolin, MD;  Location: [facility];  Service: Endoscopy;;  descending colon  . TUBAL LIGATION       SOCIAL HISTORY:  Social History   Socioeconomic History  . Marital status: Divorced    Spouse name: Not on file  . Number of children: 5  . Years of education: Not on file  . Highest education level: Not on file  Occupational History  . Occupation: retired; Copy  . Financial resource strain: Not on file  . Food insecurity    Worry: Not on file    Inability: Not on file  . Transportation needs    Medical: Not on file    Non-medical: Not on file  Tobacco Use  . Smoking status: Never Smoker  . Smokeless tobacco: Never Used  Substance and Sexual Activity  . Alcohol use:  No  . Drug use: No  . Sexual activity: Not on file  Lifestyle  . Physical activity    Days per week: Not on file    Minutes per session: Not on file  . Stress: Not on file  Relationships  . Social Herbalist on phone: Not on file    Gets together: Not on file    Attends religious service: Not on file    Active member of club or organization: Not on file    Attends meetings of clubs or organizations: Not  on file    Relationship status: Not on file  . Intimate partner violence    Fear of current or ex partner: Not on file    Emotionally abused: Not on file    Physically abused: Not on file    Forced sexual activity: Not on file  Other Topics Concern  . Not on file  Social History Narrative   Lives alone    FAMILY HISTORY:  Family History  Problem Relation Age of Onset  . Aneurysm Daughter   . Stroke Daughter   . Diabetes Daughter   . Hypertension Mother   . Dementia Mother   . Hypertension Father   . Kidney cancer Brother   . Cancer Brother        brain  . Cancer Brother        bone  . Stroke Brother   . Hypertension Daughter   . Hypertension Son   . Hyperlipidemia Son   . Colon cancer Neg Hx   . Inflammatory bowel disease Neg Hx     CURRENT MEDICATIONS:  Outpatient Encounter Medications as of 12/28/2018  Medication Sig  . amLODipine (NORVASC) 10 MG tablet Take 1 tablet (10 mg total) by mouth daily.  . Ascorbic Acid (VITAMIN C) 100 MG tablet Take 100 mg by mouth daily.  Marland Kitchen aspirin 81 MG tablet Take 81 mg by mouth once a week.   . hydrochlorothiazide (HYDRODIURIL) 25 MG tablet Take 25 mg by mouth daily.   Marland Kitchen lisinopril (PRINIVIL,ZESTRIL) 40 MG tablet Take 40 mg by mouth daily.   Marland Kitchen loratadine (CLARITIN) 10 MG tablet Take 1 tablet (10 mg total) by mouth daily. (Patient taking differently: Take 10 mg by mouth daily as needed for allergies. )  . pantoprazole (PROTONIX) 40 MG tablet Take 1 tablet (40 mg total) by mouth daily.  . pravastatin (PRAVACHOL) 20 MG tablet Take 20 mg by mouth at bedtime.  . TRUE METRIX BLOOD GLUCOSE TEST test strip   . acetaminophen (TYLENOL) 500 MG tablet Take 500 mg by mouth daily as needed for headache.  . albuterol (PROAIR HFA) 108 (90 BASE) MCG/ACT inhaler Inhale 2 puffs into the lungs every 6 (six) hours as needed for wheezing or shortness of breath.  . Alcohol Swabs (B-D SINGLE USE SWABS REGULAR) PADS   . Blood Glucose Calibration (TRUE METRIX  LEVEL 1) Low SOLN   . Blood Glucose Monitoring Suppl (TRUE METRIX AIR GLUCOSE METER) w/Device KIT   . nitroGLYCERIN (NITROSTAT) 0.4 MG SL tablet Place 1 tablet (0.4 mg total) under the tongue every 5 (five) minutes as needed for chest pain. (Patient not taking: Reported on 12/28/2018)  . [EXPIRED] influenza vaccine adjuvanted (FLUAD) injection 0.5 mL    No facility-administered encounter medications on file as of 12/28/2018.     ALLERGIES:  Allergies  Allergen Reactions  . Augmentin [Amoxicillin-Pot Clavulanate] Nausea And Vomiting    Hematemesis Has patient had a PCN reaction  causing immediate rash, facial/tongue/throat swelling, SOB or lightheadedness with hypotension: Unknown Has patient had a PCN reaction causing severe rash involving mucus membranes or skin necrosis: Unknown Has patient had a PCN reaction that required hospitalization: Yes Has patient had a PCN reaction occurring within the last 10 years: Yes If all of the above answers are "NO", then may proceed with Cephalosporin use.   . Clavulanic Acid Nausea And Vomiting  . Penicillin G Nausea And Vomiting    .Did it involve swelling of the face/tongue/throat, SOB, or low BP? No Did it involve sudden or severe rash/hives, skin peeling, or any reaction on the inside of your mouth or nose? Unknown Did you need to seek medical attention at a hospital or doctor's office? yes When did it last happen?Unknown If all above answers are "NO", may proceed with cephalosporin use.   . Sulfa Antibiotics Swelling     PHYSICAL EXAM:  ECOG Performance status: 1  Vitals:   12/28/18 1312  BP: (!) 147/46  Pulse: (!) 53  Resp: 18  Temp: 97.7 F (36.5 C)  SpO2: 94%   Filed Weights   12/28/18 1312  Weight: 172 lb 14.4 oz (78.4 kg)    Physical Exam Constitutional:      Appearance: Normal appearance. She is normal weight.  Cardiovascular:     Rate and Rhythm: Normal rate and regular rhythm.     Heart sounds: Normal heart  sounds.  Pulmonary:     Effort: Pulmonary effort is normal.     Breath sounds: Normal breath sounds.  Abdominal:     General: Bowel sounds are normal.     Palpations: Abdomen is soft.  Musculoskeletal: Normal range of motion.  Skin:    General: Skin is warm and dry.  Neurological:     Mental Status: She is alert and oriented to person, place, and time. Mental status is at baseline.  Psychiatric:        Mood and Affect: Mood normal.        Behavior: Behavior normal.        Thought Content: Thought content normal.        Judgment: Judgment normal.      LABORATORY DATA:  I have reviewed the labs as listed.  CBC    Component Value Date/Time   WBC 5.5 12/21/2018 1117   RBC 4.63 12/21/2018 1117   HGB 13.0 12/21/2018 1117   HCT 41.7 12/21/2018 1117   PLT 185 12/21/2018 1117   MCV 90.1 12/21/2018 1117   MCH 28.1 12/21/2018 1117   MCHC 31.2 12/21/2018 1117   RDW 14.2 12/21/2018 1117   LYMPHSABS 2.2 12/21/2018 1117   MONOABS 0.5 12/21/2018 1117   EOSABS 0.1 12/21/2018 1117   BASOSABS 0.0 12/21/2018 1117   CMP Latest Ref Rng & Units 12/21/2018 06/15/2018 05/13/2018  Glucose 70 - 99 mg/dL 109(H) 106(H) 155(H)  BUN 8 - 23 mg/dL 21 20 17   Creatinine 0.44 - 1.00 mg/dL 1.10(H) 1.14(H) 1.08(H)  Sodium 135 - 145 mmol/L 140 140 135  Potassium 3.5 - 5.1 mmol/L 3.7 3.8 3.6  Chloride 98 - 111 mmol/L 102 107 100  CO2 22 - 32 mmol/L 28 25 24   Calcium 8.9 - 10.3 mg/dL 9.2 8.9 9.1  Total Protein 6.5 - 8.1 g/dL 7.8 7.3 -  Total Bilirubin 0.3 - 1.2 mg/dL 0.5 0.4 -  Alkaline Phos 38 - 126 U/L 55 45 -  AST 15 - 41 U/L 29 25 -  ALT 0 - 44 U/L  29 22 -     I personally performed a face-to-face visit.  All questions were answered to patient's stated satisfaction. Encouraged patient to call with any new concerns or questions before his next visit to the cancer center and we can certain see him sooner, if needed.    ASSESSMENT & PLAN:   MGUS (monoclonal gammopathy of unknown significance) 1.  Iron deficiency anemia: -It is felt secondary to chronic GI blood loss. - EGD 02/2016 showed [name] lesions in the stomach, which was possible source of bleeding. -Colonoscopy done on 09/26/2016 that showed a 4 mm polyp with pathology returned as tubular adenoma. -Patient is unable to tolerate oral iron therapy. -Last treated with IV iron 06/06/2016. - Labs done on 12/21/2018 WBC 5.5, hemoglobin 13.0, Platelets 185, ferritin 30, percent saturation 14 -We will see him back in 4 months with repeat labs.  2.IgG Lambda MGUS: - SPEP done on 9/8/2020showed stable M spike of 0.6 g/dL. - Kappa/lambda ratio normal at 1.40.  IgG 1593 which is stable. -Skeletal survey done on 09/23/2016 showed no bone lesions. -She has no evidence of anemia, hypercalcemia, or renal failure. -We will also set her up with another skeletal survey at her next visit. - We will see her back and check labs in 4 months.      Orders placed this encounter:  Orders Placed This Encounter  Procedures  . DG Bone Survey Met  . Lactate dehydrogenase  . Protein electrophoresis, serum  . Kappa/lambda light chains  . CBC with Differential/Platelet  . Comprehensive metabolic panel  . Ferritin  . Iron and TIBC  . Vitamin B12  . VITAMIN D 25 Hydroxy (Vit-D Deficiency, Fractures)  . Folate  . IgG, IgA, IgM     Francene Finders, FNP-C [place] [phone]

## 2018-12-28 NOTE — Patient Instructions (Signed)
[facility] at [facility] Discharge Instructions  Follow up in 4 months with labs    Thank you for choosing [facility] at [facility] to provide your oncology and hematology care.  To afford each patient quality time with our provider, please arrive at least 15 minutes before your scheduled appointment time.   If you have a lab appointment with the Cancer Center please come in thru the Main Entrance and check in at the main information desk.  You need to re-schedule your appointment should you arrive 10 or more minutes late.  We strive to give you quality time with our providers, and arriving late affects you and other patients whose appointments are after yours.  Also, if you no show three or more times for appointments you may be dismissed from the clinic at the providers discretion.     Again, thank you for choosing [facility].  Our hope is that these requests will decrease the amount of time that you wait before being seen by our physicians.       _____________________________________________________________  Should you have questions after your visit to [facility], please contact our office at [phone] between the hours of 8:00 a.m. and 4:30 p.m.  Voicemails left after 4:00 p.m. will not be returned until the following business day.  For prescription refill requests, have your pharmacy contact our office and allow 72 hours.    Due to Covid, you will need to wear a mask upon entering the hospital. If you do not have a mask, a mask will be given to you at the Main Entrance upon arrival. For doctor visits, patients may have 1 support person with them. For treatment visits, patients can not have anyone with them due to social distancing guidelines and our immunocompromised population.      

## 2018-12-28 NOTE — Assessment & Plan Note (Addendum)
1. Iron deficiency anemia: -It is felt secondary to chronic GI blood loss. - EGD 02/2016 showed [name] lesions in the stomach, which was possible source of bleeding. -Colonoscopy done on 09/26/2016 that showed a 4 mm polyp with pathology returned as tubular adenoma. -Patient is unable to tolerate oral iron therapy. -Last treated with IV iron 06/06/2016. - Labs done on 12/21/2018 WBC 5.5, hemoglobin 13.0, Platelets 185, ferritin 30, percent saturation 14 -We will see him back in 4 months with repeat labs.  2.IgG Lambda MGUS: - SPEP done on 9/8/2020showed stable M spike of 0.6 g/dL. - Kappa/lambda ratio normal at 1.40.  IgG 1593 which is stable. -Skeletal survey done on 09/23/2016 showed no bone lesions. -She has no evidence of anemia, hypercalcemia, or renal failure. -We will also set her up with another skeletal survey at her next visit. - We will see her back and check labs in 4 months.

## 2019-02-01 ENCOUNTER — Encounter: Payer: Self-pay | Admitting: Internal Medicine

## 2019-02-25 IMAGING — RF DG UGI W/ HIGH DENSITY W/KUB
12 of 17 series · 14 of 24 positions shown · non-contrast
Comparison: None.

CLINICAL DATA: Large hiatal hernia, epigastric and chest abdominal
pain, history reflux

EXAM:
UPPER GI SERIES WITH KUB
TECHNIQUE: After obtaining a scout radiograph a routine upper GI series was
performed using thin and high density barium as well as effervescent
granules
FLUOROSCOPY TIME:  Fluoroscopy Time:  1 minutes 48 seconds
Radiation Exposure Index (if provided by the fluoroscopic device):
46.6 mGy
Number of Acquired Spot Images: 4 plus multiple fluoroscopic screen
captures

[Series 1: t abdomen supine · 0.15mm/px · 1 of 1 slices shown]
[im 1/1]
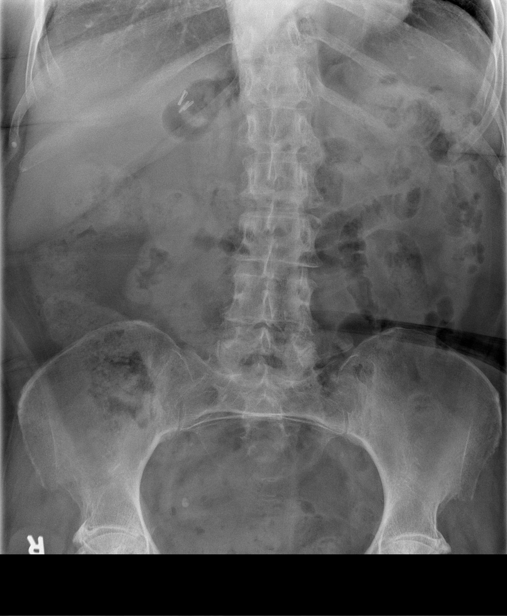

[Series 4: t abdomen barium · 0.15mm/px · 1 of 1 slices shown]
[im 1/1]
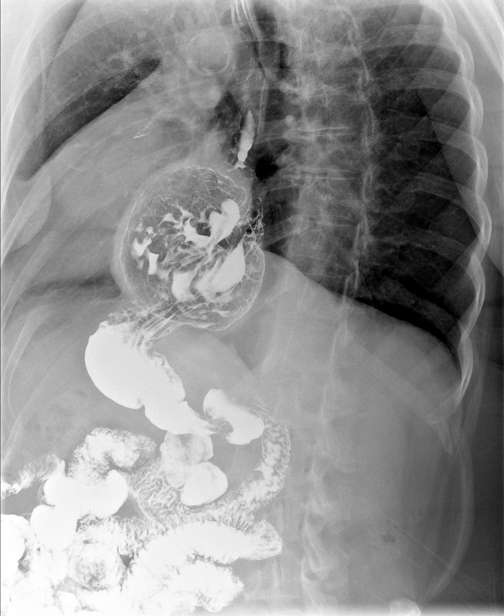

[Series 6: cp_standard · 0.18mm/px · 2 acquisitions, 1 frame shown (1 of 10)]
[im 1/2]
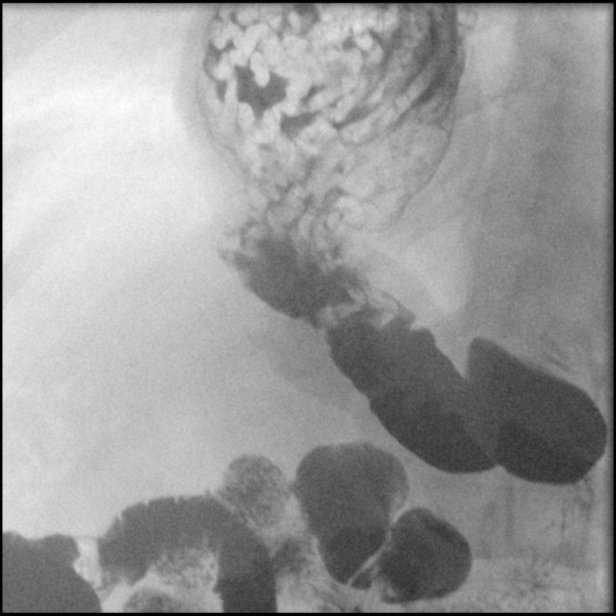

[Series 8: cp_standard · 0.18mm/px · 2 of 71 frames shown (2 of 10)]
[frame 11/71]
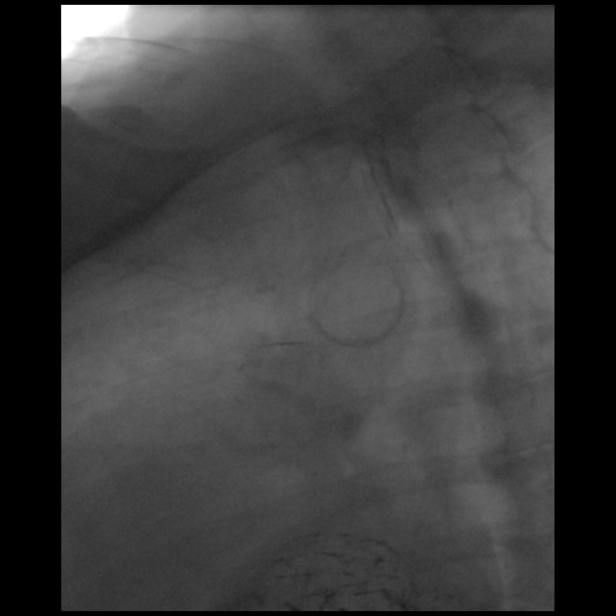
[frame 45/71]
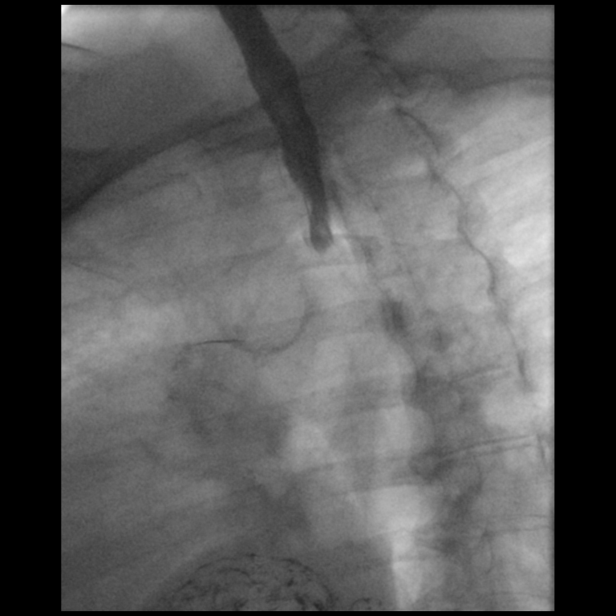

[Series 9: cp_standard · 0.18mm/px · 1 of 27 frames shown (3 of 10)]
[frame 5/27]
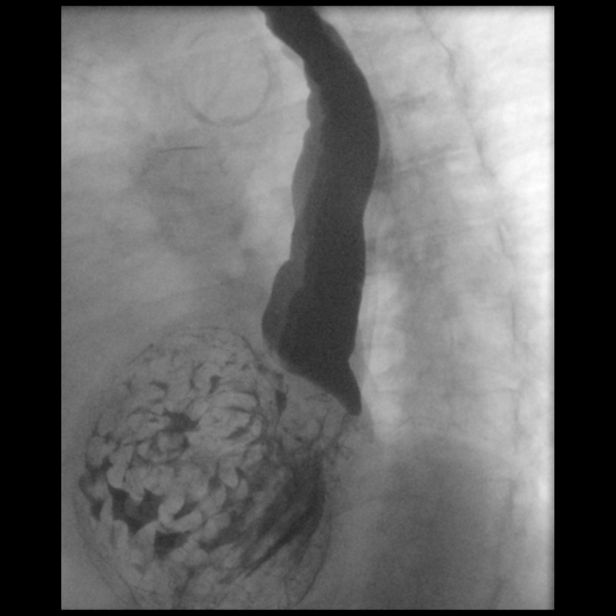

[Series 10: cp_standard · 0.19mm/px · 2 of 75 frames shown (4 of 10)]
[frame 12/75]
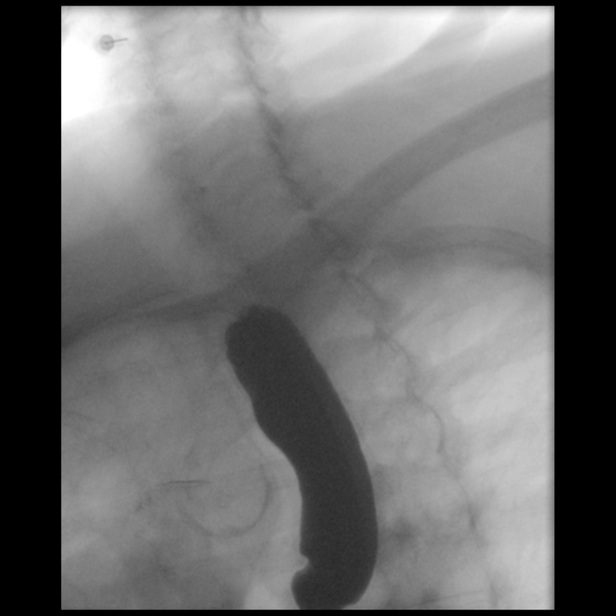
[frame 29/75]
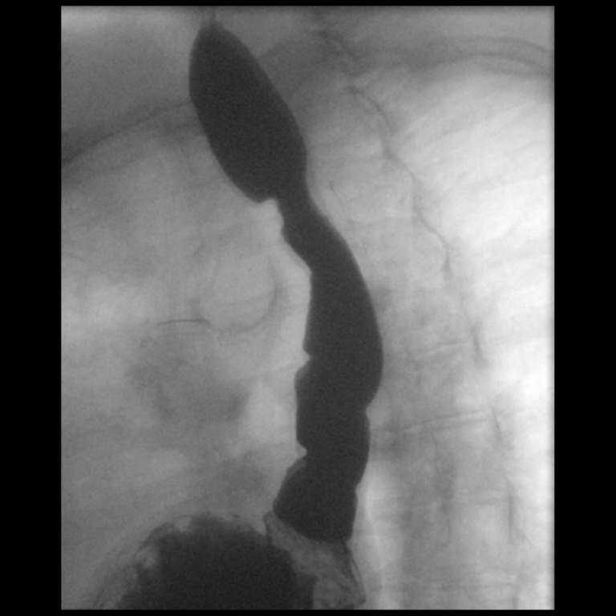

[Series 11: cp_standard · 0.19mm/px · 1 of 1 slices shown (5 of 10)]
[im 1/1]
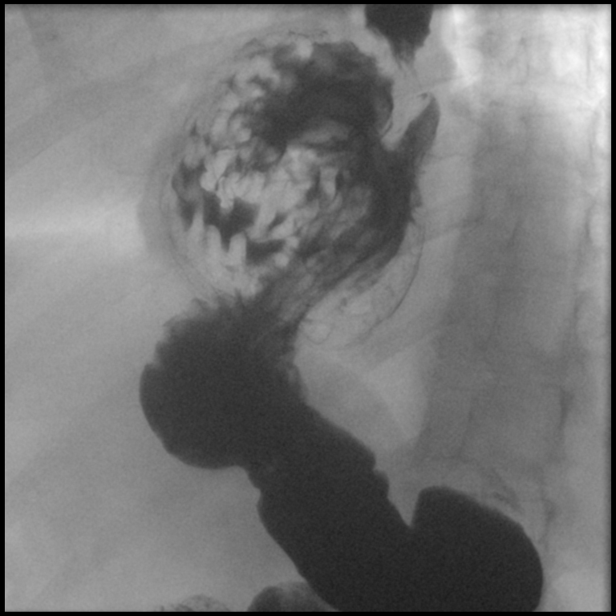

[Series 14: cp_standard · 0.19mm/px · 1 of 1 slices shown (6 of 10)]
[im 1/1]
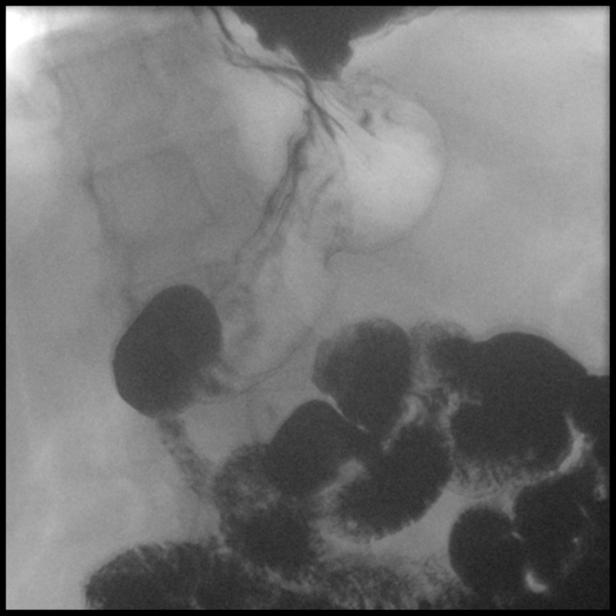

[Series 17: cp_standard · 0.19mm/px · 1 of 1 slices shown (7 of 10)]
[im 1/1]
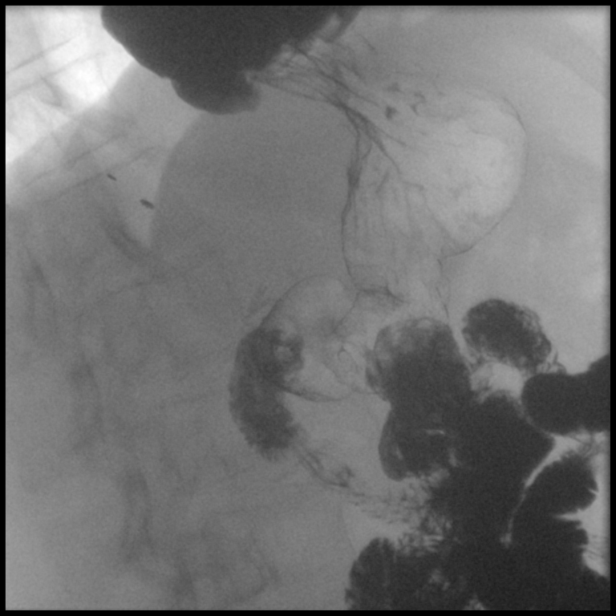

[Series 19: cp_standard · 0.19mm/px · 1 of 1 slices shown (8 of 10)]
[im 1/1]
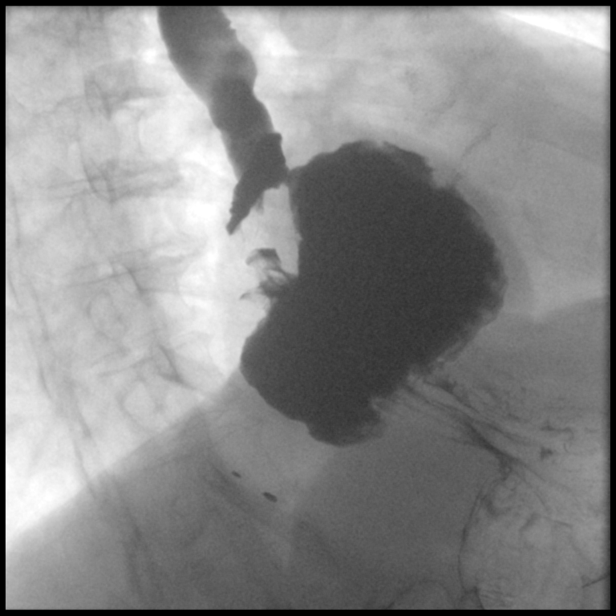

[Series 22: cp_standard · 0.18mm/px · 1 of 1 slices shown (9 of 10)]
[im 1/1]
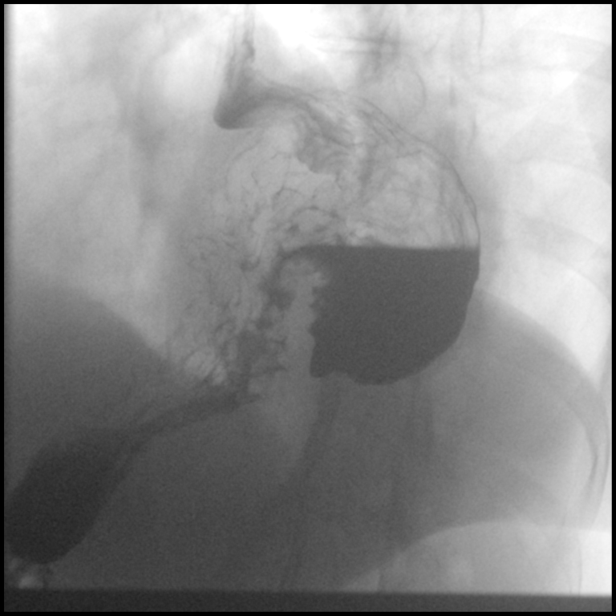

[Series 25: cp_standard · 0.20mm/px · 1 of 1 slices shown (10 of 10)]
[im 1/1]
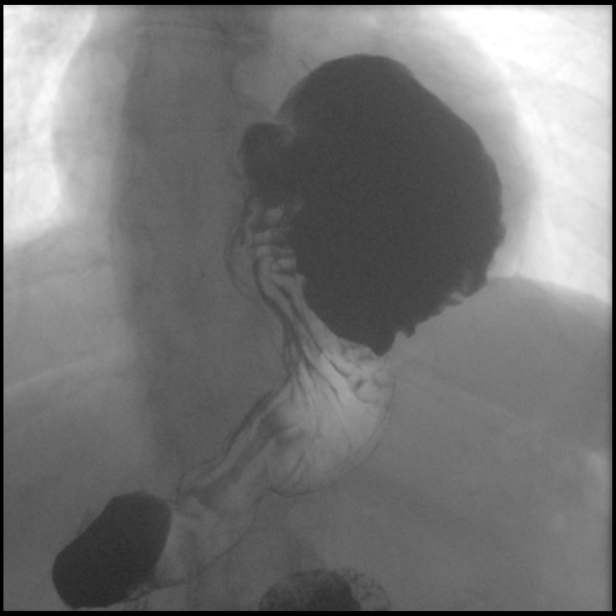

[14 of 24 positions shown; findings below may reference images not displayed]

FINDINGS: Normal esophageal distention without mass or obvious stricture.

Significant diffuse impairment of esophageal motility with poor
clearance of barium by primary peristaltic waves.

Prolonged thoracic retention of contrast.

Minimal gastroesophageal reflux seen during exam.

Large hiatal hernia, more than 50% of the stomach in the inferior
mediastinum.

No outlet obstruction from hernia sac into distal stomach.

Stomach distends normally with upper normal rugal fold thickness at
mid stomach.

No definite ulceration, mass or gastric outlet obstruction.

Duodenal bulb and sweep normal appearance with incidental note of a
large diverticulum arising from the distal third portion of the
duodenum.

Ligament of Treitz normal position.

Jejunal loops unremarkable.

Incidentally noted atherosclerotic calcification aortic arch osseous
demineralization.
IMPRESSION: Large hiatal hernia with greater than 50% of stomach in the inferior
thorax.

Minimal gastroesophageal reflux.

Significant diffuse esophageal dysmotility.

## 2019-03-08 ENCOUNTER — Encounter: Payer: Self-pay | Admitting: Internal Medicine

## 2019-03-08 ENCOUNTER — Ambulatory Visit: Payer: Medicare HMO | Admitting: Internal Medicine

## 2019-03-08 ENCOUNTER — Other Ambulatory Visit: Payer: Self-pay

## 2019-03-08 VITALS — BP 161/66 | HR 57 | Temp 97.6°F | Ht 64.0 in | Wt 175.8 lb

## 2019-03-08 DIAGNOSIS — D500 Iron deficiency anemia secondary to blood loss (chronic): Secondary | ICD-10-CM

## 2019-03-08 DIAGNOSIS — K449 Diaphragmatic hernia without obstruction or gangrene: Secondary | ICD-10-CM | POA: Diagnosis not present

## 2019-03-08 DIAGNOSIS — K219 Gastro-esophageal reflux disease without esophagitis: Secondary | ICD-10-CM | POA: Diagnosis not present

## 2019-03-08 NOTE — Patient Instructions (Signed)
Continue Protonix 40 mg daily  May take 2 Pepsid Complete as neeed for any flare in indigestion  OV in 6 months and as needed

## 2019-03-08 NOTE — Progress Notes (Signed)
Primary Care Physician:  [facility]:  Dr. Gala Romney  Pre-Procedure History & Physical: HPI:  Stacey Riley is a 78 y.o. female here for follow-up of a large hiatal hernia, GERD, iron deficiency anemia felt to be secondary to slow GI blood loss from [place] lesions.  Clinically, doing well on Protonix 40 mg daily.  Has had no melena or rectal bleeding.  Weight is up 6 pounds from her prior visit.  Last labs I see from September of this year ferritin 30,  CBC completely normal.  She has a large hiatal hernia.  She has a flare of symptoms with dietary indiscretion.   No dysphagia.  Most of the time she is well controlled on Protonix 40 mg once daily.  Past Medical History:  Diagnosis Date  . Anemia 03/14/2016  . Arthritis   . Asthma   . Bradycardia   . DM (diabetes mellitus) ([place])   . GERD (gastroesophageal reflux disease)   . High cholesterol   . HTN (hypertension)     Past Surgical History:  Procedure Laterality Date  . CHOLECYSTECTOMY  1990s  . COLONOSCOPY  03/18/1999   Dr Rourk-int  hemorrhoids, pancolonic diverticula  . COLONOSCOPY N/A 09/26/2016   Dr. Gala Romney: Diverticulosis, 4 mm polyp removed from the descending colon which is a tubular adenoma.  No future colonoscopies unless new symptoms arise.  Marland Kitchen COLONOSCOPY WITH ESOPHAGOGASTRODUODENOSCOPY (EGD)  08/06/2011   Rourk: Schatzki ring, moderate sized hiatal hernia status post dilation for history of dysphagia. Pancolonic diverticula, single diminutive polyp at the base of the cecum removed (tubular adenoma). Next colonoscopy recommended for April 2018  . ESOPHAGOGASTRODUODENOSCOPY  07/02/01   Dr Rourk-Schatzki's ring s/p 47F maloney dilation, otherwise normal/small hiatal hernia/   Linear erosion and ulceration in the proximal stomach/  The ulcerated lesion in the proximal stomach  benign.This may be a [name] lesion secondary to trauma to the mucosa, straddling the  diaphragmatic  hiatus in the presence of a hiatal hernia   . ESOPHAGOGASTRODUODENOSCOPY N/A 02/14/2016   Dr. Gala Romney: Large hiatal hernia, [name] lesion likely explains bleeding.  Marland Kitchen MALONEY DILATION  08/06/2011   Procedure: Venia Minks DILATION;  Surgeon: Daneil Dolin, MD;  [facility];  Service: Endoscopy;  Laterality: N/A;  . POLYPECTOMY  09/26/2016   Procedure: POLYPECTOMY;  Surgeon: Daneil Dolin, MD;  [facility];  Service: Endoscopy;;  descending colon  . TUBAL LIGATION      Prior to Admission medications   Medication Sig Start Date End Date Taking? Authorizing Provider  acetaminophen (TYLENOL) 500 MG tablet Take 500 mg by mouth daily as needed for headache.   Yes [provider]  albuterol (PROAIR HFA) 108 (90 BASE) MCG/ACT inhaler Inhale 2 puffs into the lungs every 6 (six) hours as needed for wheezing or shortness of breath.   Yes [provider]  Alcohol Swabs (B-D SINGLE USE SWABS REGULAR) PADS  11/09/18  Yes [provider]  amLODipine (NORVASC) 10 MG tablet Take 1 tablet (10 mg total) by mouth daily. 06/21/18 03/08/19 Yes Herminio Commons, MD  Ascorbic Acid (VITAMIN C) 100 MG tablet Take 100 mg by mouth daily.   Yes [provider]  aspirin 81 MG tablet Take 81 mg by mouth once a week.    Yes [provider]  Blood Glucose Calibration (TRUE METRIX LEVEL 1) Low SOLN  10/29/18  Yes [provider]  Blood Glucose Monitoring Suppl (TRUE METRIX  AIR GLUCOSE METER) w/Device KIT  10/27/18  Yes [provider]  Cyanocobalamin (B-12 PO) Take by mouth daily.   Yes [provider]  hydrochlorothiazide (HYDRODIURIL) 25 MG tablet Take 25 mg by mouth daily.  04/22/11  Yes [provider]  lisinopril (PRINIVIL,ZESTRIL) 40 MG tablet Take 40 mg by mouth daily.  04/22/11  Yes [provider]  loratadine (CLARITIN) 10 MG tablet Take 1 tablet (10 mg total) by mouth daily. Patient taking differently: Take 10 mg  by mouth daily as needed for allergies.  02/16/16  Yes Thurnell Lose, MD  nitroGLYCERIN (NITROSTAT) 0.4 MG SL tablet Place 1 tablet (0.4 mg total) under the tongue every 5 (five) minutes as needed for chest pain. 11/19/17  Yes Strader, [place] M, PA-C  pantoprazole (PROTONIX) 40 MG tablet Take 1 tablet (40 mg total) by mouth daily. 06/21/18  Yes Herminio Commons, MD  pravastatin (PRAVACHOL) 20 MG tablet Take 20 mg by mouth at bedtime.   Yes [provider]  TRUE METRIX BLOOD GLUCOSE TEST test strip  06/07/18  Yes [provider]  VITAMIN D PO Take by mouth daily.   Yes [provider]    Allergies as of 03/08/2019 - Review Complete 03/08/2019  Allergen Reaction Noted  . Augmentin [amoxicillin-pot clavulanate] Nausea And Vomiting 02/14/2016  . Clavulanic acid Nausea And Vomiting 07/08/2017  . Penicillin g Nausea And Vomiting 07/08/2017  . Sulfa antibiotics Swelling 07/14/2011    Family History  Problem Relation Age of Onset  . Aneurysm Daughter   . Stroke Daughter   . Diabetes Daughter   . Hypertension Mother   . Dementia Mother   . Hypertension Father   . Kidney cancer Brother   . Cancer Brother        brain  . Cancer Brother        bone  . Stroke Brother   . Hypertension Daughter   . Hypertension Son   . Hyperlipidemia Son   . Colon cancer Neg Hx   . Inflammatory bowel disease Neg Hx     Social History   Socioeconomic History  . Marital status: Divorced    Spouse name: Not on file  . Number of children: 5  . Years of education: Not on file  . Highest education level: Not on file  Occupational History  . Occupation: retired; Copy  . Financial resource strain: Not on file  . Food insecurity    Worry: Not on file    Inability: Not on file  . Transportation needs    Medical: Not on file    Non-medical: Not on file  Tobacco Use  . Smoking status: Never Smoker  . Smokeless tobacco: Never Used  Substance and Sexual  Activity  . Alcohol use: No  . Drug use: No  . Sexual activity: Not on file  Lifestyle  . Physical activity    Days per week: Not on file    Minutes per session: Not on file  . Stress: Not on file  Relationships  . Social Herbalist on phone: Not on file    Gets together: Not on file    Attends religious service: Not on file    Active member of club or organization: Not on file    Attends meetings of clubs or organizations: Not on file    Relationship status: Not on file  . Intimate partner violence    Fear of current or ex partner:  Not on file    Emotionally abused: Not on file    Physically abused: Not on file    Forced sexual activity: Not on file  Other Topics Concern  . Not on file  Social History Narrative   Lives alone    Review of Systems: See HPI, otherwise negative ROS  Physical Exam: BP (!) 161/66   Pulse (!) 57   Temp 97.6 F (36.4 C) (Oral)   Ht '5\' 4"'$  (1.626 m)   Wt 175 lb 12.8 oz (79.7 kg)   BMI 30.18 kg/m  General:   Alert,  Well-developed, well-nourished, pleasant and cooperative in NAD  Heart:  Regular rate and rhythm; no murmurs, clicks, rubs,  or gallops. Abdomen: Non-distended, normal bowel sounds.  Soft and nontender without appreciable mass or hepatosplenomegaly.  Pulses:  Normal pulses noted. Extremities:  Without clubbing or edema.  Impression/Plan:  79 year old lady with a history of iron deficiency anemia felt to be from slow GI blood loss (stomach) clinically doing well now.  Hematological parameters look much better back in September of this year.  Clinically, doing well aside from occasional breakthrough reflux symptoms on Protonix 40 mg once daily. Future colonoscopy or further GI evaluation not warranted unless new symptoms develop.  Recommendations:  Continue Protonix 40 mg daily  May use Pepcid Complete 1 to 2 tablets as needed flare in reflux symptoms  Unless something comes up, plan to see his nice lady back in 6  months.      Notice: This dictation was prepared with Dragon dictation along with smaller phrase technology. Any transcriptional errors that result from this process are unintentional and may not be corrected upon review.

## 2019-04-21 ENCOUNTER — Other Ambulatory Visit: Payer: Self-pay

## 2019-04-21 ENCOUNTER — Inpatient Hospital Stay (HOSPITAL_COMMUNITY): Payer: Medicare HMO | Attending: Hematology

## 2019-04-21 DIAGNOSIS — Z7982 Long term (current) use of aspirin: Secondary | ICD-10-CM | POA: Diagnosis not present

## 2019-04-21 DIAGNOSIS — Z8349 Family history of other endocrine, nutritional and metabolic diseases: Secondary | ICD-10-CM | POA: Diagnosis not present

## 2019-04-21 DIAGNOSIS — Z79899 Other long term (current) drug therapy: Secondary | ICD-10-CM | POA: Diagnosis not present

## 2019-04-21 DIAGNOSIS — I10 Essential (primary) hypertension: Secondary | ICD-10-CM | POA: Insufficient documentation

## 2019-04-21 DIAGNOSIS — E7800 Pure hypercholesterolemia, unspecified: Secondary | ICD-10-CM | POA: Insufficient documentation

## 2019-04-21 DIAGNOSIS — Z833 Family history of diabetes mellitus: Secondary | ICD-10-CM | POA: Insufficient documentation

## 2019-04-21 DIAGNOSIS — D509 Iron deficiency anemia, unspecified: Secondary | ICD-10-CM | POA: Diagnosis not present

## 2019-04-21 DIAGNOSIS — E119 Type 2 diabetes mellitus without complications: Secondary | ICD-10-CM | POA: Diagnosis not present

## 2019-04-21 DIAGNOSIS — Z8249 Family history of ischemic heart disease and other diseases of the circulatory system: Secondary | ICD-10-CM | POA: Insufficient documentation

## 2019-04-21 DIAGNOSIS — J45909 Unspecified asthma, uncomplicated: Secondary | ICD-10-CM | POA: Diagnosis not present

## 2019-04-21 DIAGNOSIS — D472 Monoclonal gammopathy: Secondary | ICD-10-CM | POA: Insufficient documentation

## 2019-04-21 LAB — CBC WITH DIFFERENTIAL/PLATELET
Abs Immature Granulocytes: 0.01 10*3/uL (ref 0.00–0.07)
Basophils Absolute: 0 10*3/uL (ref 0.0–0.1)
Basophils Relative: 1 %
Eosinophils Absolute: 0.1 10*3/uL (ref 0.0–0.5)
Eosinophils Relative: 1 %
HCT: 43.5 % (ref 36.0–46.0)
Hemoglobin: 13.7 g/dL (ref 12.0–15.0)
Immature Granulocytes: 0 %
Lymphocytes Relative: 44 %
Lymphs Abs: 2.4 10*3/uL (ref 0.7–4.0)
MCH: 28.5 pg (ref 26.0–34.0)
MCHC: 31.5 g/dL (ref 30.0–36.0)
MCV: 90.4 fL (ref 80.0–100.0)
Monocytes Absolute: 0.5 10*3/uL (ref 0.1–1.0)
Monocytes Relative: 9 %
Neutro Abs: 2.5 10*3/uL (ref 1.7–7.7)
Neutrophils Relative %: 45 %
Platelets: 204 10*3/uL (ref 150–400)
RBC: 4.81 MIL/uL (ref 3.87–5.11)
RDW: 13.9 % (ref 11.5–15.5)
WBC: 5.5 10*3/uL (ref 4.0–10.5)
nRBC: 0 % (ref 0.0–0.2)

## 2019-04-21 LAB — IRON AND TIBC
Iron: 81 ug/dL (ref 28–170)
Saturation Ratios: 20 % (ref 10.4–31.8)
TIBC: 408 ug/dL (ref 250–450)
UIBC: 327 ug/dL

## 2019-04-21 LAB — COMPREHENSIVE METABOLIC PANEL
ALT: 26 U/L (ref 0–44)
AST: 23 U/L (ref 15–41)
Albumin: 4 g/dL (ref 3.5–5.0)
Alkaline Phosphatase: 56 U/L (ref 38–126)
Anion gap: 9 (ref 5–15)
BUN: 24 mg/dL — ABNORMAL HIGH (ref 8–23)
CO2: 30 mmol/L (ref 22–32)
Calcium: 9.6 mg/dL (ref 8.9–10.3)
Chloride: 102 mmol/L (ref 98–111)
Creatinine, Ser: 1.03 mg/dL — ABNORMAL HIGH (ref 0.44–1.00)
GFR calc Af Amer: 60 mL/min — ABNORMAL LOW (ref >60)
GFR calc non Af Amer: 52 mL/min — ABNORMAL LOW (ref >60)
Glucose, Bld: 106 mg/dL — ABNORMAL HIGH (ref 70–99)
Potassium: 3.6 mmol/L (ref 3.5–5.1)
Sodium: 141 mmol/L (ref 135–145)
Total Bilirubin: 0.5 mg/dL (ref 0.3–1.2)
Total Protein: 7.9 g/dL (ref 6.5–8.1)

## 2019-04-21 LAB — VITAMIN B12: Vitamin B-12: 451 pg/mL (ref 180–914)

## 2019-04-21 LAB — FERRITIN: Ferritin: 20 ng/mL (ref 11–307)

## 2019-04-21 LAB — FOLATE: Folate: 19.5 ng/mL (ref >5.9)

## 2019-04-21 LAB — LACTATE DEHYDROGENASE: LDH: 137 U/L (ref 98–192)

## 2019-04-21 LAB — VITAMIN D 25 HYDROXY (VIT D DEFICIENCY, FRACTURES): Vit D, 25-Hydroxy: 35.97 ng/mL (ref 30–100)

## 2019-04-22 LAB — KAPPA/LAMBDA LIGHT CHAINS
Kappa free light chain: 31.4 mg/L — ABNORMAL HIGH (ref 3.3–19.4)
Kappa, lambda light chain ratio: 1.55 (ref 0.26–1.65)
Lambda free light chains: 20.2 mg/L (ref 5.7–26.3)

## 2019-04-22 LAB — IGG, IGA, IGM
IgA: 123 mg/dL (ref 64–422)
IgG (Immunoglobin G), Serum: 1592 mg/dL (ref 586–1602)
IgM (Immunoglobulin M), Srm: 65 mg/dL (ref 26–217)

## 2019-04-22 LAB — PROTEIN ELECTROPHORESIS, SERUM
A/G Ratio: 1.1 (ref 0.7–1.7)
Albumin ELP: 3.9 g/dL (ref 2.9–4.4)
Alpha-1-Globulin: 0.2 g/dL (ref 0.0–0.4)
Alpha-2-Globulin: 0.8 g/dL (ref 0.4–1.0)
Beta Globulin: 1 g/dL (ref 0.7–1.3)
Gamma Globulin: 1.5 g/dL (ref 0.4–1.8)
Globulin, Total: 3.5 g/dL (ref 2.2–3.9)
M-Spike, %: 0.7 g/dL — ABNORMAL HIGH
Total Protein ELP: 7.4 g/dL (ref 6.0–8.5)

## 2019-04-28 ENCOUNTER — Ambulatory Visit (HOSPITAL_COMMUNITY)
Admission: RE | Admit: 2019-04-28 | Discharge: 2019-04-28 | Disposition: A | Discharge status: Home / Self Care | Payer: Medicare HMO | Source: Ambulatory Visit | Attending: Nurse Practitioner | Admitting: Nurse Practitioner

## 2019-04-28 ENCOUNTER — Other Ambulatory Visit: Payer: Self-pay

## 2019-04-28 ENCOUNTER — Ambulatory Visit (HOSPITAL_COMMUNITY): Payer: Medicare HMO | Admitting: Nurse Practitioner

## 2019-04-28 DIAGNOSIS — D472 Monoclonal gammopathy: Secondary | ICD-10-CM | POA: Insufficient documentation

## 2019-05-05 ENCOUNTER — Encounter (HOSPITAL_COMMUNITY): Payer: Self-pay | Admitting: Nurse Practitioner

## 2019-05-05 ENCOUNTER — Other Ambulatory Visit: Payer: Self-pay

## 2019-05-05 ENCOUNTER — Inpatient Hospital Stay (HOSPITAL_COMMUNITY): Payer: Medicare HMO | Admitting: Nurse Practitioner

## 2019-05-05 DIAGNOSIS — D472 Monoclonal gammopathy: Secondary | ICD-10-CM

## 2019-05-05 NOTE — Patient Instructions (Addendum)
[facility]  Discharge Instructions: Follow up in 4 months with labs   You saw Randi Lockamy, NP, today. _______________________________________________________________  Thank you for choosing [facility] to provide your oncology and hematology care.  To afford each patient quality time with our providers, please arrive at least 15 minutes before your scheduled appointment.  You need to re-schedule your appointment if you arrive 10 or more minutes late.  We strive to give you quality time with our providers, and arriving late affects you and other patients whose appointments are after yours.  Also, if you no show three or more times for appointments you may be dismissed from the clinic.  Again, thank you for choosing  [facility] at [facility]. Our hope is that these requests will allow you access to exceptional care and in a timely manner. _______________________________________________________________  If you have questions after your visit, please contact our office at [phone] between the hours of 8:30 a.m. and 5:00 p.m. Voicemails left after 4:30 p.m. will not be returned until the following business day. _______________________________________________________________  For prescription refill requests, have your pharmacy contact our office. _______________________________________________________________  Recommendations made by the consultant and any test results will be sent to your referring physician. _______________________________________________________________ 

## 2019-05-05 NOTE — Assessment & Plan Note (Addendum)
1. Iron deficiency anemia: -It is felt secondary to chronic GI blood loss. - EGD 02/2016 showed [name] lesions in the stomach, which was possible source of bleeding. -Colonoscopy done on 09/26/2016 that showed a 4 mm polyp with pathology returned as tubular adenoma. -Patient is unable to tolerate oral iron therapy. -Last treated with IV iron 06/06/2016. - Labs done on 04/21/2019 WBC 5.5, hemoglobin 13.7, Platelets 204, ferritin 20, percent saturation 20 -We will see her back in 4 months with repeat labs.  2.IgG Lambda MGUS: - SPEP done on 04/21/2019 showed stable M spike of 0.7 g/dL. - Kappa/lambda ratio normal at 1.55.  IgG 1592 which is stable. -She has no evidence of anemia, hypercalcemia, or renal failure. -Skeletal survey done on 04/28/2019 showed no lytic or blastic lesions - We will see her back and check labs in 4 months.

## 2019-05-05 NOTE — Progress Notes (Signed)
[place], [place] [address]   CLINIC:  Medical Oncology/Hematology  PCP:  [facility] [place]  [id number] [phone]   REASON FOR VISIT: Follow-up for MGUS  CURRENT THERAPY: Observation   INTERVAL HISTORY:  Stacey Riley 79 y.o. female returns for routine follow-up for MGUS.  Patient reports she has been doing well since her last visit.  She does get fatigued at times.  However she fights the fatigue with light exercise.  She denies any bright red bleeding per rectum or melena.  She denies any easy bruising or bleeding.  She denies any unusual rash. Denies any nausea, vomiting, or diarrhea. Denies any new pains. Had not noticed any recent bleeding such as epistaxis, hematuria or hematochezia. Denies recent chest pain on exertion, shortness of breath on minimal exertion, pre-syncopal episodes, or palpitations. Denies any numbness or tingling in hands or feet. Denies any recent fevers, infections, or recent hospitalizations. Patient reports appetite at 100% and energy level at 75%.  SHe is eating well maintaining her weight at this time.    REVIEW OF SYSTEMS:  Review of Systems  All other systems reviewed and are negative.    PAST MEDICAL/SURGICAL HISTORY:  Past Medical History:  Diagnosis Date  . Anemia 03/14/2016  . Arthritis   . Asthma   . Bradycardia   . DM (diabetes mellitus) ([place])   . GERD (gastroesophageal reflux disease)   . High cholesterol   . HTN (hypertension)    Past Surgical History:  Procedure Laterality Date  . CHOLECYSTECTOMY  1990s  . COLONOSCOPY  03/18/1999   Dr Rourk-int  hemorrhoids, pancolonic diverticula  . COLONOSCOPY N/A 09/26/2016   Dr. Gala Romney: Diverticulosis, 4 mm polyp removed from the descending colon which is a tubular adenoma.  No future colonoscopies unless new symptoms arise.  Marland Kitchen COLONOSCOPY WITH ESOPHAGOGASTRODUODENOSCOPY (EGD)  08/06/2011   Rourk: Schatzki  ring, moderate sized hiatal hernia status post dilation for history of dysphagia. Pancolonic diverticula, single diminutive polyp at the base of the cecum removed (tubular adenoma). Next colonoscopy recommended for April 2018  . ESOPHAGOGASTRODUODENOSCOPY  07/02/01   Dr Rourk-Schatzki's ring s/p 23F maloney dilation, otherwise normal/small hiatal hernia/   Linear erosion and ulceration in the proximal stomach/  The ulcerated lesion in the proximal stomach  benign.This may be a [name] lesion secondary to trauma to the mucosa, straddling the  diaphragmatic hiatus in the presence of a hiatal hernia   . ESOPHAGOGASTRODUODENOSCOPY N/A 02/14/2016   Dr. Gala Romney: Large hiatal hernia, [name] lesion likely explains bleeding.  Marland Kitchen MALONEY DILATION  08/06/2011   Procedure: Venia Minks DILATION;  Surgeon: Daneil Dolin, MD;  [facility];  Service: Endoscopy;  Laterality: N/A;  . POLYPECTOMY  09/26/2016   Procedure: POLYPECTOMY;  Surgeon: Daneil Dolin, MD;  [facility];  Service: Endoscopy;;  descending colon  . TUBAL LIGATION       SOCIAL HISTORY:  Social History   Socioeconomic History  . Marital status: Divorced    Spouse name: Not on file  . Number of children: 5  . Years of education: Not on file  . Highest education level: Not on file  Occupational History  . Occupation: retired; Academic librarian  . Smoking status: Never Smoker  . Smokeless tobacco: Never Used  Substance and Sexual Activity  . Alcohol use: No  . Drug use: No  . Sexual activity: Not on file  Other Topics  Concern  . Not on file  Social History Narrative   Lives alone   Social Determinants of Health   Financial Resource Strain:   . Difficulty of Paying Living Expenses: Not on file  Food Insecurity:   . Worried About Charity fundraiser in the Last Year: Not on file  . Ran Out of Food in the Last Year: Not on file  Transportation Needs:   . Lack of Transportation (Medical): Not on file  . Lack  of Transportation (Non-Medical): Not on file  Physical Activity:   . Days of Exercise per Week: Not on file  . Minutes of Exercise per Session: Not on file  Stress:   . Feeling of Stress : Not on file  Social Connections:   . Frequency of Communication with Friends and Family: Not on file  . Frequency of Social Gatherings with Friends and Family: Not on file  . Attends Religious Services: Not on file  . Active Member of Clubs or Organizations: Not on file  . Attends Archivist Meetings: Not on file  . Marital Status: Not on file  Intimate Partner Violence:   . Fear of Current or Ex-Partner: Not on file  . Emotionally Abused: Not on file  . Physically Abused: Not on file  . Sexually Abused: Not on file    FAMILY HISTORY:  Family History  Problem Relation Age of Onset  . Aneurysm Daughter   . Stroke Daughter   . Diabetes Daughter   . Hypertension Mother   . Dementia Mother   . Hypertension Father   . Kidney cancer Brother   . Cancer Brother        brain  . Cancer Brother        bone  . Stroke Brother   . Hypertension Daughter   . Hypertension Son   . Hyperlipidemia Son   . Colon cancer Neg Hx   . Inflammatory bowel disease Neg Hx     CURRENT MEDICATIONS:  Outpatient Encounter Medications as of 05/05/2019  Medication Sig  . acetaminophen (TYLENOL) 500 MG tablet Take 500 mg by mouth daily as needed for headache.  . albuterol (PROAIR HFA) 108 (90 BASE) MCG/ACT inhaler Inhale 2 puffs into the lungs every 6 (six) hours as needed for wheezing or shortness of breath.  . Alcohol Swabs (B-D SINGLE USE SWABS REGULAR) PADS   . amLODipine (NORVASC) 10 MG tablet Take 1 tablet (10 mg total) by mouth daily.  . Ascorbic Acid (VITAMIN C) 100 MG tablet Take 100 mg by mouth daily.  Marland Kitchen aspirin 81 MG tablet Take 81 mg by mouth once a week.   . Blood Glucose Calibration (TRUE METRIX LEVEL 1) Low SOLN   . Blood Glucose Monitoring Suppl (TRUE METRIX AIR GLUCOSE METER) w/Device KIT     . Cyanocobalamin (B-12 PO) Take by mouth daily.  . hydrochlorothiazide (HYDRODIURIL) 25 MG tablet Take 25 mg by mouth daily.   Marland Kitchen lisinopril (PRINIVIL,ZESTRIL) 40 MG tablet Take 40 mg by mouth daily.   Marland Kitchen loratadine (CLARITIN) 10 MG tablet Take 1 tablet (10 mg total) by mouth daily. (Patient taking differently: Take 10 mg by mouth daily as needed for allergies. )  . nitroGLYCERIN (NITROSTAT) 0.4 MG SL tablet Place 1 tablet (0.4 mg total) under the tongue every 5 (five) minutes as needed for chest pain.  . pantoprazole (PROTONIX) 40 MG tablet Take 1 tablet (40 mg total) by mouth daily.  . pravastatin (PRAVACHOL) 20 MG tablet Take 20  mg by mouth at bedtime.  . TRUE METRIX BLOOD GLUCOSE TEST test strip   . VITAMIN D PO Take by mouth daily.   No facility-administered encounter medications on file as of 05/05/2019.    ALLERGIES:  Allergies  Allergen Reactions  . Augmentin [Amoxicillin-Pot Clavulanate] Nausea And Vomiting    Hematemesis Has patient had a PCN reaction causing immediate rash, facial/tongue/throat swelling, SOB or lightheadedness with hypotension: Unknown Has patient had a PCN reaction causing severe rash involving mucus membranes or skin necrosis: Unknown Has patient had a PCN reaction that required hospitalization: Yes Has patient had a PCN reaction occurring within the last 10 years: Yes If all of the above answers are "NO", then may proceed with Cephalosporin use.   Marland Kitchen Amoxicillin Other (See Comments)    Other reaction(s): GI Bleeding  . Clavulanic Acid Nausea And Vomiting  . Penicillin G Nausea And Vomiting    .Did it involve swelling of the face/tongue/throat, SOB, or low BP? No Did it involve sudden or severe rash/hives, skin peeling, or any reaction on the inside of your mouth or nose? Unknown Did you need to seek medical attention at a hospital or doctor's office? yes When did it last happen?Unknown If all above answers are "NO", may proceed with cephalosporin  use.   . Sulfa Antibiotics Swelling     PHYSICAL EXAM:  ECOG Performance status: 1  Vitals:   05/05/19 1032  BP: (!) 143/47  Pulse: (!) 52  Resp: 18  Temp: (!) 97.1 F (36.2 C)  SpO2: 100%   Filed Weights   05/05/19 1032  Weight: 157 lb (71.2 kg)    Physical Exam Constitutional:      Appearance: Normal appearance. She is normal weight.  Cardiovascular:     Rate and Rhythm: Normal rate and regular rhythm.     Heart sounds: Normal heart sounds.  Pulmonary:     Effort: Pulmonary effort is normal.     Breath sounds: Normal breath sounds.  Abdominal:     General: Bowel sounds are normal.     Palpations: Abdomen is soft.  Musculoskeletal:        General: Normal range of motion.  Skin:    General: Skin is warm.  Neurological:     Mental Status: She is alert and oriented to person, place, and time. Mental status is at baseline.  Psychiatric:        Mood and Affect: Mood normal.        Behavior: Behavior normal.        Thought Content: Thought content normal.        Judgment: Judgment normal.      LABORATORY DATA:  I have reviewed the labs as listed.  CBC    Component Value Date/Time   WBC 5.5 04/21/2019 1117   RBC 4.81 04/21/2019 1117   HGB 13.7 04/21/2019 1117   HCT 43.5 04/21/2019 1117   PLT 204 04/21/2019 1117   MCV 90.4 04/21/2019 1117   MCH 28.5 04/21/2019 1117   MCHC 31.5 04/21/2019 1117   RDW 13.9 04/21/2019 1117   LYMPHSABS 2.4 04/21/2019 1117   MONOABS 0.5 04/21/2019 1117   EOSABS 0.1 04/21/2019 1117   BASOSABS 0.0 04/21/2019 1117   CMP Latest Ref Rng & Units 04/21/2019 12/21/2018 06/15/2018  Glucose 70 - 99 mg/dL 106(H) 109(H) 106(H)  BUN 8 - 23 mg/dL 24(H) 21 20  Creatinine 0.44 - 1.00 mg/dL 1.03(H) 1.10(H) 1.14(H)  Sodium 135 - 145 mmol/L 141 140 140  Potassium 3.5 - 5.1 mmol/L 3.6 3.7 3.8  Chloride 98 - 111 mmol/L 102 102 107  CO2 22 - 32 mmol/L _0 Calcium 8.9 - 10.3 mg/dL 9.6 9.2 8.9  Total Protein 6.5 - 8.1 g/dL 7.9 7.8 7.3  Total  Bilirubin 0.3 - 1.2 mg/dL 0.5 0.5 0.4  Alkaline Phos 38 - 126 U/L 56 55 45  AST 15 - 41 U/L _1 ALT 0 - 44 U/L _2 DIAGNOSTIC IMAGING:  I have independently reviewed the skeletal scan and discussed with the patient.   I personally performed a face-to-face visit.  All questions were answered to patient's stated satisfaction. Encouraged patient to call with any new concerns or questions before his next visit to the cancer center and we can certain see him sooner, if needed.     ASSESSMENT & PLAN:   MGUS (monoclonal gammopathy of unknown significance) 1. Iron deficiency anemia: -It is felt secondary to chronic GI blood loss. - EGD 02/2016 showed [name] lesions in the stomach, which was possible source of bleeding. -Colonoscopy done on 09/26/2016 that showed a 4 mm polyp with pathology returned as tubular adenoma. -Patient is unable to tolerate oral iron therapy. -Last treated with IV iron 06/06/2016. - Labs done on 04/21/2019 WBC 5.5, hemoglobin 13.7, Platelets 204, ferritin 20, percent saturation 20 -We will see her back in 4 months with repeat labs.  2.IgG Lambda MGUS: - SPEP done on 04/21/2019 showed stable M spike of 0.7 g/dL. - Kappa/lambda ratio normal at 1.55.  IgG 1592 which is stable. -She has no evidence of anemia, hypercalcemia, or renal failure. -Skeletal survey done on 04/28/2019 showed no lytic or blastic lesions - We will see her back and check labs in 4 months.      Orders placed this encounter:  Orders Placed This Encounter  Procedures  . Lactate dehydrogenase  . Protein electrophoresis, serum  . Kappa/lambda light chains  . CBC with Differential/Platelet  . Comprehensive metabolic panel  . Ferritin  . Iron and TIBC  . Vitamin B12  . VITAMIN D 25 Hydroxy (Vit-D Deficiency, Fractures)  . Folate      Stacey Finders, FNP-C [place] [phone]

## 2019-05-19 ENCOUNTER — Other Ambulatory Visit: Payer: Self-pay | Admitting: Cardiovascular Disease

## 2019-07-28 ENCOUNTER — Encounter: Payer: Self-pay | Admitting: Internal Medicine

## 2019-09-01 ENCOUNTER — Other Ambulatory Visit: Payer: Self-pay

## 2019-09-01 ENCOUNTER — Inpatient Hospital Stay (HOSPITAL_COMMUNITY): Payer: Medicare HMO | Attending: Hematology

## 2019-09-01 DIAGNOSIS — Z833 Family history of diabetes mellitus: Secondary | ICD-10-CM | POA: Diagnosis not present

## 2019-09-01 DIAGNOSIS — Z8349 Family history of other endocrine, nutritional and metabolic diseases: Secondary | ICD-10-CM | POA: Diagnosis not present

## 2019-09-01 DIAGNOSIS — Z8249 Family history of ischemic heart disease and other diseases of the circulatory system: Secondary | ICD-10-CM | POA: Diagnosis not present

## 2019-09-01 DIAGNOSIS — J45909 Unspecified asthma, uncomplicated: Secondary | ICD-10-CM | POA: Diagnosis not present

## 2019-09-01 DIAGNOSIS — Z7982 Long term (current) use of aspirin: Secondary | ICD-10-CM | POA: Insufficient documentation

## 2019-09-01 DIAGNOSIS — I10 Essential (primary) hypertension: Secondary | ICD-10-CM | POA: Diagnosis not present

## 2019-09-01 DIAGNOSIS — N189 Chronic kidney disease, unspecified: Secondary | ICD-10-CM | POA: Insufficient documentation

## 2019-09-01 DIAGNOSIS — D472 Monoclonal gammopathy: Secondary | ICD-10-CM | POA: Insufficient documentation

## 2019-09-01 DIAGNOSIS — Z79899 Other long term (current) drug therapy: Secondary | ICD-10-CM | POA: Diagnosis not present

## 2019-09-01 DIAGNOSIS — E7800 Pure hypercholesterolemia, unspecified: Secondary | ICD-10-CM | POA: Diagnosis not present

## 2019-09-01 DIAGNOSIS — E119 Type 2 diabetes mellitus without complications: Secondary | ICD-10-CM | POA: Diagnosis not present

## 2019-09-01 DIAGNOSIS — Z809 Family history of malignant neoplasm, unspecified: Secondary | ICD-10-CM | POA: Insufficient documentation

## 2019-09-01 DIAGNOSIS — D509 Iron deficiency anemia, unspecified: Secondary | ICD-10-CM | POA: Insufficient documentation

## 2019-09-01 DIAGNOSIS — K219 Gastro-esophageal reflux disease without esophagitis: Secondary | ICD-10-CM | POA: Insufficient documentation

## 2019-09-01 LAB — CBC WITH DIFFERENTIAL/PLATELET
Abs Immature Granulocytes: 0.01 10*3/uL (ref 0.00–0.07)
Basophils Absolute: 0 10*3/uL (ref 0.0–0.1)
Basophils Relative: 1 %
Eosinophils Absolute: 0.1 10*3/uL (ref 0.0–0.5)
Eosinophils Relative: 2 %
HCT: 43.4 % (ref 36.0–46.0)
Hemoglobin: 13.5 g/dL (ref 12.0–15.0)
Immature Granulocytes: 0 %
Lymphocytes Relative: 45 %
Lymphs Abs: 2.5 10*3/uL (ref 0.7–4.0)
MCH: 27.8 pg (ref 26.0–34.0)
MCHC: 31.1 g/dL (ref 30.0–36.0)
MCV: 89.3 fL (ref 80.0–100.0)
Monocytes Absolute: 0.5 10*3/uL (ref 0.1–1.0)
Monocytes Relative: 10 %
Neutro Abs: 2.4 10*3/uL (ref 1.7–7.7)
Neutrophils Relative %: 42 %
Platelets: 191 10*3/uL (ref 150–400)
RBC: 4.86 MIL/uL (ref 3.87–5.11)
RDW: 14.3 % (ref 11.5–15.5)
WBC: 5.6 10*3/uL (ref 4.0–10.5)
nRBC: 0 % (ref 0.0–0.2)

## 2019-09-01 LAB — COMPREHENSIVE METABOLIC PANEL
ALT: 29 U/L (ref 0–44)
AST: 26 U/L (ref 15–41)
Albumin: 4.1 g/dL (ref 3.5–5.0)
Alkaline Phosphatase: 56 U/L (ref 38–126)
Anion gap: 13 (ref 5–15)
BUN: 20 mg/dL (ref 8–23)
CO2: 28 mmol/L (ref 22–32)
Calcium: 9.7 mg/dL (ref 8.9–10.3)
Chloride: 100 mmol/L (ref 98–111)
Creatinine, Ser: 0.92 mg/dL (ref 0.44–1.00)
GFR calc Af Amer: >60 mL/min (ref >60)
GFR calc non Af Amer: 59 mL/min — ABNORMAL LOW (ref >60)
Glucose, Bld: 102 mg/dL — ABNORMAL HIGH (ref 70–99)
Potassium: 3.7 mmol/L (ref 3.5–5.1)
Sodium: 141 mmol/L (ref 135–145)
Total Bilirubin: 0.4 mg/dL (ref 0.3–1.2)
Total Protein: 7.8 g/dL (ref 6.5–8.1)

## 2019-09-01 LAB — VITAMIN D 25 HYDROXY (VIT D DEFICIENCY, FRACTURES): Vit D, 25-Hydroxy: 32.18 ng/mL (ref 30–100)

## 2019-09-01 LAB — IRON AND TIBC
Iron: 70 ug/dL (ref 28–170)
Saturation Ratios: 17 % (ref 10.4–31.8)
TIBC: 418 ug/dL (ref 250–450)
UIBC: 348 ug/dL

## 2019-09-01 LAB — LACTATE DEHYDROGENASE: LDH: 133 U/L (ref 98–192)

## 2019-09-01 LAB — FERRITIN: Ferritin: 15 ng/mL (ref 11–307)

## 2019-09-01 LAB — VITAMIN B12: Vitamin B-12: 694 pg/mL (ref 180–914)

## 2019-09-01 LAB — FOLATE: Folate: 63.3 ng/mL (ref >5.9)

## 2019-09-02 LAB — PROTEIN ELECTROPHORESIS, SERUM
A/G Ratio: 0.9 (ref 0.7–1.7)
Albumin ELP: 3.5 g/dL (ref 2.9–4.4)
Alpha-1-Globulin: 0.2 g/dL (ref 0.0–0.4)
Alpha-2-Globulin: 0.9 g/dL (ref 0.4–1.0)
Beta Globulin: 1.1 g/dL (ref 0.7–1.3)
Gamma Globulin: 1.7 g/dL (ref 0.4–1.8)
Globulin, Total: 3.9 g/dL (ref 2.2–3.9)
M-Spike, %: 0.9 g/dL — ABNORMAL HIGH
Total Protein ELP: 7.4 g/dL (ref 6.0–8.5)

## 2019-09-02 LAB — KAPPA/LAMBDA LIGHT CHAINS
Kappa free light chain: 24.3 mg/L — ABNORMAL HIGH (ref 3.3–19.4)
Kappa, lambda light chain ratio: 1.66 — ABNORMAL HIGH (ref 0.26–1.65)
Lambda free light chains: 14.6 mg/L (ref 5.7–26.3)

## 2019-09-06 ENCOUNTER — Encounter: Payer: Self-pay | Admitting: Gastroenterology

## 2019-09-06 ENCOUNTER — Ambulatory Visit (INDEPENDENT_AMBULATORY_CARE_PROVIDER_SITE_OTHER): Payer: Medicare HMO | Admitting: Gastroenterology

## 2019-09-06 ENCOUNTER — Other Ambulatory Visit: Payer: Self-pay

## 2019-09-06 DIAGNOSIS — K449 Diaphragmatic hernia without obstruction or gangrene: Secondary | ICD-10-CM

## 2019-09-06 DIAGNOSIS — K219 Gastro-esophageal reflux disease without esophagitis: Secondary | ICD-10-CM

## 2019-09-06 DIAGNOSIS — D500 Iron deficiency anemia secondary to blood loss (chronic): Secondary | ICD-10-CM | POA: Diagnosis not present

## 2019-09-06 NOTE — Patient Instructions (Signed)
1. Please continue pantoprazole 40 mg daily. 2. Continue to have your labs monitor periodically regarding iron deficiency anemia.  Dr. Tomie China office has been doing this for you.  Continue to follow with them as scheduled. 3. We will plan to see you back in 1 year or call sooner if you develop any abdominal pain, poorly controlled reflux, swallowing concerns, vomiting, blood in the stool, worsening anemia.

## 2019-09-06 NOTE — Progress Notes (Signed)
Primary Care Physician:  [facility]:  [name], MD   Patient Location: Home  Provider Location: [facility] office  Reason for Phone Visit:  Chief Complaint  Patient presents with  . Gastroesophageal Reflux    f/u. Doing okay as long as she doesn't eat something that makes it flare    Persons present on the phone encounter, with roles: Patient, myself (provider),Mindy Estudillo CMA (updated meds and allergies)  Total time (minutes) spent on medical discussion: 8 minutes  Due to COVID-19, visit was conducted using telephonic method (no video was available).  Visit was requested by patient.  Virtual Visit via Telephone only  I connected with Stacey Riley on 09/06/19 at  8:00 AM EDT by telephone and verified that I am speaking with the correct person using two identifiers.   I discussed the limitations, risks, security and privacy concerns of performing an evaluation and management service by telephone and the availability of in person appointments. I also discussed with the patient that there may be a patient responsible charge related to this service. The patient expressed understanding and agreed to proceed.   HPI:   Patient is a pleasant 79 y/o female  who presents for telephone visit regarding large hiatal hernia, GERD, IDA felt to be secondary to slow GI blood loss from [place] lesions.  She was last seen in November 2020.  Clinically she is doing very well.  Occasionally takes Pepcid Complete if she eats something that aggravates her reflux.  No abdominal pain.  No dysphagia.  No vomiting or regurgitation.  Weight is stable.  She sees hematology later this week.  Had labs recently which showed normal iron and hemoglobin.  See below.  Bowel movements are regular.  No blood in the stool or melena.    Current Outpatient Medications  Medication Sig Dispense Refill  . acetaminophen (TYLENOL) 500 MG tablet Take 500 mg by mouth daily as  needed for headache.    . albuterol (PROAIR HFA) 108 (90 BASE) MCG/ACT inhaler Inhale 2 puffs into the lungs every 6 (six) hours as needed for wheezing or shortness of breath.    . Alcohol Swabs (B-D SINGLE USE SWABS REGULAR) PADS     . amLODipine (NORVASC) 10 MG tablet TAKE 1 TABLET (10 MG TOTAL) BY MOUTH DAILY. 90 tablet 3  . Ascorbic Acid (VITAMIN C) 100 MG tablet Take 100 mg by mouth daily.    Marland Kitchen aspirin 81 MG tablet Take 81 mg by mouth once a week.     . Blood Glucose Calibration (TRUE METRIX LEVEL 1) Low SOLN     . Blood Glucose Monitoring Suppl (TRUE METRIX AIR GLUCOSE METER) w/Device KIT     . Cyanocobalamin (B-12 PO) Take by mouth daily.    . Famotidine-Ca Carb-Mag Hydrox (PEPCID COMPLETE PO) Take by mouth as needed.    . hydrochlorothiazide (HYDRODIURIL) 25 MG tablet Take 25 mg by mouth daily.     Marland Kitchen lisinopril (PRINIVIL,ZESTRIL) 40 MG tablet Take 40 mg by mouth daily.     Marland Kitchen loratadine (CLARITIN) 10 MG tablet Take 1 tablet (10 mg total) by mouth daily. (Patient taking differently: Take 10 mg by mouth daily as needed for allergies. ) 30 tablet 0  . nitroGLYCERIN (NITROSTAT) 0.4 MG SL tablet Place 1 tablet (0.4 mg total) under the tongue every 5 (five) minutes as needed for chest pain. 25 tablet 3  . pantoprazole (PROTONIX) 40 MG tablet Take 1 tablet (  40 mg total) by mouth daily. 90 tablet 3  . pravastatin (PRAVACHOL) 20 MG tablet Take 20 mg by mouth at bedtime.    . TRUE METRIX BLOOD GLUCOSE TEST test strip     . VITAMIN D PO Take by mouth daily.     No current facility-administered medications for this visit.    Past Medical History:  Diagnosis Date  . Anemia 03/14/2016  . Arthritis   . Asthma   . Bradycardia   . DM (diabetes mellitus) ([place])   . GERD (gastroesophageal reflux disease)   . High cholesterol   . HTN (hypertension)     Past Surgical History:  Procedure Laterality Date  . CHOLECYSTECTOMY  1990s  . COLONOSCOPY  03/18/1999   Dr Rourk-int  hemorrhoids, pancolonic  diverticula  . COLONOSCOPY N/A 09/26/2016   Dr. Gala Romney: Diverticulosis, 4 mm polyp removed from the descending colon which is a tubular adenoma.  No future colonoscopies unless new symptoms arise.  Marland Kitchen COLONOSCOPY WITH ESOPHAGOGASTRODUODENOSCOPY (EGD)  08/06/2011   Rourk: Schatzki ring, moderate sized hiatal hernia status post dilation for history of dysphagia. Pancolonic diverticula, single diminutive polyp at the base of the cecum removed (tubular adenoma). Next colonoscopy recommended for April 2018  . ESOPHAGOGASTRODUODENOSCOPY  07/02/01   Dr Rourk-Schatzki's ring s/p 48F maloney dilation, otherwise normal/small hiatal hernia/   Linear erosion and ulceration in the proximal stomach/  The ulcerated lesion in the proximal stomach  benign.This may be a [name] lesion secondary to trauma to the mucosa, straddling the  diaphragmatic hiatus in the presence of a hiatal hernia   . ESOPHAGOGASTRODUODENOSCOPY N/A 02/14/2016   Dr. Gala Romney: Large hiatal hernia, [name] lesion likely explains bleeding.  Marland Kitchen MALONEY DILATION  08/06/2011   Procedure: Venia Minks DILATION;  Surgeon: Daneil Dolin, MD;  [facility];  Service: Endoscopy;  Laterality: N/A;  . POLYPECTOMY  09/26/2016   Procedure: POLYPECTOMY;  Surgeon: Daneil Dolin, MD;  [facility]: [facility];  Service: Endoscopy;;  descending colon  . TUBAL LIGATION      Family History  Problem Relation Age of Onset  . Aneurysm Daughter   . Stroke Daughter   . Diabetes Daughter   . Hypertension Mother   . Dementia Mother   . Hypertension Father   . Kidney cancer Brother   . Cancer Brother        brain  . Cancer Brother        bone  . Stroke Brother   . Hypertension Daughter   . Hypertension Son   . Hyperlipidemia Son   . Colon cancer Neg Hx   . Inflammatory bowel disease Neg Hx     Social History   Socioeconomic History  . Marital status: Divorced    Spouse name: Not on file  . Number of children: 5  . Years of education: Not on file    . Highest education level: Not on file  Occupational History  . Occupation: retired; Academic librarian  . Smoking status: Never Smoker  . Smokeless tobacco: Never Used  Substance and Sexual Activity  . Alcohol use: No  . Drug use: No  . Sexual activity: Not on file  Other Topics Concern  . Not on file  Social History Narrative   Lives alone   Social Determinants of Health   Financial Resource Strain:   . Difficulty of Paying Living Expenses:   Food Insecurity:   . Worried About Charity fundraiser in the Last Year:   .  Ran Out of Food in the Last Year:   Transportation Needs:   . Film/video editor (Medical):   Marland Kitchen Lack of Transportation (Non-Medical):   Physical Activity:   . Days of Exercise per Week:   . Minutes of Exercise per Session:   Stress:   . Feeling of Stress :   Social Connections:   . Frequency of Communication with Friends and Family:   . Frequency of Social Gatherings with Friends and Family:   . Attends Religious Services:   . Active Member of Clubs or Organizations:   . Attends Archivist Meetings:   Marland Kitchen Marital Status:   Intimate Partner Violence:   . Fear of Current or Ex-Partner:   . Emotionally Abused:   Marland Kitchen Physically Abused:   . Sexually Abused:       ROS:  General: Negative for anorexia, weight loss, fever, chills, fatigue, weakness. Eyes: Negative for vision changes.  ENT: Negative for hoarseness, difficulty swallowing , nasal congestion. CV: Negative for chest pain, angina, palpitations, dyspnea on exertion, peripheral edema.  Respiratory: Negative for dyspnea at rest, dyspnea on exertion, cough, sputum, wheezing.  GI: See history of present illness. GU:  Negative for dysuria, hematuria, urinary incontinence, urinary frequency, nocturnal urination.  MS: Negative for joint pain, low back pain.  Derm: Negative for rash or itching.  Neuro: Negative for weakness, abnormal sensation, seizure, frequent headaches, memory loss,  confusion.  Psych: Negative for anxiety, depression, suicidal ideation, hallucinations.  Endo: Negative for unusual weight change.  Heme: Negative for bruising or bleeding. Allergy: Negative for rash or hives.   Observations/Objective:  Lab Results  Component Value Date   IRON 70 09/01/2019   TIBC 418 09/01/2019   FERRITIN 15 09/01/2019   Lab Results  Component Value Date   CREATININE 0.92 09/01/2019   BUN 20 09/01/2019   NA 141 09/01/2019   K 3.7 09/01/2019   CL 100 09/01/2019   CO2 28 09/01/2019   Lab Results  Component Value Date   ALT 29 09/01/2019   AST 26 09/01/2019   ALKPHOS 56 09/01/2019   BILITOT 0.4 09/01/2019   Lab Results  Component Value Date   WBC 5.6 09/01/2019   HGB 13.5 09/01/2019   HCT 43.4 09/01/2019   MCV 89.3 09/01/2019   PLT 191 09/01/2019   Lab Results  Component Value Date   IRON 70 09/01/2019   TIBC 418 09/01/2019   FERRITIN 15 09/01/2019   Lab Results  Component Value Date   FOLATE 63.3 09/01/2019   Lab Results  Component Value Date   [id number] 09/01/2019   Pleasant female no acute distress.  Alert and oriented.  Otherwise exam unavailable.  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  Clinically she is doing well.  She takes pantoprazole 40 mg daily.  For occasional reflux symptoms she takes Pepcid Complete in addition to her PPI.  Her last iron infusion has been over 2 years ago.  She continues to follow-up with hematology, labs last week revealed a hemoglobin of 13.5, normal iron, low normal ferritin.  Ferritin has declined from 30-15 over the past 8 months.  She sees hematology this week.  Patient wonders if she could see [place] once a year because she has been doing so well.  This is appropriate as long she continues to have her hemoglobin and iron checked periodically, which she has been doing with her hematologist.  She understands that she  is at risk of recurrent IDA in the setting of large hiatal hernia/[place] lesions.  Continue PPI indefinitely.  No plans for colonoscopy unless she has any significant changes.  We will plan to see her back in a year but she should call sooner if needed.  Follow Up Instructions:    I discussed the assessment and treatment plan with the patient. The patient was provided an opportunity to ask questions and all were answered. The patient agreed with the plan and demonstrated an understanding of the instructions. AVS mailed to patient's home address.   The patient was advised to call back or seek an in-person evaluation if the symptoms worsen or if the condition fails to improve as anticipated.  I provided 8 minutes of non-face-to-face time during this encounter.   Neil Crouch, PA-C

## 2019-09-06 NOTE — Progress Notes (Signed)
Cc'ed to pcp °

## 2019-09-07 ENCOUNTER — Telehealth: Payer: Self-pay | Admitting: *Deleted

## 2019-09-07 NOTE — Telephone Encounter (Signed)
Stacey Riley, you are scheduled for a virtual visit with your provider today.  Just as we do with appointments in the office, we must obtain your consent to participate.  Your consent will be active for this visit and any virtual visit you may have with one of our providers in the next 365 days.  If you have a MyChart account, I can also send a copy of this consent to you electronically.  All virtual visits are billed to your insurance company just like a traditional visit in the office.  As this is a virtual visit, video technology does not allow for your provider to perform a traditional examination.  This may limit your provider's ability to fully assess your condition.  If your provider identifies any concerns that need to be evaluated in person or the need to arrange testing such as labs, EKG, etc, we will make arrangements to do so.  Although advances in technology are sophisticated, we cannot ensure that it will always work on either your end or our end.  If the connection with a video visit is poor, we may have to switch to a telephone visit.  With either a video or telephone visit, we are not always able to ensure that we have a secure connection.   I need to obtain your verbal consent now.   Are you willing to proceed with your visit today?

## 2019-09-07 NOTE — Telephone Encounter (Signed)
Pt consented to telephone visit on 09/06/19. 

## 2019-09-08 ENCOUNTER — Other Ambulatory Visit: Payer: Self-pay

## 2019-09-08 ENCOUNTER — Other Ambulatory Visit (HOSPITAL_COMMUNITY): Payer: Self-pay | Admitting: *Deleted

## 2019-09-08 ENCOUNTER — Inpatient Hospital Stay (HOSPITAL_COMMUNITY): Payer: Medicare HMO | Admitting: Hematology

## 2019-09-08 VITALS — BP 137/46 | HR 53 | Temp 96.6°F | Resp 18 | Wt 174.8 lb

## 2019-09-08 DIAGNOSIS — D500 Iron deficiency anemia secondary to blood loss (chronic): Secondary | ICD-10-CM

## 2019-09-08 DIAGNOSIS — D472 Monoclonal gammopathy: Secondary | ICD-10-CM | POA: Diagnosis not present

## 2019-09-08 NOTE — Progress Notes (Signed)
[address], [place] [address]   CLINIC:  Medical Oncology/Hematology  PCP:  [facility] / [place] [address]  [phone]  REASON FOR VISIT:  Follow-up for MGUS and iron deficiency anemia.  PRIOR THERAPY: Intermittent parenteral iron therapy.  CURRENT THERAPY: Observation  INTERVAL HISTORY:  [name], a 79 y.o. female, returns for routine follow-up for her MGUS. Stacey Riley was last seen on 05/05/2019. Denies weight loss or fevers. Denies back pain. Mild fatigue.  Denies any new onset pains.  REVIEW OF SYSTEMS:  Review of Systems  All other systems reviewed and are negative.   PAST MEDICAL/SURGICAL HISTORY:  Past Medical History:  Diagnosis Date  . Anemia 03/14/2016  . Arthritis   . Asthma   . Bradycardia   . DM (diabetes mellitus) ([place])   . GERD (gastroesophageal reflux disease)   . High cholesterol   . HTN (hypertension)    Past Surgical History:  Procedure Laterality Date  . CHOLECYSTECTOMY  1990s  . COLONOSCOPY  03/18/1999   Dr Rourk-int  hemorrhoids, pancolonic diverticula  . COLONOSCOPY N/A 09/26/2016   Dr. Gala Romney: Diverticulosis, 4 mm polyp removed from the descending colon which is a tubular adenoma.  No future colonoscopies unless new symptoms arise.  Marland Kitchen COLONOSCOPY WITH ESOPHAGOGASTRODUODENOSCOPY (EGD)  08/06/2011   Rourk: Schatzki ring, moderate sized hiatal hernia status post dilation for history of dysphagia. Pancolonic diverticula, single diminutive polyp at the base of the cecum removed (tubular adenoma). Next colonoscopy recommended for April 2018  . ESOPHAGOGASTRODUODENOSCOPY  07/02/01   Dr Rourk-Schatzki's ring s/p 63F maloney dilation, otherwise normal/small hiatal hernia/   Linear erosion and ulceration in the proximal stomach/  The ulcerated lesion in the proximal stomach  benign.This may be a [name] lesion secondary to trauma to the mucosa, straddling the  diaphragmatic  hiatus in the presence of a hiatal hernia   . ESOPHAGOGASTRODUODENOSCOPY N/A 02/14/2016   Dr. Gala Romney: Large hiatal hernia, [name] lesion likely explains bleeding.  Marland Kitchen MALONEY DILATION  08/06/2011   Procedure: Venia Minks DILATION;  Surgeon: Daneil Dolin, MD;  [facility];  Service: Endoscopy;  Laterality: N/A;  . POLYPECTOMY  09/26/2016   Procedure: POLYPECTOMY;  Surgeon: Daneil Dolin, MD;  [facility];  Service: Endoscopy;;  descending colon  . TUBAL LIGATION      SOCIAL HISTORY:  Social History   Socioeconomic History  . Marital status: Divorced    Spouse name: Not on file  . Number of children: 5  . Years of education: Not on file  . Highest education level: Not on file  Occupational History  . Occupation: retired; Academic librarian  . Smoking status: Never Smoker  . Smokeless tobacco: Never Used  Substance and Sexual Activity  . Alcohol use: No  . Drug use: No  . Sexual activity: Not on file  Other Topics Concern  . Not on file  Social History Narrative   Lives alone   Social Determinants of Health   Financial Resource Strain:   . Difficulty of Paying Living Expenses:   Food Insecurity:   . Worried About Charity fundraiser in the Last Year:   . Arboriculturist in the Last Year:   Transportation Needs:   . Film/video editor (Medical):   Marland Kitchen Lack of Transportation (Non-Medical):   Physical Activity:   . Days of Exercise per Week:   . Minutes  of Exercise per Session:   Stress:   . Feeling of Stress :   Social Connections:   . Frequency of Communication with Friends and Family:   . Frequency of Social Gatherings with Friends and Family:   . Attends Religious Services:   . Active Member of Clubs or Organizations:   . Attends Archivist Meetings:   Marland Kitchen Marital Status:   Intimate Partner Violence:   . Fear of Current or Ex-Partner:   . Emotionally Abused:   Marland Kitchen Physically Abused:   . Sexually Abused:     FAMILY HISTORY:    Family History  Problem Relation Age of Onset  . Aneurysm Daughter   . Stroke Daughter   . Diabetes Daughter   . Hypertension Mother   . Dementia Mother   . Hypertension Father   . Kidney cancer Brother   . Cancer Brother        brain  . Cancer Brother        bone  . Stroke Brother   . Hypertension Daughter   . Hypertension Son   . Hyperlipidemia Son   . Colon cancer Neg Hx   . Inflammatory bowel disease Neg Hx     CURRENT MEDICATIONS:  Current Outpatient Medications  Medication Sig Dispense Refill  . amLODipine (NORVASC) 10 MG tablet TAKE 1 TABLET (10 MG TOTAL) BY MOUTH DAILY. 90 tablet 3  . Ascorbic Acid (VITAMIN C) 100 MG tablet Take 100 mg by mouth daily.    Marland Kitchen aspirin 81 MG tablet Take 81 mg by mouth once a week.     . Blood Glucose Calibration (TRUE METRIX LEVEL 1) Low SOLN     . Blood Glucose Monitoring Suppl (TRUE METRIX AIR GLUCOSE METER) w/Device KIT     . Cyanocobalamin (B-12 PO) Take by mouth daily.    . hydrochlorothiazide (HYDRODIURIL) 25 MG tablet Take 25 mg by mouth daily.     Marland Kitchen lisinopril (PRINIVIL,ZESTRIL) 40 MG tablet Take 40 mg by mouth daily.     . pantoprazole (PROTONIX) 40 MG tablet Take 1 tablet (40 mg total) by mouth daily. 90 tablet 3  . pravastatin (PRAVACHOL) 20 MG tablet Take 20 mg by mouth at bedtime.    . TRUE METRIX BLOOD GLUCOSE TEST test strip     . VITAMIN D PO Take by mouth daily.    Marland Kitchen acetaminophen (TYLENOL) 500 MG tablet Take 500 mg by mouth daily as needed for headache.    . albuterol (PROAIR HFA) 108 (90 BASE) MCG/ACT inhaler Inhale 2 puffs into the lungs every 6 (six) hours as needed for wheezing or shortness of breath.    . Famotidine-Ca Carb-Mag Hydrox (PEPCID COMPLETE PO) Take by mouth as needed.    . loratadine (CLARITIN) 10 MG tablet Take 1 tablet (10 mg total) by mouth daily. (Patient not taking: Reported on 09/08/2019) 30 tablet 0  . nitroGLYCERIN (NITROSTAT) 0.4 MG SL tablet Place 1 tablet (0.4 mg total) under the tongue every 5  (five) minutes as needed for chest pain. (Patient not taking: Reported on 09/08/2019) 25 tablet 3   No current facility-administered medications for this visit.    ALLERGIES:  Allergies  Allergen Reactions  . Augmentin [Amoxicillin-Pot Clavulanate] Nausea And Vomiting    Hematemesis Has patient had a PCN reaction causing immediate rash, facial/tongue/throat swelling, SOB or lightheadedness with hypotension: Unknown Has patient had a PCN reaction causing severe rash involving mucus membranes or skin necrosis: Unknown Has patient had a PCN reaction that  required hospitalization: Yes Has patient had a PCN reaction occurring within the last 10 years: Yes If all of the above answers are "NO", then may proceed with Cephalosporin use.   Marland Kitchen Amoxicillin Other (See Comments)    Other reaction(s): GI Bleeding  . Clavulanic Acid Nausea And Vomiting  . Penicillin G Nausea And Vomiting    .Did it involve swelling of the face/tongue/throat, SOB, or low BP? No Did it involve sudden or severe rash/hives, skin peeling, or any reaction on the inside of your mouth or nose? Unknown Did you need to seek medical attention at a hospital or doctor's office? yes When did it last happen?Unknown If all above answers are "NO", may proceed with cephalosporin use.   . Sulfa Antibiotics Swelling    PHYSICAL EXAM:  Performance status (ECOG): 1 - Symptomatic but completely ambulatory  Vitals:   09/08/19 1146  BP: (!) 137/46  Pulse: (!) 53  Resp: 18  Temp: (!) 96.6 F (35.9 C)  SpO2: 98%   Wt Readings from Last 3 Encounters:  09/08/19 174 lb 12.8 oz (79.3 kg)  05/05/19 157 lb (71.2 kg)  03/08/19 175 lb 12.8 oz (79.7 kg)   Physical Exam Vitals reviewed.  Constitutional:      Appearance: Normal appearance.  Cardiovascular:     Rate and Rhythm: Normal rate and regular rhythm.     Heart sounds: Normal heart sounds.  Pulmonary:     Effort: Pulmonary effort is normal.     Breath sounds: Normal  breath sounds.  Neurological:     General: No focal deficit present.     Mental Status: She is alert and oriented to person, place, and time.  Psychiatric:        Mood and Affect: Mood normal.        Behavior: Behavior normal.     LABORATORY DATA:  I have reviewed the labs as listed.  CBC Latest Ref Rng & Units 09/01/2019 04/21/2019 12/21/2018  WBC 4.0 - 10.5 K/uL 5.6 5.5 5.5  Hemoglobin 12.0 - 15.0 g/dL 13.5 13.7 13.0  Hematocrit 36.0 - 46.0 % 43.4 43.5 41.7  Platelets 150 - 400 K/uL 191 204 185   CMP Latest Ref Rng & Units 09/01/2019 04/21/2019 12/21/2018  Glucose 70 - 99 mg/dL 102(H) 106(H) 109(H)  BUN 8 - 23 mg/dL 20 24(H) 21  Creatinine 0.44 - 1.00 mg/dL 0.92 1.03(H) 1.10(H)  Sodium 135 - 145 mmol/L 141 141 140  Potassium 3.5 - 5.1 mmol/L 3.7 3.6 3.7  Chloride 98 - 111 mmol/L 100 102 102  CO2 22 - 32 mmol/L _0 Calcium 8.9 - 10.3 mg/dL 9.7 9.6 9.2  Total Protein 6.5 - 8.1 g/dL 7.8 7.9 7.8  Total Bilirubin 0.3 - 1.2 mg/dL 0.4 0.5 0.5  Alkaline Phos 38 - 126 U/L 56 56 55  AST 15 - 41 U/L _1 ALT 0 - 44 U/L _2 Component Value Date/Time   RBC 4.86 09/01/2019 1056   MCV 89.3 09/01/2019 1056   MCH 27.8 09/01/2019 1056   MCHC 31.1 09/01/2019 1056   RDW 14.3 09/01/2019 1056   LYMPHSABS 2.5 09/01/2019 1056   MONOABS 0.5 09/01/2019 1056   EOSABS 0.1 09/01/2019 1056   BASOSABS 0.0 09/01/2019 1056    DIAGNOSTIC IMAGING:  I have independently reviewed the scans and discussed with the patient.   ASSESSMENT:  1.  IgG lambda MGUS: -Skeletal survey on 04/28/2019 with no lytic lesions. -  Labs on 09/01/2019 with M spike 0.9 g.  Free kappa light chains is 24.3, ratio of 1.66.  2.  Iron deficiency anemia: -Last Feraheme was in 06/06/2016. -CBC on 09/01/2019 was 13.5 hemoglobin.  Ferritin was 15.  B12 and folate normal. -She also has mild CKD.    PLAN:  1.  IgG lambda MGUS: -We reviewed results from 09/01/2019.  M spike slightly increased to 0.9 from 0.7  previously.  However this was higher in the past.  Light chain ratio is 1.66 and normal. -I have recommended follow-up visit in 4 to 6 months.  2.  Iron deficiency anemia: -She is no longer anemic.  Hemoglobin is 13.5.  However ferritin is 15.  B12 and folate normal.  She cannot tolerate oral iron therapy. -We will plan to repeat ferritin and iron panel at next visit.   Orders placed this encounter:  No orders of the defined types were placed in this encounter.    Derek Jack, MD [facility] [phone]   I, Jacqualyn Posey, am acting as a scribe for Dr. Sanda Linger.  I, Derek Jack MD, have reviewed the above documentation for accuracy and completeness, and I agree with the above.

## 2020-03-12 ENCOUNTER — Other Ambulatory Visit: Payer: Self-pay

## 2020-03-12 ENCOUNTER — Inpatient Hospital Stay (HOSPITAL_COMMUNITY): Payer: Medicare HMO | Attending: Hematology

## 2020-03-12 DIAGNOSIS — I10 Essential (primary) hypertension: Secondary | ICD-10-CM | POA: Insufficient documentation

## 2020-03-12 DIAGNOSIS — Z79899 Other long term (current) drug therapy: Secondary | ICD-10-CM | POA: Insufficient documentation

## 2020-03-12 DIAGNOSIS — E119 Type 2 diabetes mellitus without complications: Secondary | ICD-10-CM | POA: Insufficient documentation

## 2020-03-12 DIAGNOSIS — D472 Monoclonal gammopathy: Secondary | ICD-10-CM | POA: Diagnosis present

## 2020-03-12 DIAGNOSIS — D649 Anemia, unspecified: Secondary | ICD-10-CM | POA: Insufficient documentation

## 2020-03-12 DIAGNOSIS — D500 Iron deficiency anemia secondary to blood loss (chronic): Secondary | ICD-10-CM

## 2020-03-12 DIAGNOSIS — Z7982 Long term (current) use of aspirin: Secondary | ICD-10-CM | POA: Diagnosis not present

## 2020-03-12 LAB — FERRITIN: Ferritin: 14 ng/mL (ref 11–307)

## 2020-03-12 LAB — VITAMIN D 25 HYDROXY (VIT D DEFICIENCY, FRACTURES): Vit D, 25-Hydroxy: 36.75 ng/mL (ref 30–100)

## 2020-03-12 LAB — CBC WITH DIFFERENTIAL/PLATELET
Abs Immature Granulocytes: 0.01 10*3/uL (ref 0.00–0.07)
Basophils Absolute: 0 10*3/uL (ref 0.0–0.1)
Basophils Relative: 1 %
Eosinophils Absolute: 0.1 10*3/uL (ref 0.0–0.5)
Eosinophils Relative: 2 %
HCT: 42.1 % (ref 36.0–46.0)
Hemoglobin: 13.3 g/dL (ref 12.0–15.0)
Immature Granulocytes: 0 %
Lymphocytes Relative: 49 %
Lymphs Abs: 2.6 10*3/uL (ref 0.7–4.0)
MCH: 28.5 pg (ref 26.0–34.0)
MCHC: 31.6 g/dL (ref 30.0–36.0)
MCV: 90.1 fL (ref 80.0–100.0)
Monocytes Absolute: 0.6 10*3/uL (ref 0.1–1.0)
Monocytes Relative: 11 %
Neutro Abs: 2 10*3/uL (ref 1.7–7.7)
Neutrophils Relative %: 37 %
Platelets: 208 10*3/uL (ref 150–400)
RBC: 4.67 MIL/uL (ref 3.87–5.11)
RDW: 14.4 % (ref 11.5–15.5)
WBC: 5.3 10*3/uL (ref 4.0–10.5)
nRBC: 0 % (ref 0.0–0.2)

## 2020-03-12 LAB — COMPREHENSIVE METABOLIC PANEL
ALT: 35 U/L (ref 0–44)
AST: 33 U/L (ref 15–41)
Albumin: 4.2 g/dL (ref 3.5–5.0)
Alkaline Phosphatase: 48 U/L (ref 38–126)
Anion gap: 8 (ref 5–15)
BUN: 18 mg/dL (ref 8–23)
CO2: 28 mmol/L (ref 22–32)
Calcium: 9.5 mg/dL (ref 8.9–10.3)
Chloride: 102 mmol/L (ref 98–111)
Creatinine, Ser: 0.97 mg/dL (ref 0.44–1.00)
GFR, Estimated: 59 mL/min — ABNORMAL LOW (ref >60)
Glucose, Bld: 99 mg/dL (ref 70–99)
Potassium: 4.1 mmol/L (ref 3.5–5.1)
Sodium: 138 mmol/L (ref 135–145)
Total Bilirubin: 0.5 mg/dL (ref 0.3–1.2)
Total Protein: 7.8 g/dL (ref 6.5–8.1)

## 2020-03-12 LAB — VITAMIN B12: Vitamin B-12: 404 pg/mL (ref 180–914)

## 2020-03-12 LAB — FOLATE: Folate: 31.1 ng/mL (ref >5.9)

## 2020-03-12 LAB — IRON AND TIBC
Iron: 49 ug/dL (ref 28–170)
Saturation Ratios: 13 % (ref 10.4–31.8)
TIBC: 390 ug/dL (ref 250–450)
UIBC: 341 ug/dL

## 2020-03-12 LAB — LACTATE DEHYDROGENASE: LDH: 155 U/L (ref 98–192)

## 2020-03-12 LAB — MAGNESIUM: Magnesium: 1.8 mg/dL (ref 1.7–2.4)

## 2020-03-13 LAB — PROTEIN ELECTROPHORESIS, SERUM
A/G Ratio: 0.9 (ref 0.7–1.7)
Albumin ELP: 3.5 g/dL (ref 2.9–4.4)
Alpha-1-Globulin: 0.2 g/dL (ref 0.0–0.4)
Alpha-2-Globulin: 0.9 g/dL (ref 0.4–1.0)
Beta Globulin: 1 g/dL (ref 0.7–1.3)
Gamma Globulin: 1.6 g/dL (ref 0.4–1.8)
Globulin, Total: 3.8 g/dL (ref 2.2–3.9)
M-Spike, %: 0.9 g/dL — ABNORMAL HIGH
Total Protein ELP: 7.3 g/dL (ref 6.0–8.5)

## 2020-03-13 LAB — KAPPA/LAMBDA LIGHT CHAINS
Kappa free light chain: 23.1 mg/L — ABNORMAL HIGH (ref 3.3–19.4)
Kappa, lambda light chain ratio: 1.28 (ref 0.26–1.65)
Lambda free light chains: 18 mg/L (ref 5.7–26.3)

## 2020-03-15 ENCOUNTER — Other Ambulatory Visit: Payer: Self-pay | Admitting: *Deleted

## 2020-03-15 MED ORDER — AMLODIPINE BESYLATE 10 MG PO TABS: 10.0000 mg | ORAL_TABLET | Freq: Every day | ORAL | 0 refills | Status: DC

## 2020-03-16 NOTE — Progress Notes (Signed)
CLINIC:  Medical Oncology/Hematology  PCP:  [facility] / [place] [id number]  [phone]  REASON FOR VISIT:   Follow-up for MGUS and iron deficiency anemia.  PRIOR THERAPY: Intermittent parenteral iron therapy.  CURRENT THERAPY: Observation  INTERVAL HISTORY:   Stacey Riley, a 79 y.o. female, returns for routine follow-up for her MGUS. Denies weight loss or fevers. Denies back pain. Mild fatigue.  Denies any new onset pains. Urinating more frequently, she attributes this to hyperglycemia. No other concerns   REVIEW OF SYSTEMS:  Review of Systems  Constitutional: Negative for appetite change, chills, diaphoresis and fatigue.  HENT:   Negative for trouble swallowing.   Genitourinary: Positive for frequency.   Musculoskeletal: Negative for arthralgias.  All other systems reviewed and are negative.   PAST MEDICAL/SURGICAL HISTORY:  Past Medical History:  Diagnosis Date  . Anemia 03/14/2016  . Arthritis   . Asthma   . Bradycardia   . DM (diabetes mellitus) ([place])   . GERD (gastroesophageal reflux disease)   . High cholesterol   . HTN (hypertension)    Past Surgical History:  Procedure Laterality Date  . CHOLECYSTECTOMY  1990s  . COLONOSCOPY  03/18/1999   Dr Rourk-int  hemorrhoids, pancolonic diverticula  . COLONOSCOPY N/A 09/26/2016   Dr. Gala Romney: Diverticulosis, 4 mm polyp removed from the descending colon which is a tubular adenoma.  No future colonoscopies unless new symptoms arise.  Marland Kitchen COLONOSCOPY WITH ESOPHAGOGASTRODUODENOSCOPY (EGD)  08/06/2011   Rourk: Schatzki ring, moderate sized hiatal hernia status post dilation for history of dysphagia. Pancolonic diverticula, single diminutive polyp at the base of the cecum removed (tubular adenoma). Next colonoscopy recommended for April 2018  . ESOPHAGOGASTRODUODENOSCOPY  07/02/01   Dr Rourk-Schatzki's ring s/p 64F maloney dilation, otherwise normal/small hiatal hernia/    Linear erosion and ulceration in the proximal stomach/  The ulcerated lesion in the proximal stomach  benign.This may be a [name] lesion secondary to trauma to the mucosa, straddling the  diaphragmatic hiatus in the presence of a hiatal hernia   . ESOPHAGOGASTRODUODENOSCOPY N/A 02/14/2016   Dr. Gala Romney: Large hiatal hernia, [name] lesion likely explains bleeding.  Marland Kitchen MALONEY DILATION  08/06/2011   Procedure: Venia Minks DILATION;  Surgeon: Daneil Dolin, MD;  [facility];  Service: Endoscopy;  Laterality: N/A;  . POLYPECTOMY  09/26/2016   Procedure: POLYPECTOMY;  Surgeon: Daneil Dolin, MD;  Location: [facility];  Service: Endoscopy;;  descending colon  . TUBAL LIGATION      SOCIAL HISTORY:  Social History   Socioeconomic History  . Marital status: Divorced    Spouse name: Not on file  . Number of children: 5  . Years of education: Not on file  . Highest education level: Not on file  Occupational History  . Occupation: retired; Academic librarian  . Smoking status: Never Smoker  . Smokeless tobacco: Never Used  Vaping Use  . Vaping Use: Never used  Substance and Sexual Activity  . Alcohol use: No  . Drug use: No  . Sexual activity: Not on file  Other Topics Concern  . Not on file  Social History Narrative   Lives alone   Social Determinants of Health   Financial Resource Strain: Low Risk   . Difficulty of Paying Living Expenses: Not hard at all  Food Insecurity: No Food Insecurity  . Worried About Charity fundraiser in the Last Year: Never true  . Ran  Out of Food in the Last Year: Never true  Transportation Needs: No Transportation Needs  . Lack of Transportation (Medical): No  . Lack of Transportation (Non-Medical): No  Physical Activity: Inactive  . Days of Exercise per Week: 0 days  . Minutes of Exercise per Session: 0 min  Stress: No Stress Concern Present  . Feeling of Stress : Not at all  Social Connections: Moderately Isolated  . Frequency of  Communication with Friends and Family: More than three times a week  . Frequency of Social Gatherings with Friends and Family: Three times a week  . Attends Religious Services: 1 to 4 times per year  . Active Member of Clubs or Organizations: No  . Attends Archivist Meetings: Never  . Marital Status: Never married  Intimate Partner Violence: Not At Risk  . Fear of Current or Ex-Partner: No  . Emotionally Abused: No  . Physically Abused: No  . Sexually Abused: No    FAMILY HISTORY:  Family History  Problem Relation Age of Onset  . Aneurysm Daughter   . Stroke Daughter   . Diabetes Daughter   . Hypertension Mother   . Dementia Mother   . Hypertension Father   . Kidney cancer Brother   . Cancer Brother        brain  . Cancer Brother        bone  . Stroke Brother   . Hypertension Daughter   . Hypertension Son   . Hyperlipidemia Son   . Colon cancer Neg Hx   . Inflammatory bowel disease Neg Hx     CURRENT MEDICATIONS:  Current Outpatient Medications  Medication Sig Dispense Refill  . acetaminophen (TYLENOL) 500 MG tablet Take 500 mg by mouth daily as needed for headache.    . albuterol (PROAIR HFA) 108 (90 BASE) MCG/ACT inhaler Inhale 2 puffs into the lungs every 6 (six) hours as needed for wheezing or shortness of breath.    Marland Kitchen amLODipine (NORVASC) 10 MG tablet Take 1 tablet (10 mg total) by mouth daily. 30 tablet 0  . Ascorbic Acid (VITAMIN C) 100 MG tablet Take 100 mg by mouth daily.    Marland Kitchen aspirin 81 MG tablet Take 81 mg by mouth once a week.     . Blood Glucose Calibration (TRUE METRIX LEVEL 1) Low SOLN     . Blood Glucose Monitoring Suppl (TRUE METRIX AIR GLUCOSE METER) w/Device KIT     . Cyanocobalamin (B-12 PO) Take by mouth daily.    . Famotidine-Ca Carb-Mag Hydrox (PEPCID COMPLETE PO) Take by mouth as needed.    . hydrochlorothiazide (HYDRODIURIL) 25 MG tablet Take 25 mg by mouth daily.     Marland Kitchen lisinopril (PRINIVIL,ZESTRIL) 40 MG tablet Take 40 mg by mouth  daily.     Marland Kitchen loratadine (CLARITIN) 10 MG tablet Take 1 tablet (10 mg total) by mouth daily. 30 tablet 0  . nitroGLYCERIN (NITROSTAT) 0.4 MG SL tablet Place 1 tablet (0.4 mg total) under the tongue every 5 (five) minutes as needed for chest pain. 25 tablet 3  . pantoprazole (PROTONIX) 40 MG tablet Take 1 tablet (40 mg total) by mouth daily. 90 tablet 3  . pravastatin (PRAVACHOL) 20 MG tablet Take 20 mg by mouth at bedtime.    . TRUE METRIX BLOOD GLUCOSE TEST test strip     . VITAMIN D PO Take by mouth daily.     No current facility-administered medications for this visit.    ALLERGIES:  Allergies  Allergen Reactions  . Augmentin [Amoxicillin-Pot Clavulanate] Nausea And Vomiting    Hematemesis Has patient had a PCN reaction causing immediate rash, facial/tongue/throat swelling, SOB or lightheadedness with hypotension: Unknown Has patient had a PCN reaction causing severe rash involving mucus membranes or skin necrosis: Unknown Has patient had a PCN reaction that required hospitalization: Yes Has patient had a PCN reaction occurring within the last 10 years: Yes If all of the above answers are "NO", then may proceed with Cephalosporin use.   Marland Kitchen Amoxicillin Other (See Comments)    Other reaction(s): GI Bleeding  . Clavulanic Acid Nausea And Vomiting  . Penicillin G Nausea And Vomiting    .Did it involve swelling of the face/tongue/throat, SOB, or low BP? No Did it involve sudden or severe rash/hives, skin peeling, or any reaction on the inside of your mouth or nose? Unknown Did you need to seek medical attention at a hospital or doctor's office? yes When did it last happen?Unknown If all above answers are "NO", may proceed with cephalosporin use.   . Sulfa Antibiotics Swelling    PHYSICAL EXAM:  Performance status (ECOG): 1 - Symptomatic but completely ambulatory  Vitals:   03/19/20 1450  BP: (!) 159/54  Pulse: (!) 54  Resp: 18  Temp: (!) 97.2 F (36.2 C)  SpO2: 100%    Wt Readings from Last 3 Encounters:  03/19/20 170 lb (77.1 kg)  09/08/19 174 lb 12.8 oz (79.3 kg)  05/05/19 157 lb (71.2 kg)   Physical Exam Vitals reviewed.  Constitutional:      Appearance: Normal appearance.  Cardiovascular:     Rate and Rhythm: Normal rate and regular rhythm.     Heart sounds: Normal heart sounds.  Pulmonary:     Effort: Pulmonary effort is normal.     Breath sounds: Normal breath sounds.  Abdominal:     General: Abdomen is flat. There is no distension.     Palpations: Abdomen is soft.  Musculoskeletal:        General: No swelling.  Neurological:     General: No focal deficit present.     Mental Status: She is alert and oriented to person, place, and time.  Psychiatric:        Mood and Affect: Mood normal.        Behavior: Behavior normal.     LABORATORY DATA:  I have reviewed the labs as listed.  CBC Latest Ref Rng & Units 03/12/2020 09/01/2019 04/21/2019  WBC 4.0 - 10.5 K/uL 5.3 5.6 5.5  Hemoglobin 12.0 - 15.0 g/dL 13.3 13.5 13.7  Hematocrit 36 - 46 % 42.1 43.4 43.5  Platelets 150 - 400 K/uL 208 191 204   CMP Latest Ref Rng & Units 03/12/2020 09/01/2019 04/21/2019  Glucose 70 - 99 mg/dL 99 102(H) 106(H)  BUN 8 - 23 mg/dL 18 20 24(H)  Creatinine 0.44 - 1.00 mg/dL 0.97 0.92 1.03(H)  Sodium 135 - 145 mmol/L 138 141 141  Potassium 3.5 - 5.1 mmol/L 4.1 3.7 3.6  Chloride 98 - 111 mmol/L 102 100 102  CO2 22 - 32 mmol/L $RemoveB'28 28 30  'gwfhXejH$ Calcium 8.9 - 10.3 mg/dL 9.5 9.7 9.6  Total Protein 6.5 - 8.1 g/dL 7.8 7.8 7.9  Total Bilirubin 0.3 - 1.2 mg/dL 0.5 0.4 0.5  Alkaline Phos 38 - 126 U/L 48 56 56  AST 15 - 41 U/L 33 26 23  ALT 0 - 44 U/L 35 29 26      Component Value Date/Time  RBC 4.67 03/12/2020 1341   MCV 90.1 03/12/2020 1341   MCH 28.5 03/12/2020 1341   MCHC 31.6 03/12/2020 1341   RDW 14.4 03/12/2020 1341   LYMPHSABS 2.6 03/12/2020 1341   MONOABS 0.6 03/12/2020 1341   EOSABS 0.1 03/12/2020 1341   BASOSABS 0.0 03/12/2020 1341    DIAGNOSTIC  IMAGING:  I have independently reviewed the scans and discussed with the patient.   ASSESSMENT:  1.  IgG lambda MGUS: -Skeletal survey on 04/28/2019 with no lytic lesions. -Labs on 03/12/2020, SPEP 0.9 gm monoclonal protein essentially stable, K/L ratio normal at 1.28.  2.  Iron deficiency anemia: -Last Feraheme was in 06/06/2016. - No evidence of IDA   PLAN:  1.  IgG lambda MGUS: -We reviewed results from 03/12/2020. - SPEP reviewed, stable monoclonal protein, normal K/L ratio, CBC and CMP normal. Low risk MGUS, can consider monitoring every 6-12 months.  2.  Iron deficiency anemia: Resolved Hb of 13.3, ferritin is 31, b12 and folate normal. Encourage balanced diet.  Orders placed this encounter:  Orders Placed This Encounter  Procedures  . CBC with Differential/Platelet  . SPEP (Serum protein electrophoresis)  . Kappa/lambda light chains  . IgG, IgA, IgM  . Comprehensive metabolic panel   Benay Pike MD

## 2020-03-19 ENCOUNTER — Encounter (HOSPITAL_COMMUNITY): Payer: Self-pay | Admitting: Hematology and Oncology

## 2020-03-19 ENCOUNTER — Inpatient Hospital Stay (HOSPITAL_COMMUNITY): Payer: Medicare HMO | Attending: Hematology and Oncology | Admitting: Hematology and Oncology

## 2020-03-19 ENCOUNTER — Other Ambulatory Visit: Payer: Self-pay

## 2020-03-19 VITALS — BP 159/54 | HR 54 | Temp 97.2°F | Resp 18 | Wt 170.0 lb

## 2020-03-19 DIAGNOSIS — Z8249 Family history of ischemic heart disease and other diseases of the circulatory system: Secondary | ICD-10-CM | POA: Insufficient documentation

## 2020-03-19 DIAGNOSIS — E7800 Pure hypercholesterolemia, unspecified: Secondary | ICD-10-CM | POA: Diagnosis not present

## 2020-03-19 DIAGNOSIS — Z808 Family history of malignant neoplasm of other organs or systems: Secondary | ICD-10-CM | POA: Diagnosis not present

## 2020-03-19 DIAGNOSIS — Z79899 Other long term (current) drug therapy: Secondary | ICD-10-CM | POA: Insufficient documentation

## 2020-03-19 DIAGNOSIS — Z7982 Long term (current) use of aspirin: Secondary | ICD-10-CM | POA: Diagnosis not present

## 2020-03-19 DIAGNOSIS — K219 Gastro-esophageal reflux disease without esophagitis: Secondary | ICD-10-CM | POA: Diagnosis not present

## 2020-03-19 DIAGNOSIS — I10 Essential (primary) hypertension: Secondary | ICD-10-CM | POA: Insufficient documentation

## 2020-03-19 DIAGNOSIS — D509 Iron deficiency anemia, unspecified: Secondary | ICD-10-CM | POA: Diagnosis not present

## 2020-03-19 DIAGNOSIS — Z8349 Family history of other endocrine, nutritional and metabolic diseases: Secondary | ICD-10-CM | POA: Insufficient documentation

## 2020-03-19 DIAGNOSIS — D472 Monoclonal gammopathy: Secondary | ICD-10-CM | POA: Diagnosis not present

## 2020-03-19 DIAGNOSIS — Z8051 Family history of malignant neoplasm of kidney: Secondary | ICD-10-CM | POA: Insufficient documentation

## 2020-03-19 DIAGNOSIS — Z833 Family history of diabetes mellitus: Secondary | ICD-10-CM | POA: Insufficient documentation

## 2020-03-19 DIAGNOSIS — E1165 Type 2 diabetes mellitus with hyperglycemia: Secondary | ICD-10-CM | POA: Insufficient documentation

## 2020-03-19 DIAGNOSIS — J45909 Unspecified asthma, uncomplicated: Secondary | ICD-10-CM | POA: Insufficient documentation

## 2020-05-23 ENCOUNTER — Other Ambulatory Visit: Payer: Self-pay | Admitting: Student

## 2020-07-16 ENCOUNTER — Encounter: Payer: Self-pay | Admitting: Internal Medicine

## 2020-09-11 ENCOUNTER — Other Ambulatory Visit: Payer: Self-pay

## 2020-09-11 ENCOUNTER — Ambulatory Visit: Payer: Medicare HMO | Admitting: Internal Medicine

## 2020-09-11 VITALS — BP 139/57 | HR 51 | Temp 96.8°F | Ht 64.0 in | Wt 167.2 lb

## 2020-09-11 DIAGNOSIS — K449 Diaphragmatic hernia without obstruction or gangrene: Secondary | ICD-10-CM

## 2020-09-11 DIAGNOSIS — K219 Gastro-esophageal reflux disease without esophagitis: Secondary | ICD-10-CM

## 2020-09-11 NOTE — Patient Instructions (Signed)
You are doing well from a GI standpoint these days  Continue taking Protonix 40 mg each morning  May use Pepcid Complete as needed for breakthrough symptoms  Might try reflux gourmet -over-the-counter remedy for breakthrough reflux symptoms -obtain on [email or web address]  No need for colonoscopy or upper endoscopy at this time  Plan to see you back in 1 year and as needed.

## 2020-09-11 NOTE — Progress Notes (Signed)
Primary Care Physician:  [facility] Primary Gastroenterologist:  Dr. Jena Gauss  Pre-Procedure History & Physical: HPI:  Stacey Riley is a 80 y.o. female here for follow-up of GERD/large hiatal hernia history of anemia in the past felt to be due to chronic GI blood loss from stomach.  Last CBC on record back in November was completely normal.  Patient having no dysphagia, nausea or vomiting; appetite well-maintained.  She is taking Protonix 40 mg daily.  She may take Pepcid Complete if she eats something that she knows she is going to have trouble with such as fried foods.  Past Medical History:  Diagnosis Date  . Anemia 03/14/2016  . Arthritis   . Asthma   . Bradycardia   . DM (diabetes mellitus) ([facility])   . GERD (gastroesophageal reflux disease)   . High cholesterol   . HTN (hypertension)     Past Surgical History:  Procedure Laterality Date  . CHOLECYSTECTOMY  1990s  . COLONOSCOPY  03/18/1999   Dr Pebbles Zeiders-int  hemorrhoids, pancolonic diverticula  . COLONOSCOPY N/A 09/26/2016   Dr. Jena Gauss: Diverticulosis, 4 mm polyp removed from the descending colon which is a tubular adenoma.  No future colonoscopies unless new symptoms arise.  Marland Kitchen COLONOSCOPY WITH ESOPHAGOGASTRODUODENOSCOPY (EGD)  08/06/2011   Elzabeth Mcquerry: Schatzki ring, moderate sized hiatal hernia status post dilation for history of dysphagia. Pancolonic diverticula, single diminutive polyp at the base of the cecum removed (tubular adenoma). Next colonoscopy recommended for April 2018  . ESOPHAGOGASTRODUODENOSCOPY  07/02/01   Dr Vlasta Baskin-Schatzki's ring s/p 45F maloney dilation, otherwise normal/small hiatal hernia/   Linear erosion and ulceration in the proximal stomach/  The ulcerated lesion in the proximal stomach  benign.This may be a [name] lesion secondary to trauma to the mucosa, straddling the  diaphragmatic hiatus in the presence of a hiatal hernia   . ESOPHAGOGASTRODUODENOSCOPY N/A 02/14/2016   Dr. Jena Gauss:  Large hiatal hernia, [name] lesion likely explains bleeding.  Marland Kitchen MALONEY DILATION  08/06/2011   Procedure: [name] DILATION;  Surgeon: [name], MD;  [facility];  Service: Endoscopy;  Laterality: N/A;  . POLYPECTOMY  09/26/2016   Procedure: POLYPECTOMY;  Surgeon: [name], MD;  [facility];  Service: Endoscopy;;  descending colon  . TUBAL LIGATION      Prior to Admission medications   Medication Sig Start Date End Date Taking? Authorizing Provider  acetaminophen (TYLENOL) 500 MG tablet Take 500 mg by mouth daily as needed for headache.   Yes [provider]  albuterol (VENTOLIN HFA) 108 (90 Base) MCG/ACT inhaler Inhale 2 puffs into the lungs every 6 (six) hours as needed for wheezing or shortness of breath.   Yes [provider]  amLODipine (NORVASC) 10 MG tablet Take 1 tablet (10 mg total) by mouth daily. 03/15/20 06/13/20 Yes Strader, Lennart Pall, PA-C  Ascorbic Acid (VITAMIN C) 100 MG tablet Take 100 mg by mouth daily.   Yes [provider]  aspirin 81 MG tablet Take 81 mg by mouth once a week.    Yes [provider]  Blood Glucose Calibration (TRUE METRIX LEVEL 1) Low SOLN  10/29/18  Yes [provider]  Blood Glucose Monitoring Suppl (TRUE METRIX AIR GLUCOSE METER) w/Device KIT  10/27/18  Yes [provider]  Cyanocobalamin (B-12 PO) Take by mouth daily.   Yes [provider]  Famotidine-Ca Carb-Mag Hydrox (PEPCID COMPLETE PO) Take by mouth as needed.   Yes [provider]  hydrochlorothiazide (HYDRODIURIL) 25 MG tablet Take 25 mg by mouth daily. 04/22/11  Yes [provider]  lisinopril (PRINIVIL,ZESTRIL) 40 MG tablet Take 40 mg by mouth daily. 04/22/11  Yes [provider]  loratadine (CLARITIN) 10 MG tablet Take 1 tablet (10 mg total) by mouth daily. 02/16/16  Yes Thurnell Lose, MD  nitroGLYCERIN (NITROSTAT) 0.4 MG SL tablet Place 1 tablet (0.4 mg total) under the  tongue every 5 (five) minutes as needed for chest pain. 11/19/17  Yes Strader, [place] M, PA-C  pantoprazole (PROTONIX) 40 MG tablet Take 1 tablet (40 mg total) by mouth daily. 06/21/18  Yes Herminio Commons, MD  pravastatin (PRAVACHOL) 20 MG tablet Take 20 mg by mouth at bedtime.   Yes [provider]  TRUE METRIX BLOOD GLUCOSE TEST test strip  06/07/18  Yes [provider]  VITAMIN D PO Take by mouth daily.   Yes [provider]    Allergies as of 09/11/2020 - Review Complete 09/11/2020  Allergen Reaction Noted  . Augmentin [amoxicillin-pot clavulanate] Nausea And Vomiting 02/14/2016  . Amoxicillin Other (See Comments) 05/05/2019  . Clavulanic acid Nausea And Vomiting 07/08/2017  . Penicillin g Nausea And Vomiting 07/08/2017  . Sulfa antibiotics Swelling 07/14/2011    Family History  Problem Relation Age of Onset  . Aneurysm Daughter   . Stroke Daughter   . Diabetes Daughter   . Hypertension Mother   . Dementia Mother   . Hypertension Father   . Kidney cancer Brother   . Cancer Brother        brain  . Cancer Brother        bone  . Stroke Brother   . Hypertension Daughter   . Hypertension Son   . Hyperlipidemia Son   . Colon cancer Neg Hx   . Inflammatory bowel disease Neg Hx     Social History   Socioeconomic History  . Marital status: Divorced    Spouse name: Not on file  . Number of children: 5  . Years of education: Not on file  . Highest education level: Not on file  Occupational History  . Occupation: retired; Academic librarian  . Smoking status: Never Smoker  . Smokeless tobacco: Never Used  Vaping Use  . Vaping Use: Never used  Substance and Sexual Activity  . Alcohol use: No  . Drug use: No  . Sexual activity: Not on file  Other Topics Concern  . Not on file  Social History Narrative   Lives alone   Social Determinants of Health   Financial Resource Strain: Low Risk   . Difficulty of Paying Living Expenses: Not  hard at all  Food Insecurity: No Food Insecurity  . Worried About Charity fundraiser in the Last Year: Never true  . Ran Out of Food in the Last Year: Never true  Transportation Needs: No Transportation Needs  . Lack of Transportation (Medical): No  . Lack of Transportation (Non-Medical): No  Physical Activity: Inactive  . Days of Exercise per Week: 0 days  . Minutes of Exercise per Session: 0 min  Stress: No Stress Concern Present  . Feeling of Stress : Not at all  Social Connections: Moderately Isolated  . Frequency of Communication with Friends and Family: More than three times a week  . Frequency of Social Gatherings with Friends and Family: Three times a week  . Attends Religious Services: 1 to 4 times per year  . Active  Member of Clubs or Organizations: No  . Attends Archivist Meetings: Never  . Marital Status: Never married  Intimate Partner Violence: Not At Risk  . Fear of Current or Ex-Partner: No  . Emotionally Abused: No  . Physically Abused: No  . Sexually Abused: No    Review of Systems: See HPI, otherwise negative ROS  Physical Exam: BP (!) 139/57   Pulse (!) 51   Temp (!) 96.8 F (36 C) (Temporal)   Ht $R'5\' 4"'vV$  (1.626 m)   Wt 167 lb 3.2 oz (75.8 kg)   BMI 28.70 kg/m  General:   Alert,  Well-developed, well-nourished, pleasant and cooperative in NAD Abdomen: Non-distended, normal bowel sounds.  Soft and nontender without appreciable mass or hepatosplenomegaly.  Pulses:  Normal pulses noted. Extremities:  Without clubbing or edema.  Impression/Plan: Pleasant 80 year old lady with with GERD fairly well controlled on Protonix 40 mg daily.  No alarm symptoms.  Occasional breakthrough symptoms treated with antacid/H2 blocker therapy.  Overall, she is doing well from a GI standpoint.  No further evaluation warranted.   Recommendations:  Continue taking Protonix 40 mg each morning  May use Pepcid Complete as needed for breakthrough symptoms  Might  try reflux gourmet -over-the-counter remedy for breakthrough reflux symptoms -obtain on [email or web address]  No need for colonoscopy or upper endoscopy at this time  Plan for 1 year and as needed.   Notice: This dictation was prepared with Dragon dictation along with smaller phrase technology. Any transcriptional errors that result from this process are unintentional and may not be corrected upon review.

## 2020-09-24 ENCOUNTER — Inpatient Hospital Stay (HOSPITAL_COMMUNITY): Payer: Medicare HMO | Attending: Hematology

## 2020-09-24 ENCOUNTER — Other Ambulatory Visit: Payer: Self-pay

## 2020-09-24 DIAGNOSIS — Z79899 Other long term (current) drug therapy: Secondary | ICD-10-CM | POA: Diagnosis not present

## 2020-09-24 DIAGNOSIS — E7800 Pure hypercholesterolemia, unspecified: Secondary | ICD-10-CM | POA: Diagnosis not present

## 2020-09-24 DIAGNOSIS — E119 Type 2 diabetes mellitus without complications: Secondary | ICD-10-CM | POA: Insufficient documentation

## 2020-09-24 DIAGNOSIS — D509 Iron deficiency anemia, unspecified: Secondary | ICD-10-CM | POA: Insufficient documentation

## 2020-09-24 DIAGNOSIS — J45909 Unspecified asthma, uncomplicated: Secondary | ICD-10-CM | POA: Diagnosis not present

## 2020-09-24 DIAGNOSIS — I10 Essential (primary) hypertension: Secondary | ICD-10-CM | POA: Insufficient documentation

## 2020-09-24 DIAGNOSIS — D472 Monoclonal gammopathy: Secondary | ICD-10-CM | POA: Insufficient documentation

## 2020-09-24 LAB — COMPREHENSIVE METABOLIC PANEL
ALT: 28 U/L (ref 0–44)
AST: 26 U/L (ref 15–41)
Albumin: 4.1 g/dL (ref 3.5–5.0)
Alkaline Phosphatase: 51 U/L (ref 38–126)
Anion gap: 8 (ref 5–15)
BUN: 26 mg/dL — ABNORMAL HIGH (ref 8–23)
CO2: 29 mmol/L (ref 22–32)
Calcium: 9.6 mg/dL (ref 8.9–10.3)
Chloride: 101 mmol/L (ref 98–111)
Creatinine, Ser: 1.16 mg/dL — ABNORMAL HIGH (ref 0.44–1.00)
GFR, Estimated: 48 mL/min — ABNORMAL LOW (ref >60)
Glucose, Bld: 109 mg/dL — ABNORMAL HIGH (ref 70–99)
Potassium: 4 mmol/L (ref 3.5–5.1)
Sodium: 138 mmol/L (ref 135–145)
Total Bilirubin: 0.5 mg/dL (ref 0.3–1.2)
Total Protein: 7.7 g/dL (ref 6.5–8.1)

## 2020-09-24 LAB — CBC WITH DIFFERENTIAL/PLATELET
Abs Immature Granulocytes: 0.01 10*3/uL (ref 0.00–0.07)
Basophils Absolute: 0 10*3/uL (ref 0.0–0.1)
Basophils Relative: 1 %
Eosinophils Absolute: 0.1 10*3/uL (ref 0.0–0.5)
Eosinophils Relative: 2 %
HCT: 42.4 % (ref 36.0–46.0)
Hemoglobin: 13.2 g/dL (ref 12.0–15.0)
Immature Granulocytes: 0 %
Lymphocytes Relative: 50 %
Lymphs Abs: 2.8 10*3/uL (ref 0.7–4.0)
MCH: 28.1 pg (ref 26.0–34.0)
MCHC: 31.1 g/dL (ref 30.0–36.0)
MCV: 90.2 fL (ref 80.0–100.0)
Monocytes Absolute: 0.5 10*3/uL (ref 0.1–1.0)
Monocytes Relative: 9 %
Neutro Abs: 2.1 10*3/uL (ref 1.7–7.7)
Neutrophils Relative %: 38 %
Platelets: 182 10*3/uL (ref 150–400)
RBC: 4.7 MIL/uL (ref 3.87–5.11)
RDW: 14.6 % (ref 11.5–15.5)
WBC: 5.6 10*3/uL (ref 4.0–10.5)
nRBC: 0 % (ref 0.0–0.2)

## 2020-09-25 LAB — PROTEIN ELECTROPHORESIS, SERUM
A/G Ratio: 1.2 (ref 0.7–1.7)
Albumin ELP: 4 g/dL (ref 2.9–4.4)
Alpha-1-Globulin: 0.2 g/dL (ref 0.0–0.4)
Alpha-2-Globulin: 0.8 g/dL (ref 0.4–1.0)
Beta Globulin: 0.9 g/dL (ref 0.7–1.3)
Gamma Globulin: 1.4 g/dL (ref 0.4–1.8)
Globulin, Total: 3.4 g/dL (ref 2.2–3.9)
M-Spike, %: 0.7 g/dL — ABNORMAL HIGH
Total Protein ELP: 7.4 g/dL (ref 6.0–8.5)

## 2020-09-25 LAB — IGG, IGA, IGM
IgA: 118 mg/dL (ref 64–422)
IgG (Immunoglobin G), Serum: 1516 mg/dL (ref 586–1602)
IgM (Immunoglobulin M), Srm: 64 mg/dL (ref 26–217)

## 2020-09-25 LAB — KAPPA/LAMBDA LIGHT CHAINS
Kappa free light chain: 24.3 mg/L — ABNORMAL HIGH (ref 3.3–19.4)
Kappa, lambda light chain ratio: 1.32 (ref 0.26–1.65)
Lambda free light chains: 18.4 mg/L (ref 5.7–26.3)

## 2020-10-01 ENCOUNTER — Encounter (HOSPITAL_COMMUNITY): Payer: Self-pay | Admitting: Physician Assistant

## 2020-10-01 ENCOUNTER — Inpatient Hospital Stay (HOSPITAL_COMMUNITY): Payer: Medicare HMO | Admitting: Physician Assistant

## 2020-10-01 ENCOUNTER — Ambulatory Visit (HOSPITAL_COMMUNITY): Payer: Medicare HMO | Admitting: Hematology

## 2020-10-01 ENCOUNTER — Other Ambulatory Visit: Payer: Self-pay

## 2020-10-01 VITALS — BP 140/52 | HR 53 | Temp 96.6°F | Resp 18 | Wt 166.4 lb

## 2020-10-01 DIAGNOSIS — D472 Monoclonal gammopathy: Secondary | ICD-10-CM | POA: Diagnosis not present

## 2020-10-01 DIAGNOSIS — D500 Iron deficiency anemia secondary to blood loss (chronic): Secondary | ICD-10-CM | POA: Diagnosis not present

## 2020-10-01 NOTE — Progress Notes (Signed)
[facility] [address], [place] [address]   CLINIC:  Medical Oncology/Hematology  PCP:  [facility] [place] [address] [phone]   REASON FOR VISIT:  Follow-up for MGUS and iron deficiency anemia  PRIOR THERAPY: Intermittent parenteral iron therapy  CURRENT THERAPY: Observation  INTERVAL HISTORY:  [name] 80 y.o. female returns for routine follow-up of her MGUS and iron deficiency anemia.  She was last seen in office by Dr. Chryl Heck on 03/19/2020.  Patient reports she has been doing well since her last visit.  Denies any recent fevers, infections, or hospitalizations.  No new diagnoses or changes in her baseline health status.  She reports some new onset left lower extremity pain, which she states extends from her left knee to her left ankle and is worse when she is at rest.  It is associated with some new left leg paresthesias that have been intermittent for the past 2 to 3 months.  No other new bone pains or fractures.  She denies any B symptoms such as fever, chills, night sweats, unintentional weight loss. She has not noticed any recent bleeding such as epistaxis, hematuria, hematemesis, melena, or hematochezia.  Denies recent chest pain on exertion, shortness of breath on minimal exertion, pre-syncopal episodes, or palpitations.  Denies any nausea, vomiting, or diarrhea.  No new neurologic symptoms such as tinnitus, new-onset hearing loss, blurred vision, headache, or dizziness.  No thromboembolic events since her last visit.  No new masses or lymphadenopathy per her report.   She has 75% energy and 100% appetite. She endorses that she is maintaining a stable weight.    REVIEW OF SYSTEMS:  Review of Systems  Constitutional:  Positive for fatigue (energy 75%). Negative for appetite change, chills, diaphoresis, fever and unexpected weight change.  HENT:   Negative for lump/mass and nosebleeds.   Eyes:   Negative for eye problems.  Respiratory:  Negative for cough, hemoptysis and shortness of breath.   Cardiovascular:  Positive for leg swelling (left ankle swelling). Negative for chest pain and palpitations.  Gastrointestinal:  Negative for abdominal pain, blood in stool, constipation, diarrhea, nausea and vomiting.       Acid reflux  Genitourinary:  Negative for hematuria.   Skin: Negative.   Neurological:  Positive for numbness (left leg tingling). Negative for dizziness, headaches and light-headedness.  Hematological:  Does not bruise/bleed easily.     PAST MEDICAL/SURGICAL HISTORY:  Past Medical History:  Diagnosis Date   Anemia 03/14/2016   Arthritis    Asthma    Bradycardia    DM (diabetes mellitus) (HCC)    GERD (gastroesophageal reflux disease)    High cholesterol    HTN (hypertension)    Past Surgical History:  Procedure Laterality Date   CHOLECYSTECTOMY  1990s   COLONOSCOPY  03/18/1999   Dr Rourk-int  hemorrhoids, pancolonic diverticula   COLONOSCOPY N/A 09/26/2016   Dr. Gala Romney: Diverticulosis, 4 mm polyp removed from the descending colon which is a tubular adenoma.  No future colonoscopies unless new symptoms arise.   COLONOSCOPY WITH ESOPHAGOGASTRODUODENOSCOPY (EGD)  08/06/2011   Rourk: Schatzki ring, moderate sized hiatal hernia status post dilation for history of dysphagia. Pancolonic diverticula, single diminutive polyp at the base of the cecum removed (tubular adenoma). Next colonoscopy recommended for April 2018   ESOPHAGOGASTRODUODENOSCOPY  07/02/01   Dr Rourk-Schatzki's ring s/p 56F maloney dilation, otherwise normal/small hiatal hernia/   Linear erosion and ulceration in the proximal stomach/  The ulcerated lesion in the proximal stomach  benign.This may be a [name] lesion secondary to trauma to the mucosa, straddling the  diaphragmatic hiatus in the presence of a hiatal hernia    ESOPHAGOGASTRODUODENOSCOPY N/A 02/14/2016   Dr. Gala Romney: Large hiatal hernia, [name]  lesion likely explains bleeding.   MALONEY DILATION  08/06/2011   Procedure: Venia Minks DILATION;  Surgeon: Daneil Dolin, MD;  Location: [facility];  Service: Endoscopy;  Laterality: N/A;   POLYPECTOMY  09/26/2016   Procedure: POLYPECTOMY;  Surgeon: Daneil Dolin, MD;  Location: [facility];  Service: Endoscopy;;  descending colon   TUBAL LIGATION       SOCIAL HISTORY:  Social History   Socioeconomic History   Marital status: Divorced    Spouse name: Not on file   Number of children: 5   Years of education: Not on file   Highest education level: Not on file  Occupational History   Occupation: retired; Charity fundraiser  Tobacco Use   Smoking status: Never   Smokeless tobacco: Never  Vaping Use   Vaping Use: Never used  Substance and Sexual Activity   Alcohol use: No   Drug use: No   Sexual activity: Not on file  Other Topics Concern   Not on file  Social History Narrative   Lives alone   Social Determinants of Health   Financial Resource Strain: Low Risk    Difficulty of Paying Living Expenses: Not hard at all  Food Insecurity: No Food Insecurity   Worried About Charity fundraiser in the Last Year: Never true   [place] in the Last Year: Never true  Transportation Needs: No Transportation Needs   Lack of Transportation (Medical): No   Lack of Transportation (Non-Medical): No  Physical Activity: Inactive   Days of Exercise per Week: 0 days   Minutes of Exercise per Session: 0 min  Stress: No Stress Concern Present   Feeling of Stress : Not at all  Social Connections: Moderately Isolated   Frequency of Communication with Friends and Family: More than three times a week   Frequency of Social Gatherings with Friends and Family: Three times a week   Attends Religious Services: 1 to 4 times per year   Active Member of Clubs or Organizations: No   Attends Music therapist: Never   Marital Status: Never married  Human resources officer Violence: Not At Risk    Fear of Current or Ex-Partner: No   Emotionally Abused: No   Physically Abused: No   Sexually Abused: No    FAMILY HISTORY:  Family History  Problem Relation Age of Onset   Aneurysm Daughter    Stroke Daughter    Diabetes Daughter    Hypertension Mother    Dementia Mother    Hypertension Father    Kidney cancer Brother    Cancer Brother        brain   Cancer Brother        bone   Stroke Brother    Hypertension Daughter    Hypertension Son    Hyperlipidemia Son    Colon cancer Neg Hx    Inflammatory bowel disease Neg Hx     CURRENT MEDICATIONS:  Outpatient Encounter Medications as of 10/01/2020  Medication Sig   acetaminophen (TYLENOL) 500 MG tablet Take 500 mg by mouth daily as needed for headache.   albuterol (VENTOLIN HFA) 108 (90 Base) MCG/ACT inhaler Inhale 2 puffs into the lungs every 6 (  six) hours as needed for wheezing or shortness of breath.   amLODipine (NORVASC) 10 MG tablet Take 1 tablet (10 mg total) by mouth daily.   Ascorbic Acid (VITAMIN C) 100 MG tablet Take 100 mg by mouth daily.   aspirin 81 MG tablet Take 81 mg by mouth once a week.    Blood Glucose Calibration (TRUE METRIX LEVEL 1) Low SOLN    Blood Glucose Monitoring Suppl (TRUE METRIX AIR GLUCOSE METER) w/Device KIT    Cyanocobalamin (B-12 PO) Take by mouth daily.   Famotidine-Ca Carb-Mag Hydrox (PEPCID COMPLETE PO) Take by mouth as needed.   hydrochlorothiazide (HYDRODIURIL) 25 MG tablet Take 25 mg by mouth daily.   lisinopril (PRINIVIL,ZESTRIL) 40 MG tablet Take 40 mg by mouth daily.   loratadine (CLARITIN) 10 MG tablet Take 1 tablet (10 mg total) by mouth daily.   nitroGLYCERIN (NITROSTAT) 0.4 MG SL tablet Place 1 tablet (0.4 mg total) under the tongue every 5 (five) minutes as needed for chest pain.   pantoprazole (PROTONIX) 40 MG tablet Take 1 tablet (40 mg total) by mouth daily.   pravastatin (PRAVACHOL) 20 MG tablet Take 20 mg by mouth at bedtime.   TRUE METRIX BLOOD GLUCOSE TEST test strip     VITAMIN D PO Take by mouth daily.   No facility-administered encounter medications on file as of 10/01/2020.    ALLERGIES:  Allergies  Allergen Reactions   Augmentin [Amoxicillin-Pot Clavulanate] Nausea And Vomiting    Hematemesis Has patient had a PCN reaction causing immediate rash, facial/tongue/throat swelling, SOB or lightheadedness with hypotension: Unknown Has patient had a PCN reaction causing severe rash involving mucus membranes or skin necrosis: Unknown Has patient had a PCN reaction that required hospitalization: Yes Has patient had a PCN reaction occurring within the last 10 years: Yes If all of the above answers are "NO", then may proceed with Cephalosporin use.    Amoxicillin Other (See Comments)    Other reaction(s): GI Bleeding   Clavulanic Acid Nausea And Vomiting   Penicillin G Nausea And Vomiting    .Did it involve swelling of the face/tongue/throat, SOB, or low BP? No Did it involve sudden or severe rash/hives, skin peeling, or any reaction on the inside of your mouth or nose? Unknown Did you need to seek medical attention at a hospital or doctor's office? yes When did it last happen? Unknown      If all above answers are "NO", may proceed with cephalosporin use.    Sulfa Antibiotics Swelling     PHYSICAL EXAM:  ECOG PERFORMANCE STATUS: 0 - Asymptomatic  There were no vitals filed for this visit. There were no vitals filed for this visit. Physical Exam Constitutional:      Appearance: Normal appearance. She is obese.  HENT:     Head: Normocephalic and atraumatic.     Mouth/Throat:     Mouth: Mucous membranes are moist.  Eyes:     Extraocular Movements: Extraocular movements intact.     Pupils: Pupils are equal, round, and reactive to light.  Cardiovascular:     Rate and Rhythm: Normal rate and regular rhythm.     Pulses: Normal pulses.     Heart sounds: Normal heart sounds.  Pulmonary:     Effort: Pulmonary effort is normal.     Breath sounds:  Normal breath sounds.  Abdominal:     General: Bowel sounds are normal.     Palpations: Abdomen is soft.     Tenderness: There is no  abdominal tenderness.  Musculoskeletal:        General: No swelling.     Right lower leg: No edema.     Left lower leg: No edema.  Lymphadenopathy:     Cervical: No cervical adenopathy.  Skin:    General: Skin is warm and dry.  Neurological:     General: No focal deficit present.     Mental Status: She is alert and oriented to person, place, and time.  Psychiatric:        Mood and Affect: Mood normal.        Behavior: Behavior normal.     LABORATORY DATA:  I have reviewed the labs as listed.  CBC    Component Value Date/Time   WBC 5.6 09/24/2020 1254   RBC 4.70 09/24/2020 1254   HGB 13.2 09/24/2020 1254   HCT 42.4 09/24/2020 1254   PLT 182 09/24/2020 1254   MCV 90.2 09/24/2020 1254   MCH 28.1 09/24/2020 1254   MCHC 31.1 09/24/2020 1254   RDW 14.6 09/24/2020 1254   LYMPHSABS 2.8 09/24/2020 1254   MONOABS 0.5 09/24/2020 1254   EOSABS 0.1 09/24/2020 1254   BASOSABS 0.0 09/24/2020 1254   CMP Latest Ref Rng & Units 09/24/2020 03/12/2020 09/01/2019  Glucose 70 - 99 mg/dL 109(H) 99 102(H)  BUN 8 - 23 mg/dL 26(H) 18 20  Creatinine 0.44 - 1.00 mg/dL 1.16(H) 0.97 0.92  Sodium 135 - 145 mmol/L 138 138 141  Potassium 3.5 - 5.1 mmol/L 4.0 4.1 3.7  Chloride 98 - 111 mmol/L 101 102 100  CO2 22 - 32 mmol/L 29 28 28   Calcium 8.9 - 10.3 mg/dL 9.6 9.5 9.7  Total Protein 6.5 - 8.1 g/dL 7.7 7.8 7.8  Total Bilirubin 0.3 - 1.2 mg/dL 0.5 0.5 0.4  Alkaline Phos 38 - 126 U/L 51 48 56  AST 15 - 41 U/L 26 33 26  ALT 0 - 44 U/L 28 35 29    DIAGNOSTIC IMAGING:  I have independently reviewed the relevant imaging and discussed with the patient.  ASSESSMENT: 1.  IgG lambda MGUS: -Skeletal survey on 04/28/2019 with no lytic lesions. - Labs on 09/24/2020: SPEP with stable M spike 0.7; light chains stable with mildly elevated kappa 24.3, normal lambda 18.4,  normal ratio 1.32 - No CRAB features: Hgb 13.2, creatinine 1.16, calcium 9.6  2.  Iron deficiency anemia: - Last Feraheme was in 06/06/2016. - No evidence of IDA, Hgb normal at 13.2 with MCV 90.2    PLAN:  1.  IgG lambda MGUS: - We reviewed results from 09/24/2020 - SPEP reviewed, stable monoclonal protein, normal K/L ratio, CBC and CMP normal. - Low risk MGUS, can consider monitoring every 6-12 months. - Check skeletal survey today - will call with any abnormal results (due for annual survey, also experiencing new left lower extremity pain) - RTC in 6 months with repeat myeloma/MGUS panel.     2.  Iron deficiency anemia: - Resolved, no acute issues at this time - Encourage balanced diet. - RTC in 6 months with repeat iron panel and CBC   PLAN SUMMARY & DISPOSITION: - Check skeletal survey today - will call with any abnormal results (due for annual survey, also experiencing new left lower extremity pain) -RTC in 6 months with labs the week before (iron panel, MGUS panel)  All questions were answered. The patient knows to call the clinic with any problems, questions or concerns.  Medical decision making: Low  Time spent on visit:  I spent 15 minutes counseling the patient face to face. The total time spent in the appointment was 25 minutes and more than 50% was on counseling.   Harriett Rush, PA-C  10/01/20 12:47 PM

## 2020-10-01 NOTE — Patient Instructions (Signed)
[place] [facility] Discharge Instructions  You were seen today by Tarri Abernethy PA-C for your anemia and abnormal protein (MGUS).  Your abnormal protein levels are about the same, which is a good sign.  You do continue to have about a 1% per year chance of progression to a type of blood cancer called multiple myeloma, which is why we will continue to monitor your blood and protein levels.  Your anemia is currently well-controlled, and your blood levels are normal!    LABS: Return in 6 months for labs   OTHER TESTS: Whole-body Xrays this week - we will call you if there are any abnormal results.  MEDICATIONS: No changes  FOLLOW-UP APPOINTMENT: Follow up visit in 6 months   Thank you for choosing [place] at [facility] to provide your oncology and hematology care.  To afford each patient quality time with our provider, please arrive at least 15 minutes before your scheduled appointment time.   If you have a lab appointment with the [facility] please come in thru the Main Entrance and check in at the main information desk.  You need to re-schedule your appointment should you arrive 10 or more minutes late.  We strive to give you quality time with our providers, and arriving late affects you and other patients whose appointments are after yours.  Also, if you no show three or more times for appointments you may be dismissed from the clinic at the providers discretion.     Again, thank you for choosing [facility].  Our hope is that these requests will decrease the amount of time that you wait before being seen by our physicians.       _____________________________________________________________  Should you have questions after your visit to [facility], please contact our office at [phone] and follow the prompts.  Our office hours are 8:00 a.m. and 4:30 p.m. Monday - Friday.  Please note that  voicemails left after 4:00 p.m. may not be returned until the following business day.  We are closed weekends and major holidays.  You do have access to a nurse 24-7, just call the main number to the clinic [phone] and do not press any options, hold on the line and a nurse will answer the phone.    For prescription refill requests, have your pharmacy contact our office and allow 72 hours.    Due to Covid, you will need to wear a mask upon entering the hospital. If you do not have a mask, a mask will be given to you at the Main Entrance upon arrival. For doctor visits, patients may have 1 support person age 8 or older with them. For treatment visits, patients can not have anyone with them due to social distancing guidelines and our immunocompromised population.

## 2020-10-02 ENCOUNTER — Ambulatory Visit (HOSPITAL_COMMUNITY)
Admission: RE | Admit: 2020-10-02 | Discharge: 2020-10-02 | Disposition: A | Discharge status: Home / Self Care | Payer: Medicare HMO | Source: Ambulatory Visit | Attending: Physician Assistant | Admitting: Physician Assistant

## 2020-10-02 DIAGNOSIS — D472 Monoclonal gammopathy: Secondary | ICD-10-CM | POA: Insufficient documentation

## 2020-10-05 ENCOUNTER — Encounter (HOSPITAL_COMMUNITY): Payer: Self-pay

## 2020-10-05 NOTE — Progress Notes (Signed)
Note from Stacey Riley, [place]- I saw Stacey Riley earlier this week for follow-up of MGUS.  We checked her skeletal survey, which does not show any lytic lesions.  No change to her plan, we will repeat labs and see her in 6 months.  Patient is aware and agreeable with plan.

## 2021-01-25 ENCOUNTER — Encounter (HOSPITAL_COMMUNITY): Payer: Self-pay | Admitting: *Deleted

## 2021-01-25 ENCOUNTER — Emergency Department (HOSPITAL_COMMUNITY)
Admission: EM | Admit: 2021-01-25 | Discharge: 2021-01-25 | Disposition: A | Discharge status: Home / Self Care | Payer: Medicare HMO | Attending: Emergency Medicine | Admitting: Emergency Medicine

## 2021-01-25 ENCOUNTER — Emergency Department (HOSPITAL_COMMUNITY): Payer: Medicare HMO

## 2021-01-25 ENCOUNTER — Other Ambulatory Visit: Payer: Self-pay

## 2021-01-25 DIAGNOSIS — M4726 Other spondylosis with radiculopathy, lumbar region: Secondary | ICD-10-CM | POA: Diagnosis not present

## 2021-01-25 DIAGNOSIS — M4720 Other spondylosis with radiculopathy, site unspecified: Secondary | ICD-10-CM

## 2021-01-25 DIAGNOSIS — Z79899 Other long term (current) drug therapy: Secondary | ICD-10-CM | POA: Diagnosis not present

## 2021-01-25 DIAGNOSIS — I10 Essential (primary) hypertension: Secondary | ICD-10-CM | POA: Diagnosis not present

## 2021-01-25 DIAGNOSIS — J45909 Unspecified asthma, uncomplicated: Secondary | ICD-10-CM | POA: Insufficient documentation

## 2021-01-25 DIAGNOSIS — M545 Low back pain, unspecified: Secondary | ICD-10-CM | POA: Diagnosis present

## 2021-01-25 DIAGNOSIS — Z7982 Long term (current) use of aspirin: Secondary | ICD-10-CM | POA: Diagnosis not present

## 2021-01-25 DIAGNOSIS — M4722 Other spondylosis with radiculopathy, cervical region: Secondary | ICD-10-CM | POA: Insufficient documentation

## 2021-01-25 DIAGNOSIS — E119 Type 2 diabetes mellitus without complications: Secondary | ICD-10-CM | POA: Insufficient documentation

## 2021-01-25 MED ORDER — TRAMADOL-ACETAMINOPHEN 37.5-325 MG PO TABS: 1.0000 | ORAL_TABLET | Freq: Four times a day (QID) | ORAL | 0 refills | Status: DC | PRN

## 2021-01-25 MED ORDER — PREDNISONE 20 MG PO TABS: 20.0000 mg | ORAL_TABLET | Freq: Two times a day (BID) | ORAL | 0 refills | Status: DC

## 2021-01-25 NOTE — Discharge Instructions (Addendum)
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  You likely have some nerve irritation contributing to your leg pain.  We are prescribing prednisone to help that.  We are also prescribing a mild narcotic pain reliever to use every 6 hours as needed for pain.  When you are using this prescription pain medicine do not take Tylenol.  At times when you do not need the narcotic pain reliever, take 2 Tylenol every 4 hours.  Do not drive or drink alcohol when taking the pain reliever.  Follow-up with your doctor as soon as possible for further care and treatment.

## 2021-01-25 NOTE — ED Provider Notes (Signed)
[facility] EMERGENCY DEPARTMENT Provider Note   CSN: [id number] Arrival date & time: 01/25/21  4854     History Chief Complaint  Patient presents with   Leg Pain    Stacey Riley is a 80 y.o. female.  HPI Patient complains of pain and neck, upper and lower back, with pain radiating to both legs.  Onset of this pain several weeks ago without trauma.  She has been using Tylenol without relief.  She saw her PCP who recommended that she start some physical therapy.  She has had 2 sessions of the therapy which is included some movement exercises and cycling.  She states that this did not help and might have made her pain worse.  She is interested in having imaging done to make sure there is nothing seriously wrong.  She denies fever, chills, weakness or dizziness.  There are no other known active modifying factors.    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) 02/14/2016   Essential hypertension 02/14/2016   Hyperlipidemia 02/14/2016   Acute bronchitis 02/14/2016   GERD (gastroesophageal reflux disease) 07/14/2011   Screen for colon cancer 07/14/2011   Dysphagia 07/14/2011    Past Surgical History:  Procedure Laterality Date   CHOLECYSTECTOMY  1990s   COLONOSCOPY  03/18/1999   Dr Rourk-int  hemorrhoids, pancolonic diverticula   COLONOSCOPY N/A 09/26/2016   Dr. Gala Romney: Diverticulosis, 4 mm polyp removed from the descending colon which is a tubular adenoma.  No  future colonoscopies unless new symptoms arise.   COLONOSCOPY WITH ESOPHAGOGASTRODUODENOSCOPY (EGD)  08/06/2011   Rourk: Schatzki ring, moderate sized hiatal hernia status post dilation for history of dysphagia. Pancolonic diverticula, single diminutive polyp at the base of the cecum removed (tubular adenoma). Next colonoscopy recommended for April 2018   ESOPHAGOGASTRODUODENOSCOPY  07/02/01   Dr Rourk-Schatzki's ring s/p 4F maloney dilation, otherwise normal/small hiatal hernia/   Linear erosion and ulceration in the proximal stomach/  The ulcerated lesion in the proximal stomach  benign.This may be a [name] lesion secondary to trauma to the mucosa, straddling the  diaphragmatic hiatus in the presence of a hiatal hernia    ESOPHAGOGASTRODUODENOSCOPY N/A 02/14/2016   Dr. Gala Romney: Large hiatal hernia, [name] lesion likely explains bleeding.   MALONEY DILATION  08/06/2011   Procedure: Venia Minks DILATION;  Surgeon: Daneil Dolin, MD;  Location: [facility] ENDO SUITE;  Service: Endoscopy;  Laterality: N/A;   POLYPECTOMY  09/26/2016   Procedure: POLYPECTOMY;  Surgeon: Daneil Dolin, MD;  Location: [facility] ENDO SUITE;  Service: Endoscopy;;  descending colon   TUBAL LIGATION       OB History   No obstetric history on file.     Family History  Problem Relation Age of Onset   Aneurysm Daughter    Stroke Daughter    Diabetes Daughter    Hypertension Mother    Dementia Mother    Hypertension Father    Kidney  cancer Brother    Cancer Brother        brain   Cancer Brother        bone   Stroke Brother    Hypertension Daughter    Hypertension Son    Hyperlipidemia Son    Colon cancer Neg Hx    Inflammatory bowel disease Neg Hx     Social History   Tobacco Use   Smoking status: Never   Smokeless tobacco: Never  Vaping Use   Vaping Use: Never used  Substance Use Topics   Alcohol use: No   Drug use: No    Home Medications Prior to Admission medications   Medication Sig Start Date End Date  Taking? Authorizing Provider  acetaminophen (TYLENOL) 500 MG tablet Take 500 mg by mouth daily as needed for headache.   Yes [provider]  albuterol (VENTOLIN HFA) 108 (90 Base) MCG/ACT inhaler Inhale 2 puffs into the lungs every 6 (six) hours as needed for wheezing or shortness of breath.   Yes [provider]  amLODipine (NORVASC) 10 MG tablet Take 1 tablet (10 mg total) by mouth daily. 03/15/20 01/25/21 Yes Strader, Fransisco Hertz, PA-C  Ascorbic Acid (VITAMIN C) 100 MG tablet Take 100 mg by mouth daily.   Yes [provider]  aspirin 81 MG tablet Take 81 mg by mouth once a week.    Yes [provider]  Cyanocobalamin (B-12 PO) Take 1 tablet by mouth daily.   Yes [provider]  Famotidine-Ca Carb-Mag Hydrox (PEPCID COMPLETE PO) Take 1 tablet by mouth as needed (indigestion).   Yes [provider]  hydrochlorothiazide (HYDRODIURIL) 25 MG tablet Take 25 mg by mouth daily. 04/22/11  Yes [provider]  lisinopril (PRINIVIL,ZESTRIL) 40 MG tablet Take 40 mg by mouth daily. 04/22/11  Yes [provider]  loratadine (CLARITIN) 10 MG tablet Take 1 tablet (10 mg total) by mouth daily. 02/16/16  Yes Thurnell Lose, MD  pantoprazole (PROTONIX) 40 MG tablet Take 1 tablet (40 mg total) by mouth daily. 06/21/18  Yes Herminio Commons, MD  pravastatin (PRAVACHOL) 20 MG tablet Take 20 mg by mouth at bedtime.   Yes [provider]  predniSONE (DELTASONE) 20 MG tablet Take 1 tablet (20 mg total) by mouth 2 (two) times daily. 01/25/21  Yes Daleen Bo, MD  traMADol-acetaminophen (ULTRACET) 37.5-325 MG tablet Take 1 tablet by mouth every 6 (six) hours as needed. 01/25/21  Yes Daleen Bo, MD  VITAMIN D PO Take 1 tablet by mouth daily.   Yes [provider]  Blood Glucose Calibration (TRUE METRIX LEVEL 1) Low SOLN  10/29/18   [provider]  Blood Glucose Monitoring Suppl (TRUE METRIX AIR GLUCOSE METER) w/Device  KIT  10/27/18   [provider]  nitroGLYCERIN (NITROSTAT) 0.4 MG SL tablet Place 1 tablet (0.4 mg total) under the tongue every 5 (five) minutes as needed for chest pain. 11/19/17   Ahmed Prima, Fransisco Hertz, PA-C  TRUE METRIX BLOOD GLUCOSE TEST test strip  06/07/18   [provider]    Allergies    Augmentin [amoxicillin-pot clavulanate], Amoxicillin, Clavulanic acid, Penicillin g, and Sulfa antibiotics  Review of Systems   Review of Systems  All other systems reviewed and are negative.  Physical Exam Updated Vital Signs BP (!) 139/47   Pulse (!) 42   Temp 97.9 F (36.6 C) (Oral)   Resp 20   Ht 5' 4" (1.626 m)   Wt 76.2 kg  SpO2 100%   BMI 28.84 kg/m   Physical Exam Vitals and nursing note reviewed.  Constitutional:      General: She is not in acute distress.    Appearance: She is well-developed. She is not ill-appearing, toxic-appearing or diaphoretic.  HENT:     Head: Normocephalic and atraumatic.     Right Ear: External ear normal.     Left Ear: External ear normal.  Eyes:     Conjunctiva/sclera: Conjunctivae normal.     Pupils: Pupils are equal, round, and reactive to light.  Neck:     Trachea: Phonation normal.  Cardiovascular:     Rate and Rhythm: Normal rate and regular rhythm.     Heart sounds: Normal heart sounds.  Pulmonary:     Effort: Pulmonary effort is normal. No respiratory distress.     Breath sounds: Normal breath sounds. No stridor.  Abdominal:     General: There is no distension.  Musculoskeletal:        General: Tenderness (Right lumbar, mild) present. No swelling. Normal range of motion.     Cervical back: Normal range of motion and neck supple.     Comments: Mild tenderness, lower paracervical musculature, and trapezius, bilaterally.  No large joint deformities.  Skin:    General: Skin is warm and dry.  Neurological:     Mental Status: She is alert and oriented to person, place, and time.     Cranial Nerves: No cranial nerve  deficit.     Sensory: No sensory deficit.     Motor: No abnormal muscle tone.     Coordination: Coordination normal.  Psychiatric:        Mood and Affect: Mood normal.        Behavior: Behavior normal.        Thought Content: Thought content normal.        Judgment: Judgment normal.    ED Results / Procedures / Treatments   Labs (all labs ordered are listed, but only abnormal results are displayed) Labs Reviewed - No data to display  EKG None  Radiology DG Cervical Spine Complete  Result Date: 01/25/2021 CLINICAL DATA:  Pain radiating down back through both legs and arms EXAM: CERVICAL SPINE - COMPLETE 4+ VIEW COMPARISON:  None. FINDINGS: The cervical spine is imaged through the top half of the C7 vertebral body. Vertebral body heights are preserved, without evidence of acute injury. There is straightening of the normal cervical spine lordosis. There is no antero or retrolisthesis. There is moderate intervertebral disc space narrowing with associated degenerative endplate change at Z6-X0 and C6-C7. The neural foramina appear overall patent. The prevertebral soft tissues are unremarkable. IMPRESSION: 1. No acute findings in the cervical spine. 2. Degenerative changes most advanced at C5-C6 and C6-C7. Electronically Signed   By: Valetta Mole M.D.   On: 01/25/2021 09:00   DG Thoracic Spine 2 View  Result Date: 01/25/2021 CLINICAL DATA:  Neck pain radiating down back and through both legs and arms EXAM: THORACIC SPINE 2 VIEWS COMPARISON:  Chest radiograph 05/13/2018 FINDINGS: The upper thoracic vertebral bodies are not well assessed due to overlying structures. The imaged vertebral body heights appear preserved. There is dextroscoliosis of the thoracic spine centered at T8-T9, unchanged. There is no antero or retrolisthesis. There is multilevel degenerative endplate change throughout the thoracic spine. The imaged heart and lungs are unremarkable. There is a large hiatal hernia. There is  calcified atherosclerotic plaque of the aortic arch. Cholecystectomy clips are noted. IMPRESSION:  1. Dextroscoliosis centered at T8-T9 and multilevel degenerative endplate changes throughout the thoracic spine. No acute findings. 2. Large hiatal hernia. Electronically Signed   By: Valetta Mole M.D.   On: 01/25/2021 09:03   DG Lumbar Spine Complete  Result Date: 01/25/2021 CLINICAL DATA:  Back pain radiating down back through both legs and arms EXAM: LUMBAR SPINE - COMPLETE 4+ VIEW COMPARISON:  None. FINDINGS: There are 5 non-rib-bearing lumbar type vertebral bodies. Vertebral body heights are preserved. There is no evidence of acute injury. Alignment appears normal, within the confines of suboptimal patient positioning. There is no spondylolysis. There is mild multilevel disc space narrowing, degenerative endplate change, and facet arthropathy throughout the lumbar spine. The SI joints are intact. IMPRESSION: 1. No acute findings. 2. Mild multilevel degenerative changes Electronically Signed   By: Valetta Mole M.D.   On: 01/25/2021 09:05    Procedures Procedures   Medications Ordered in ED Medications - No data to display  ED Course  I have reviewed the triage vital signs and the nursing notes.  Pertinent labs & imaging results that were available during my care of the patient were reviewed by me and considered in my medical decision making (see chart for details).    MDM Rules/Calculators/A&P                            Patient Vitals for the past 24 hrs:  BP Temp Temp src Pulse Resp SpO2 Height Weight  01/25/21 0900 (!) 139/47 -- -- (!) 42 20 100 % -- --  01/25/21 0830 (!) 139/47 -- -- (!) 45 19 99 % -- --  01/25/21 0800 (!) 133/53 -- -- (!) 41 18 98 % -- --  01/25/21 0730 (!) 158/51 -- -- (!) 46 16 98 % -- --  01/25/21 0727 (!) 174/57 97.9 F (36.6 C) Oral (!) 44 18 99 % -- --  01/25/21 0724 -- -- -- -- -- -- 5' 4" (1.626 m) 76.2 kg    10:02 AM Reevaluation with update and  discussion. After initial assessment and treatment, an updated evaluation reveals no change in clinical status, findings discussed with the patient and her friend who is with her, all questions answered. Daleen Bo   Medical Decision Making:  This patient is presenting for evaluation of generalized pain, which does require a range of treatment options, and is a complaint that involves a moderate risk of morbidity and mortality. The differential diagnoses include fracture, arthritis, spinal disorder. I decided to review old records, and in summary elderly female with pain for 2 weeks, without concerning red flags.  I suspect that she has undiagnosed arthritis, imaging will be ordered.  I did not require additional historical information from anyone.   Radiologic Tests Ordered, included cervical, thoracic, and lumbar spine.  I independently Visualized: Radiographic images, which show no acute abnormality   Critical Interventions-clinical evaluation, radiography, observation and reassessment  After These Interventions, the Patient was reevaluated and was found stable for discharge.  Patient with primarily neck and back pain, likely due to arthritis.  She also has some nonspecific leg pain which could indicate radiculopathy.  Doubt spinal myelopathy, serious bacterial infection or hemodynamic instability.  There is no indication for further ED treatment or hospitalization At this time.  CRITICAL CARE-no Performed by: Daleen Bo  Nursing Notes Reviewed/ Care Coordinated Applicable Imaging Reviewed Interpretation of Laboratory Data incorporated into ED treatment  The patient appears reasonably screened and/or  stabilized for discharge and I doubt any other medical condition or other [facility] requiring further screening, evaluation, or treatment in the ED at this time prior to discharge.  Plan: Home Medications-continue current; Home Treatments-gradual advance activity; return here if the  recommended treatment, does not improve the symptoms; Recommended follow up-PCP for further care and treatment     Final Clinical Impression(s) / ED Diagnoses Final diagnoses:  Osteoarthritis of spine with radiculopathy, unspecified spinal region    Rx / DC Orders ED Discharge Orders          Ordered    traMADol-acetaminophen (ULTRACET) 37.5-325 MG tablet  Every 6 hours PRN,   Status:  Discontinued        01/25/21 0957    predniSONE (DELTASONE) 20 MG tablet  2 times daily        01/25/21 0957    traMADol-acetaminophen (ULTRACET) 37.5-325 MG tablet  Every 6 hours PRN        01/25/21 0959             Daleen Bo, MD 01/25/21 1002

## 2021-01-25 NOTE — ED Triage Notes (Signed)
Pt c/o neck pain that radiates down her back and down through her leg. Pt reports it has worsened over the last 2-3 weeks. She is currently seeing PT to help with the pain.

## 2021-02-19 ENCOUNTER — Other Ambulatory Visit (HOSPITAL_COMMUNITY): Payer: Self-pay | Admitting: Internal Medicine

## 2021-02-19 DIAGNOSIS — Z13820 Encounter for screening for osteoporosis: Secondary | ICD-10-CM

## 2021-02-27 ENCOUNTER — Ambulatory Visit (HOSPITAL_COMMUNITY)
Admission: RE | Admit: 2021-02-27 | Discharge: 2021-02-27 | Disposition: A | Discharge status: Home / Self Care | Payer: Medicare HMO | Source: Ambulatory Visit | Attending: Internal Medicine | Admitting: Internal Medicine

## 2021-02-27 ENCOUNTER — Other Ambulatory Visit: Payer: Self-pay

## 2021-02-27 DIAGNOSIS — Z780 Asymptomatic menopausal state: Secondary | ICD-10-CM | POA: Diagnosis not present

## 2021-02-27 DIAGNOSIS — Z13820 Encounter for screening for osteoporosis: Secondary | ICD-10-CM | POA: Insufficient documentation

## 2021-04-18 ENCOUNTER — Other Ambulatory Visit: Payer: Self-pay

## 2021-04-18 ENCOUNTER — Inpatient Hospital Stay (HOSPITAL_COMMUNITY): Payer: Medicare HMO | Attending: Hematology

## 2021-04-18 DIAGNOSIS — R202 Paresthesia of skin: Secondary | ICD-10-CM | POA: Insufficient documentation

## 2021-04-18 DIAGNOSIS — D509 Iron deficiency anemia, unspecified: Secondary | ICD-10-CM | POA: Insufficient documentation

## 2021-04-18 DIAGNOSIS — R200 Anesthesia of skin: Secondary | ICD-10-CM | POA: Insufficient documentation

## 2021-04-18 DIAGNOSIS — Z808 Family history of malignant neoplasm of other organs or systems: Secondary | ICD-10-CM | POA: Diagnosis not present

## 2021-04-18 DIAGNOSIS — D472 Monoclonal gammopathy: Secondary | ICD-10-CM | POA: Insufficient documentation

## 2021-04-18 DIAGNOSIS — E119 Type 2 diabetes mellitus without complications: Secondary | ICD-10-CM | POA: Diagnosis not present

## 2021-04-18 DIAGNOSIS — I10 Essential (primary) hypertension: Secondary | ICD-10-CM | POA: Diagnosis not present

## 2021-04-18 DIAGNOSIS — D500 Iron deficiency anemia secondary to blood loss (chronic): Secondary | ICD-10-CM

## 2021-04-18 DIAGNOSIS — Z8051 Family history of malignant neoplasm of kidney: Secondary | ICD-10-CM | POA: Insufficient documentation

## 2021-04-18 LAB — COMPREHENSIVE METABOLIC PANEL
ALT: 22 U/L (ref 0–44)
AST: 24 U/L (ref 15–41)
Albumin: 4 g/dL (ref 3.5–5.0)
Alkaline Phosphatase: 54 U/L (ref 38–126)
Anion gap: 7 (ref 5–15)
BUN: 15 mg/dL (ref 8–23)
CO2: 30 mmol/L (ref 22–32)
Calcium: 9.2 mg/dL (ref 8.9–10.3)
Chloride: 100 mmol/L (ref 98–111)
Creatinine, Ser: 1.1 mg/dL — ABNORMAL HIGH (ref 0.44–1.00)
GFR, Estimated: 50 mL/min — ABNORMAL LOW (ref >60)
Glucose, Bld: 100 mg/dL — ABNORMAL HIGH (ref 70–99)
Potassium: 3.4 mmol/L — ABNORMAL LOW (ref 3.5–5.1)
Sodium: 137 mmol/L (ref 135–145)
Total Bilirubin: 0.3 mg/dL (ref 0.3–1.2)
Total Protein: 7.4 g/dL (ref 6.5–8.1)

## 2021-04-18 LAB — CBC WITH DIFFERENTIAL/PLATELET
Abs Immature Granulocytes: 0.01 10*3/uL (ref 0.00–0.07)
Basophils Absolute: 0 10*3/uL (ref 0.0–0.1)
Basophils Relative: 0 %
Eosinophils Absolute: 0.1 10*3/uL (ref 0.0–0.5)
Eosinophils Relative: 2 %
HCT: 41.1 % (ref 36.0–46.0)
Hemoglobin: 12.9 g/dL (ref 12.0–15.0)
Immature Granulocytes: 0 %
Lymphocytes Relative: 46 %
Lymphs Abs: 2.4 10*3/uL (ref 0.7–4.0)
MCH: 27.6 pg (ref 26.0–34.0)
MCHC: 31.4 g/dL (ref 30.0–36.0)
MCV: 87.8 fL (ref 80.0–100.0)
Monocytes Absolute: 0.5 10*3/uL (ref 0.1–1.0)
Monocytes Relative: 9 %
Neutro Abs: 2.4 10*3/uL (ref 1.7–7.7)
Neutrophils Relative %: 43 %
Platelets: 231 10*3/uL (ref 150–400)
RBC: 4.68 MIL/uL (ref 3.87–5.11)
RDW: 14.8 % (ref 11.5–15.5)
WBC: 5.4 10*3/uL (ref 4.0–10.5)
nRBC: 0 % (ref 0.0–0.2)

## 2021-04-18 LAB — IRON AND TIBC
Iron: 72 ug/dL (ref 28–170)
Saturation Ratios: 17 % (ref 10.4–31.8)
TIBC: 436 ug/dL (ref 250–450)
UIBC: 364 ug/dL

## 2021-04-18 LAB — LACTATE DEHYDROGENASE: LDH: 133 U/L (ref 98–192)

## 2021-04-18 LAB — FERRITIN: Ferritin: 10 ng/mL — ABNORMAL LOW (ref 11–307)

## 2021-04-19 LAB — KAPPA/LAMBDA LIGHT CHAINS
Kappa free light chain: 25.3 mg/L — ABNORMAL HIGH (ref 3.3–19.4)
Kappa, lambda light chain ratio: 1.3 (ref 0.26–1.65)
Lambda free light chains: 19.4 mg/L (ref 5.7–26.3)

## 2021-04-22 LAB — PROTEIN ELECTROPHORESIS, SERUM
A/G Ratio: 1 (ref 0.7–1.7)
Albumin ELP: 3.6 g/dL (ref 2.9–4.4)
Alpha-1-Globulin: 0.2 g/dL (ref 0.0–0.4)
Alpha-2-Globulin: 0.8 g/dL (ref 0.4–1.0)
Beta Globulin: 1 g/dL (ref 0.7–1.3)
Gamma Globulin: 1.5 g/dL (ref 0.4–1.8)
Globulin, Total: 3.5 g/dL (ref 2.2–3.9)
M-Spike, %: 0.8 g/dL — ABNORMAL HIGH
Total Protein ELP: 7.1 g/dL (ref 6.0–8.5)

## 2021-04-24 NOTE — Progress Notes (Signed)
[facility] [address], [place] [address]   CLINIC:  Medical Oncology/Hematology  PCP:  [facility] [address] [place] [address] [phone]   REASON FOR VISIT:  Follow-up for MGUS and iron deficiency anemia   PRIOR THERAPY: Intermittent parenteral iron therapy   CURRENT THERAPY: Observation   INTERVAL HISTORY:  Stacey Riley 81 y.o. female returns for routine follow-up of her MGUS and iron deficiency anemia.  She was last seen in office by Rojelio Brenner PA-C on 10/01/2020.  At today's visit, she reports feeling well.  No recent hospitalizations, surgeries, or changes in baseline health status.  She denies any recent bleeding such as epistaxis, hematemesis, hematochezia, or melena.  She does not have any complaints of fatigue, pica, restless legs, or headaches.  No lightheadedness or syncope.  She denies any chest pain or dyspnea on exertion.  We discussed her low iron levels, and she reports that she was previously on oral iron supplementation, which she tolerated well, but that she has not taken it for some time.  She denies any new bone pain or recent fractures. She denies any B symptoms such as fever, chills, night sweats, unintentional weight loss..   No new neurologic symptoms such as tinnitus, new-onset hearing loss, blurred vision, headache, or dizziness.   She has some chronic and intermittent numbness / tingling in hands or feet. No thromboembolic events since her last visit.  No new masses or lymphadenopathy per her report.  She has 100% energy and 100% appetite. She endorses that she is maintaining a stable weight.    REVIEW OF SYSTEMS:  Review of Systems  Constitutional:  Negative for appetite change, chills, diaphoresis, fatigue, fever and unexpected weight change.  HENT:   Negative for lump/mass and nosebleeds.   Eyes:  Negative for eye problems.  Respiratory:  Negative for cough, hemoptysis and  shortness of breath.   Cardiovascular:  Negative for chest pain, leg swelling and palpitations.  Gastrointestinal:  Negative for abdominal pain, blood in stool, constipation, diarrhea, nausea and vomiting.  Genitourinary:  Negative for hematuria.   Musculoskeletal:  Positive for arthralgias and back pain.  Skin: Negative.   Neurological:  Negative for dizziness, headaches and light-headedness.  Hematological:  Does not bruise/bleed easily.     PAST MEDICAL/SURGICAL HISTORY:  Past Medical History:  Diagnosis Date   Anemia 03/14/2016   Arthritis    Asthma    Bradycardia    DM (diabetes mellitus) (HCC)    GERD (gastroesophageal reflux disease)    High cholesterol    HTN (hypertension)    Past Surgical History:  Procedure Laterality Date   CHOLECYSTECTOMY  1990s   COLONOSCOPY  03/18/1999   Dr Rourk-int  hemorrhoids, pancolonic diverticula   COLONOSCOPY N/A 09/26/2016   Dr. Jena Gauss: Diverticulosis, 4 mm polyp removed from the descending colon which is a tubular adenoma.  No future colonoscopies unless new symptoms arise.   COLONOSCOPY WITH ESOPHAGOGASTRODUODENOSCOPY (EGD)  08/06/2011   Rourk: Schatzki ring, moderate sized hiatal hernia status post dilation for history of dysphagia. Pancolonic diverticula, single diminutive polyp at the base of the cecum removed (tubular adenoma). Next colonoscopy recommended for April 2018   ESOPHAGOGASTRODUODENOSCOPY  07/02/01   Dr Rourk-Schatzki's ring s/p 21F maloney dilation, otherwise normal/small hiatal hernia/   Linear erosion and ulceration in the proximal stomach/  The ulcerated lesion in the proximal stomach  benign.This may be a [name] lesion secondary to trauma to the mucosa, straddling the  diaphragmatic hiatus in the presence of a hiatal hernia    ESOPHAGOGASTRODUODENOSCOPY N/A 02/14/2016   Dr. Gala Romney: Large hiatal hernia, [name] lesion likely explains bleeding.   MALONEY DILATION  08/06/2011   Procedure: Venia Minks DILATION;  Surgeon: Daneil Dolin, MD;  [facility]: [facility];  Service: Endoscopy;  Laterality: N/A;   POLYPECTOMY  09/26/2016   Procedure: POLYPECTOMY;  Surgeon: Daneil Dolin, MD;  Location: [facility];  Service: Endoscopy;;  descending colon   TUBAL LIGATION       SOCIAL HISTORY:  Social History   Socioeconomic History   Marital status: Divorced    Spouse name: Not on file   Number of children: 5   Years of education: Not on file   Highest education level: Not on file  Occupational History   Occupation: retired; Charity fundraiser  Tobacco Use   Smoking status: Never   Smokeless tobacco: Never  Vaping Use   Vaping Use: Never used  Substance and Sexual Activity   Alcohol use: No   Drug use: No   Sexual activity: Not on file  Other Topics Concern   Not on file  Social History Narrative   Lives alone   Social Determinants of Health   Financial Resource Strain: Not on file  Food Insecurity: Not on file  Transportation Needs: Not on file  Physical Activity: Not on file  Stress: Not on file  Social Connections: Not on file  Intimate Partner Violence: Not on file    FAMILY HISTORY:  Family History  Problem Relation Age of Onset   Aneurysm Daughter    Stroke Daughter    Diabetes Daughter    Hypertension Mother    Dementia Mother    Hypertension Father    Kidney cancer Brother    Cancer Brother        brain   Cancer Brother        bone   Stroke Brother    Hypertension Daughter    Hypertension Son    Hyperlipidemia Son    Colon cancer Neg Hx    Inflammatory bowel disease Neg Hx     CURRENT MEDICATIONS:  Outpatient Encounter Medications as of 04/25/2021  Medication Sig   acetaminophen (TYLENOL) 500 MG tablet Take 500 mg by mouth daily as needed for headache.   albuterol (VENTOLIN HFA) 108 (90 Base) MCG/ACT inhaler Inhale 2 puffs into the lungs every 6 (six) hours as needed for wheezing or shortness of breath.   amLODipine (NORVASC) 10 MG tablet Take 1 tablet (10 mg total) by mouth  daily.   Ascorbic Acid (VITAMIN C) 100 MG tablet Take 100 mg by mouth daily.   aspirin 81 MG tablet Take 81 mg by mouth once a week.    Blood Glucose Calibration (TRUE METRIX LEVEL 1) Low SOLN    Blood Glucose Monitoring Suppl (TRUE METRIX AIR GLUCOSE METER) w/Device KIT    Cyanocobalamin (B-12 PO) Take 1 tablet by mouth daily.   Famotidine-Ca Carb-Mag Hydrox (PEPCID COMPLETE PO) Take 1 tablet by mouth as needed (indigestion).   hydrochlorothiazide (HYDRODIURIL) 25 MG tablet Take 25 mg by mouth daily.   lisinopril (PRINIVIL,ZESTRIL) 40 MG tablet Take 40 mg by mouth daily.   loratadine (CLARITIN) 10 MG tablet Take 1 tablet (10 mg total) by mouth daily.   nitroGLYCERIN (NITROSTAT) 0.4 MG SL tablet Place 1 tablet (0.4 mg total) under the tongue every 5 (five) minutes as needed for chest pain.   pantoprazole (PROTONIX) 40 MG tablet Take  1 tablet (40 mg total) by mouth daily.   pravastatin (PRAVACHOL) 20 MG tablet Take 20 mg by mouth at bedtime.   predniSONE (DELTASONE) 20 MG tablet Take 1 tablet (20 mg total) by mouth 2 (two) times daily.   traMADol-acetaminophen (ULTRACET) 37.5-325 MG tablet Take 1 tablet by mouth every 6 (six) hours as needed.   TRUE METRIX BLOOD GLUCOSE TEST test strip    VITAMIN D PO Take 1 tablet by mouth daily.   No facility-administered encounter medications on file as of 04/25/2021.    ALLERGIES:  Allergies  Allergen Reactions   Augmentin [Amoxicillin-Pot Clavulanate] Nausea And Vomiting    Hematemesis Has patient had a PCN reaction causing immediate rash, facial/tongue/throat swelling, SOB or lightheadedness with hypotension: Unknown Has patient had a PCN reaction causing severe rash involving mucus membranes or skin necrosis: Unknown Has patient had a PCN reaction that required hospitalization: Yes Has patient had a PCN reaction occurring within the last 10 years: Yes If all of the above answers are "NO", then may proceed with Cephalosporin use.    Amoxicillin  Other (See Comments)    Other reaction(s): GI Bleeding   Clavulanic Acid Nausea And Vomiting   Penicillin G Nausea And Vomiting    .Did it involve swelling of the face/tongue/throat, SOB, or low BP? No Did it involve sudden or severe rash/hives, skin peeling, or any reaction on the inside of your mouth or nose? Unknown Did you need to seek medical attention at a hospital or doctor's office? yes When did it last happen? Unknown      If all above answers are NO, may proceed with cephalosporin use.    Sulfa Antibiotics Swelling     PHYSICAL EXAM:  ECOG PERFORMANCE STATUS: 1 - Symptomatic but completely ambulatory  There were no vitals filed for this visit. There were no vitals filed for this visit. Physical Exam Constitutional:      Appearance: Normal appearance. She is obese.  HENT:     Head: Normocephalic and atraumatic.     Mouth/Throat:     Mouth: Mucous membranes are moist.  Eyes:     Extraocular Movements: Extraocular movements intact.     Pupils: Pupils are equal, round, and reactive to light.  Cardiovascular:     Rate and Rhythm: Normal rate and regular rhythm.     Pulses: Normal pulses.     Heart sounds: Normal heart sounds.  Pulmonary:     Effort: Pulmonary effort is normal.     Breath sounds: Normal breath sounds.     Comments: Diminished bases. Abdominal:     General: Bowel sounds are normal.     Palpations: Abdomen is soft.     Tenderness: There is no abdominal tenderness.  Musculoskeletal:        General: No swelling.     Right lower leg: No edema.     Left lower leg: No edema.  Lymphadenopathy:     Cervical: No cervical adenopathy.  Skin:    General: Skin is warm and dry.  Neurological:     General: No focal deficit present.     Mental Status: She is alert and oriented to person, place, and time.  Psychiatric:        Mood and Affect: Mood normal.        Behavior: Behavior normal.     LABORATORY DATA:  I have reviewed the labs as listed.  CBC     Component Value Date/Time   WBC 5.4 04/18/2021 1127  RBC 4.68 04/18/2021 1127   HGB 12.9 04/18/2021 1127   HCT 41.1 04/18/2021 1127   PLT 231 04/18/2021 1127   MCV 87.8 04/18/2021 1127   MCH 27.6 04/18/2021 1127   MCHC 31.4 04/18/2021 1127   RDW 14.8 04/18/2021 1127   LYMPHSABS 2.4 04/18/2021 1127   MONOABS 0.5 04/18/2021 1127   EOSABS 0.1 04/18/2021 1127   BASOSABS 0.0 04/18/2021 1127   CMP Latest Ref Rng & Units 04/18/2021 09/24/2020 03/12/2020  Glucose 70 - 99 mg/dL 100(H) 109(H) 99  BUN 8 - 23 mg/dL 15 26(H) 18  Creatinine 0.44 - 1.00 mg/dL 1.10(H) 1.16(H) 0.97  Sodium 135 - 145 mmol/L 137 138 138  Potassium 3.5 - 5.1 mmol/L 3.4(L) 4.0 4.1  Chloride 98 - 111 mmol/L 100 101 102  CO2 22 - 32 mmol/L $RemoveB'30 29 28  'yoNEvFwo$ Calcium 8.9 - 10.3 mg/dL 9.2 9.6 9.5  Total Protein 6.5 - 8.1 g/dL 7.4 7.7 7.8  Total Bilirubin 0.3 - 1.2 mg/dL 0.3 0.5 0.5  Alkaline Phos 38 - 126 U/L 54 51 48  AST 15 - 41 U/L 24 26 33  ALT 0 - 44 U/L 22 28 35    DIAGNOSTIC IMAGING:  I have independently reviewed the relevant imaging and discussed with the patient.  ASSESSMENT & PLAN: 1.  IgG lambda MGUS: - Diagnosed in 2018.  Levels have been relatively stable since that time, with a 1% chance per year of progression to multiple myeloma. - Immunofixation confirms IgG monoclonal protein with lambda light chain specificity - Most recent skeletal survey (10/02/2020) negative for lytic or sclerotic lesions - Most recent myeloma panel (04/18/2021): M spike stable at 0.8; free light chains with mildly elevated kappa 25.3, normal lambda 19.4, normal ratio 1.30 - Most recent labs (04/18/2021) negative for CRAB features: Hgb 12.9, creatinine 1.10, calcium 9.2 - No new bone pain or B symptoms. - PLAN: RTC in 6 months for repeat myeloma/MGUS panel + skeletal survey.  2.  Iron deficiency anemia: - Thought to be secondary to chronic GI blood loss - EGD (November 2017): Cameron lesions in the stomach, considered to be possible  source of bleeding - Colonoscopy (09/26/2016): 4 mm polyp with pathology showing tubular adenoma - She is required intermittent IV iron, most recently with IV Feraheme in February 2018 - No epistaxis, hematemesis, hematochezia, or melena. - Most recent labs (04/18/2021): Hgb 12.9, ferritin 10, iron saturation 17% - PLAN: We will restart her on oral iron supplementation (ferrous sulfate daily). - If no improvement in iron levels after 6 months (or if unable to tolerate due to side effects) would recommend IV iron at that time. - Repeat CBC/iron panel and RTC in 6 months.   PLAN SUMMARY & DISPOSITION: Labs in 6 months Skeletal survey in 6 months Office visit in 6 months, after labs/x-ray  All questions were answered. The patient knows to call the clinic with any problems, questions or concerns.  Medical decision making: Moderate  Time spent on visit: I spent 20 minutes counseling the patient face to face. The total time spent in the appointment was 30 minutes and more than 50% was on counseling.   Harriett Rush, PA-C  04/25/2021 11:03 AM

## 2021-04-25 ENCOUNTER — Inpatient Hospital Stay (HOSPITAL_BASED_OUTPATIENT_CLINIC_OR_DEPARTMENT_OTHER): Payer: Medicare HMO | Admitting: Physician Assistant

## 2021-04-25 ENCOUNTER — Other Ambulatory Visit: Payer: Self-pay

## 2021-04-25 VITALS — BP 155/55 | HR 67 | Temp 97.8°F | Resp 18 | Ht 64.0 in | Wt 161.8 lb

## 2021-04-25 DIAGNOSIS — D500 Iron deficiency anemia secondary to blood loss (chronic): Secondary | ICD-10-CM

## 2021-04-25 DIAGNOSIS — D472 Monoclonal gammopathy: Secondary | ICD-10-CM

## 2021-04-25 MED ORDER — FERROUS SULFATE 325 (65 FE) MG PO TBEC
325.0000 mg | DELAYED_RELEASE_TABLET | Freq: Every day | ORAL | 3 refills | Status: DC
Start: 2021-04-25 — End: 2023-11-17

## 2021-04-25 NOTE — Patient Instructions (Addendum)
[place] at [facility] Discharge Instructions  You were seen today by Tarri Abernethy PA-C for the following conditions.  IRON DEFICIENCY ANEMIA: Your iron levels are low.  I would like you to start taking iron pill (ferrous sulfate 325 mg) daily.  Take this in the morning with a small glass of orange juice.  This may cause some constipation, and you can use an over-the-counter stool softener to keep your bowel movements regular.  If your iron levels do not improve from taking the iron pill, we will give you IV iron at your next visit.  MGUS: As we discussed, this is an abnormal protein in your blood that can progress to a type of cancer known as multiple myeloma in about 1% of people.  At this time, your levels are stable.  You do not show any signs of progression to myeloma cancer at this time.  We will check your labs and x-rays again in 6 months.  LABS: Return in 6 months for repeat labs  OTHER TESTS: Whole body x-ray (skeletal survey) in 6 months  TREATMENT: Daily iron pill.  FOLLOW-UP APPOINTMENT: Office visit in 6 months, after labs and x-rays   Thank you for choosing [place] at [facility] to provide your oncology and hematology care.  To afford each patient quality time with our provider, please arrive at least 15 minutes before your scheduled appointment time.   If you have a lab appointment with the [facility] please come in thru the Main Entrance and check in at the main information desk.  You need to re-schedule your appointment should you arrive 10 or more minutes late.  We strive to give you quality time with our providers, and arriving late affects you and other patients whose appointments are after yours.  Also, if you no show three or more times for appointments you may be dismissed from the clinic at the providers discretion.     Again, thank you for choosing [facility].  Our hope is that these requests  will decrease the amount of time that you wait before being seen by our physicians.       _____________________________________________________________  Should you have questions after your visit to [facility], please contact our office at [phone] and follow the prompts.  Our office hours are 8:00 a.m. and 4:30 p.m. Monday - Friday.  Please note that voicemails left after 4:00 p.m. may not be returned until the following business day.  We are closed weekends and major holidays.  You do have access to a nurse 24-7, just call the main number to the clinic [phone] and do not press any options, hold on the line and a nurse will answer the phone.    For prescription refill requests, have your pharmacy contact our office and allow 72 hours.    Due to Covid, you will need to wear a mask upon entering the hospital. If you do not have a mask, a mask will be given to you at the Main Entrance upon arrival. For doctor visits, patients may have 1 support person age 78 or older with them. For treatment visits, patients can not have anyone with them due to social distancing guidelines and our immunocompromised population.

## 2021-08-14 ENCOUNTER — Encounter: Payer: Self-pay | Admitting: Internal Medicine

## 2021-10-23 ENCOUNTER — Inpatient Hospital Stay (HOSPITAL_COMMUNITY): Payer: Medicare HMO | Attending: Hematology

## 2021-10-23 ENCOUNTER — Ambulatory Visit (HOSPITAL_COMMUNITY)
Admission: RE | Admit: 2021-10-23 | Discharge: 2021-10-23 | Disposition: A | Discharge status: Home / Self Care | Payer: Medicare HMO | Source: Ambulatory Visit | Attending: Physician Assistant | Admitting: Physician Assistant

## 2021-10-23 DIAGNOSIS — D472 Monoclonal gammopathy: Secondary | ICD-10-CM | POA: Insufficient documentation

## 2021-10-23 DIAGNOSIS — E119 Type 2 diabetes mellitus without complications: Secondary | ICD-10-CM | POA: Insufficient documentation

## 2021-10-23 DIAGNOSIS — I10 Essential (primary) hypertension: Secondary | ICD-10-CM | POA: Diagnosis not present

## 2021-10-23 DIAGNOSIS — Z808 Family history of malignant neoplasm of other organs or systems: Secondary | ICD-10-CM | POA: Insufficient documentation

## 2021-10-23 DIAGNOSIS — D509 Iron deficiency anemia, unspecified: Secondary | ICD-10-CM | POA: Diagnosis present

## 2021-10-23 DIAGNOSIS — D500 Iron deficiency anemia secondary to blood loss (chronic): Secondary | ICD-10-CM

## 2021-10-23 LAB — CBC WITH DIFFERENTIAL/PLATELET
Abs Immature Granulocytes: 0.01 10*3/uL (ref 0.00–0.07)
Basophils Absolute: 0 10*3/uL (ref 0.0–0.1)
Basophils Relative: 1 %
Eosinophils Absolute: 0.2 10*3/uL (ref 0.0–0.5)
Eosinophils Relative: 3 %
HCT: 39.1 % (ref 36.0–46.0)
Hemoglobin: 12.5 g/dL (ref 12.0–15.0)
Immature Granulocytes: 0 %
Lymphocytes Relative: 42 %
Lymphs Abs: 2.3 10*3/uL (ref 0.7–4.0)
MCH: 28 pg (ref 26.0–34.0)
MCHC: 32 g/dL (ref 30.0–36.0)
MCV: 87.5 fL (ref 80.0–100.0)
Monocytes Absolute: 0.6 10*3/uL (ref 0.1–1.0)
Monocytes Relative: 11 %
Neutro Abs: 2.4 10*3/uL (ref 1.7–7.7)
Neutrophils Relative %: 43 %
Platelets: 198 10*3/uL (ref 150–400)
RBC: 4.47 MIL/uL (ref 3.87–5.11)
RDW: 13.4 % (ref 11.5–15.5)
WBC: 5.5 10*3/uL (ref 4.0–10.5)
nRBC: 0 % (ref 0.0–0.2)

## 2021-10-23 LAB — COMPREHENSIVE METABOLIC PANEL
ALT: 20 U/L (ref 0–44)
AST: 21 U/L (ref 15–41)
Albumin: 4 g/dL (ref 3.5–5.0)
Alkaline Phosphatase: 58 U/L (ref 38–126)
Anion gap: 9 (ref 5–15)
BUN: 22 mg/dL (ref 8–23)
CO2: 27 mmol/L (ref 22–32)
Calcium: 9.3 mg/dL (ref 8.9–10.3)
Chloride: 104 mmol/L (ref 98–111)
Creatinine, Ser: 1.13 mg/dL — ABNORMAL HIGH (ref 0.44–1.00)
GFR, Estimated: 49 mL/min — ABNORMAL LOW (ref >60)
Glucose, Bld: 109 mg/dL — ABNORMAL HIGH (ref 70–99)
Potassium: 3.7 mmol/L (ref 3.5–5.1)
Sodium: 140 mmol/L (ref 135–145)
Total Bilirubin: 0.4 mg/dL (ref 0.3–1.2)
Total Protein: 7.7 g/dL (ref 6.5–8.1)

## 2021-10-23 LAB — LACTATE DEHYDROGENASE: LDH: 156 U/L (ref 98–192)

## 2021-10-23 LAB — FERRITIN: Ferritin: 12 ng/mL (ref 11–307)

## 2021-10-23 LAB — IRON AND TIBC
Iron: 123 ug/dL (ref 28–170)
Saturation Ratios: 30 % (ref 10.4–31.8)
TIBC: 407 ug/dL (ref 250–450)
UIBC: 284 ug/dL

## 2021-10-24 LAB — KAPPA/LAMBDA LIGHT CHAINS
Kappa free light chain: 26.4 mg/L — ABNORMAL HIGH (ref 3.3–19.4)
Kappa, lambda light chain ratio: 1.29 (ref 0.26–1.65)
Lambda free light chains: 20.4 mg/L (ref 5.7–26.3)

## 2021-10-28 ENCOUNTER — Ambulatory Visit: Payer: Medicare HMO | Admitting: Gastroenterology

## 2021-10-28 ENCOUNTER — Encounter: Payer: Self-pay | Admitting: Gastroenterology

## 2021-10-28 VITALS — BP 171/78 | HR 67 | Temp 98.6°F | Ht 64.0 in | Wt 165.2 lb

## 2021-10-28 DIAGNOSIS — K219 Gastro-esophageal reflux disease without esophagitis: Secondary | ICD-10-CM | POA: Diagnosis not present

## 2021-10-28 DIAGNOSIS — K449 Diaphragmatic hernia without obstruction or gangrene: Secondary | ICD-10-CM

## 2021-10-28 LAB — PROTEIN ELECTROPHORESIS, SERUM
A/G Ratio: 1.1 (ref 0.7–1.7)
Albumin ELP: 3.7 g/dL (ref 2.9–4.4)
Alpha-1-Globulin: 0.2 g/dL (ref 0.0–0.4)
Alpha-2-Globulin: 0.9 g/dL (ref 0.4–1.0)
Beta Globulin: 0.9 g/dL (ref 0.7–1.3)
Gamma Globulin: 1.5 g/dL (ref 0.4–1.8)
Globulin, Total: 3.5 g/dL (ref 2.2–3.9)
M-Spike, %: 0.7 g/dL — ABNORMAL HIGH
Total Protein ELP: 7.2 g/dL (ref 6.0–8.5)

## 2021-10-28 NOTE — Patient Instructions (Signed)
On she had been taking your pantoprazole 40 mg every other day.  Continue this for about 2 months and then you may stop.  Continue to monitor for breakthrough reflux symptoms.  As you cut back you may continue to take the Pepcid and you can take it up to twice a day as needed.  Continue to follow GERD diet:  -Avoid processed, fatty/greasy foods, and spicy foods  -Eat lean meats such as chicken, [place], fish  -Avoid high acidic foods such as tomatoes, and citrus juices.  -Avoid eating within 2 to 3 hours of lying down  -Sleep with head propped up at night if symptoms occur typically in the evenings  -Continue to avoid NSAID products such as ibuprofen, Aleve, Advil, aspirin, Goody powders  We will have you follow-up in 6 months or sooner if needed!  Is a pleasure to meet you today!  I hope you have a great rest of your summer, Thanksgiving, and Christmas if I do not see you before then.  I want to create trusting relationships with patients. If you receive a survey regarding your visit,  I greatly appreciate you taking time to fill this out on paper or through your MyChart. I value your feedback.  Venetia Night, MSN, FNP-BC, AGACNP-BC [facility]

## 2021-10-28 NOTE — Progress Notes (Signed)
GI Office Note    Referring Provider: [facility] Primary Care Physician:  [facility]: Dr. Gala Romney  Date:  10/28/2021  ID:  Stacey Riley, DOB 18-Nov-1940, MRN [id number]   Chief Complaint   Chief Complaint  Patient presents with   Follow-up    Sometimes the hiatal hernia on her right side is sore.     History of Present Illness  Stacey Riley is a 81 y.o. female with a history of anemia, asthma, diabetes, GERD, arterial hernia, and HTN presenting today for follow up.   Last EGD in November 2017.  Normal esophagus, large hiatal hernia, Stacey Riley lesion lesions noted along the diaphragmatic hiatus with an area of adherent clot without active bleeding but likely source of prior bleeding.  She was treated with Carafate and PPI.  Last colonoscopy June 2018.  Diverticulosis in the entire examined colon, single 4 mm polyp in the descending colon.  Repeat not recommended due to age.  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  Most recent hemoglobin on 10/23/2021 was 12.5, normal iron, ferritin 12.  Prior history of anemia felt to be due to chronic GI blood loss from her stomach.  Today: GERD: Does good, sometimes does have some tenderness from time to time due to her hernia especially if she does a lot of bending over. Continues to take pepcid complete as needed. Usually takes it if she's going to eat something that she knows will bother her. Has been taking pantoprazole 40 mg daily and using the pepcid. She states the last time she had x-rays she was told her bones were strong. She has been taking Vitamin D and calcium as advised. She would like to try to wean off protonix. Denies dysphagia, N/V, lack of appetite, early satiety.  People on the road do make her nervous and thinks that might be why her blood pressure has been elevated driving to her appointments. BP at home has been good. At home her highest blood pressure  has been systolic 518 and her bottom number has been in the 50's.    Past Medical History:  Diagnosis Date   Anemia 03/14/2016   Arthritis    Asthma    Bradycardia    DM (diabetes mellitus) (HCC)    GERD (gastroesophageal reflux disease)    High cholesterol    HTN (hypertension)     Past Surgical History:  Procedure Laterality Date   CHOLECYSTECTOMY  1990s   COLONOSCOPY  03/18/1999   Dr Rourk-int  hemorrhoids, pancolonic diverticula   COLONOSCOPY N/A 09/26/2016   Dr. Gala Romney: Diverticulosis, 4 mm polyp removed from the descending colon which is a tubular adenoma.  No future colonoscopies unless new symptoms arise.   COLONOSCOPY WITH ESOPHAGOGASTRODUODENOSCOPY (EGD)  08/06/2011   Rourk: Schatzki ring, moderate sized hiatal hernia status post dilation for history of dysphagia. Pancolonic diverticula, single diminutive polyp at the base of the cecum removed (tubular adenoma). Next colonoscopy recommended for April 2018   ESOPHAGOGASTRODUODENOSCOPY  07/02/01   Dr Rourk-Schatzki's ring s/p 82F maloney dilation, otherwise normal/small hiatal hernia/   Linear erosion and ulceration in the proximal stomach/  The ulcerated lesion in the proximal stomach  benign.This may be a Stacey Riley lesion secondary to trauma to the mucosa, straddling the  diaphragmatic hiatus in the presence of a hiatal hernia    ESOPHAGOGASTRODUODENOSCOPY N/A 02/14/2016   Dr. Gala Romney: Large hiatal hernia, Stacey Riley lesion likely explains bleeding.   MALONEY  DILATION  08/06/2011   Procedure: Venia Minks DILATION;  Surgeon: Daneil Dolin, MD;  Location: [facility];  Service: Endoscopy;  Laterality: N/A;   POLYPECTOMY  09/26/2016   Procedure: POLYPECTOMY;  Surgeon: Daneil Dolin, MD;  Location: [facility];  Service: Endoscopy;;  descending colon   TUBAL LIGATION      Current Outpatient Medications  Medication Sig Dispense Refill   acetaminophen (TYLENOL) 500 MG tablet Take 500 mg by mouth daily as needed for headache.     Alcohol  Swabs (DROPSAFE ALCOHOL PREP) 70 % PADS See admin instructions.     amLODipine (NORVASC) 10 MG tablet Take 1 tablet (10 mg total) by mouth daily. 30 tablet 0   Ascorbic Acid (VITAMIN C) 100 MG tablet Take 100 mg by mouth daily.     aspirin 81 MG tablet Take 81 mg by mouth once a week.      B Complex Vitamins (VITAMIN B COMPLEX) TABS See admin instructions.     Blood Glucose Calibration (TRUE METRIX LEVEL 1) Low SOLN      Blood Glucose Monitoring Suppl (TRUE METRIX AIR GLUCOSE METER) w/Device KIT      Cyanocobalamin (B-12 PO) Take 1 tablet by mouth daily.     ferrous sulfate 325 (65 FE) MG EC tablet Take 1 tablet (325 mg total) by mouth daily with breakfast. 90 tablet 3   hydrochlorothiazide (HYDRODIURIL) 25 MG tablet Take 25 mg by mouth daily.     lisinopril (PRINIVIL,ZESTRIL) 40 MG tablet Take 40 mg by mouth daily.     loratadine (CLARITIN) 10 MG tablet Take 1 tablet (10 mg total) by mouth daily. 30 tablet 0   Multiple Vitamins-Minerals (VITAMIN D3 COMPLETE) TABS See admin instructions.     pantoprazole (PROTONIX) 40 MG tablet Take 1 tablet (40 mg total) by mouth daily. 90 tablet 3   pravastatin (PRAVACHOL) 20 MG tablet Take 20 mg by mouth at bedtime.     TRUE METRIX BLOOD GLUCOSE TEST test strip      TRUEplus Lancets 33G MISC TEST BLOOD SUGAR EVERY DAY     VITAMIN D PO Take 1 tablet by mouth daily.     albuterol (VENTOLIN HFA) 108 (90 Base) MCG/ACT inhaler Inhale 2 puffs into the lungs every 6 (six) hours as needed for wheezing or shortness of breath. (Patient not taking: Reported on 04/25/2021)     Famotidine-Ca Carb-Mag Hydrox (PEPCID COMPLETE PO) Take 1 tablet by mouth as needed (indigestion). (Patient not taking: Reported on 04/25/2021)     nitroGLYCERIN (NITROSTAT) 0.4 MG SL tablet Place 1 tablet (0.4 mg total) under the tongue every 5 (five) minutes as needed for chest pain. (Patient not taking: Reported on 04/25/2021) 25 tablet 3   No current facility-administered medications for this  visit.    Allergies as of 10/28/2021 - Review Complete 10/28/2021  Allergen Reaction Noted   Augmentin [amoxicillin-pot clavulanate] Nausea And Vomiting 02/14/2016   Amoxicillin Other (See Comments) 05/05/2019   Clavulanic acid Nausea And Vomiting 07/08/2017   Penicillin g Nausea And Vomiting 07/08/2017   Sulfa antibiotics Swelling 07/14/2011    Family History  Problem Relation Age of Onset   Aneurysm Daughter    Stroke Daughter    Diabetes Daughter    Hypertension Mother    Dementia Mother    Hypertension Father    Kidney cancer Brother    Cancer Brother        brain   Cancer Brother  bone   Stroke Brother    Hypertension Daughter    Hypertension Son    Hyperlipidemia Son    Colon cancer Neg Hx    Inflammatory bowel disease Neg Hx     Social History   Socioeconomic History   Marital status: Divorced    Spouse name: Not on file   Number of children: 5   Years of education: Not on file   Highest education level: Not on file  Occupational History   Occupation: retired; Charity fundraiser  Tobacco Use   Smoking status: Never   Smokeless tobacco: Never  Vaping Use   Vaping Use: Never used  Substance and Sexual Activity   Alcohol use: No   Drug use: No   Sexual activity: Not on file  Other Topics Concern   Not on file  Social History Narrative   Lives alone   Social Determinants of Health   Financial Resource Strain: Low Risk  (03/19/2020)   Overall Financial Resource Strain (CARDIA)    Difficulty of Paying Living Expenses: Not hard at all  Food Insecurity: No Food Insecurity (03/19/2020)   Hunger Vital Sign    Worried About Running Out of Food in the Last Year: Never true    [place] in the Last Year: Never true  Transportation Needs: No Transportation Needs (03/19/2020)   PRAPARE - Hydrologist (Medical): No    Lack of Transportation (Non-Medical): No  Physical Activity: Inactive (03/19/2020)   Exercise Vital Sign    Days  of Exercise per Week: 0 days    Minutes of Exercise per Session: 0 min  Stress: No Stress Concern Present (03/19/2020)   [place]    Feeling of Stress : Not at all  Social Connections: Moderately Isolated (03/19/2020)   Social Connection and Isolation Panel [NHANES]    Frequency of Communication with Friends and Family: More than three times a week    Frequency of Social Gatherings with Friends and Family: Three times a week    Attends Religious Services: 1 to 4 times per year    Active Member of Clubs or Organizations: No    Attends Archivist Meetings: Never    Marital Status: Never married     Review of Systems   Gen: Denies fever, chills, anorexia. Denies fatigue, weakness, weight loss.  CV: Denies chest pain, palpitations, syncope, peripheral edema, and claudication. Resp: Denies dyspnea at rest, cough, wheezing, coughing up blood, and pleurisy. GI: see HPI Derm: Denies rash, itching, dry skin Psych: Denies depression, anxiety, memory loss, confusion. No homicidal or suicidal ideation.  Heme: Denies bruising, bleeding, and enlarged lymph nodes.   Physical Exam   BP (!) 171/78   Pulse 67   Temp 98.6 F (37 C) (Temporal)   Ht _0  (1.626 m)   Wt 165 lb 3.2 oz (74.9 kg)   BMI 28.36 kg/m   General:   Alert and oriented. No distress noted. Pleasant and cooperative.  Head:  Normocephalic and atraumatic. Eyes:  Conjuctiva clear without scleral icterus. Mouth:  Oral mucosa pink and moist. Good dentition. No lesions. Lungs:  Clear to auscultation bilaterally. No wheezes, rales, or rhonchi. No distress.  Heart:  S1, S2 present without murmurs appreciated.  Abdomen:  +BS, soft, non-tender and non-distended. No rebound or guarding. No HSM or masses noted. Rectal: deferred Msk:  Symmetrical without gross deformities. Normal posture. Extremities:  Without edema. Neurologic:  Alert and  oriented  x4 Psych:  Alert and cooperative. Normal mood and affect.   Assessment  Stacey Riley is a 81 y.o. female with a history of asthma, diabetes, GERD, large hiatal hernia, HTN, anemia presenting today for follow up.   GERD/large hiatal hernia: Patient reports she used to take pantoprazole 40 mg twice daily a while back before little over a year she weaned herself to daily.  He is to have occasional breakthrough symptoms but will use Pepcid Complete as needed especially she will have something that she knows is bothersome to her.  Patient concerned with long-term use of pantoprazole regarding bone health.  She has been experiencing arthritis symptoms recently.  Patient would like to attempt to wean off medication.  I agree with this plan and have recommended for her to begin taking Protonix every other day for about 2 months and then stop taking the medication and monitor symptoms.  We discussed that if she has frequent breakthrough symptoms that she may take the Pepcid daily and up to twice a day as needed.  GERD diet/monitor so modifications reinforced.  No need for EGD.  Discussed follow-up in 6 months and if doing well then yearly.  PLAN   Wean Protonix to every other day for 2 months and then stop taking and monitor symptoms.  Continue Pepcid as needed, may take twice a day.  Continue GERD diet/lifestyle modifications, reinforced today. Follow-up in 6 months   Venetia Night, MSN, FNP-BC, AGACNP-BC [facility]

## 2021-10-29 NOTE — Progress Notes (Signed)
[facility] [address], [place] [address]   CLINIC:  Medical Oncology/Hematology  PCP:  [facility] [address] [place] [address] [phone]   REASON FOR VISIT:  Follow-up for MGUS and iron deficiency anemia   PRIOR THERAPY: Intermittent parenteral iron therapy   CURRENT THERAPY: Observation, iron pill   INTERVAL HISTORY:  Ms. Skogen 81 y.o. female returns for routine follow-up of her MGUS and iron deficiency anemia.  She was last seen in office by Rojelio Brenner PA-C on 04/25/2021.  At today's visit, she reports feeling well.  No recent hospitalizations, surgeries, or changes in baseline health status. She denies any recent bleeding such as epistaxis, hematemesis, hematochezia, or melena.  She reports that she has intermittent fatigue that depends on the day, with some days where she feels "real tired;" she notes decreased stamina and states that she tires quickly after her activities.  She has occasional headaches.  She does not have any lightheadedness or syncope.  She does not have any complaints of pica, restless legs, chest pain, or dyspnea on exertion. She is taking iron tablet daily without any significant side effects.    She denies any new bone pain or recent fractures.  She reports occasional hot flashes and night sweats.  She denies any unexplained fever, chills, or weight loss.  No new neurologic symptoms such as tinnitus, new-onset hearing loss, blurred vision, headache, or dizziness.  She has some chronic and intermittent numbness / tingling in hands or feet. No thromboembolic events since her last visit.  No new masses or lymphadenopathy per her report.  She has 100% appetite.   She endorses that she is maintaining a stable weight.   REVIEW OF SYSTEMS:    Review of Systems  Constitutional:  Negative for appetite change, chills, diaphoresis, fatigue, fever and unexpected weight change.  HENT:   Negative  for lump/mass and nosebleeds.   Eyes:  Negative for eye problems.  Respiratory:  Negative for cough, hemoptysis and shortness of breath.   Cardiovascular:  Negative for chest pain, leg swelling and palpitations.  Gastrointestinal:  Negative for abdominal pain, blood in stool, constipation, diarrhea, nausea and vomiting.  Genitourinary:  Positive for dysuria and frequency. Negative for hematuria.   Musculoskeletal:  Positive for arthralgias (left knee arthritis) and back pain.  Skin: Negative.   Neurological:  Positive for headaches and numbness. Negative for dizziness and light-headedness.  Hematological:  Does not bruise/bleed easily.  Psychiatric/Behavioral:  Positive for sleep disturbance.      PAST MEDICAL/SURGICAL HISTORY:  Past Medical History:  Diagnosis Date   Anemia 03/14/2016   Arthritis    Asthma    Bradycardia    DM (diabetes mellitus) (HCC)    GERD (gastroesophageal reflux disease)    High cholesterol    HTN (hypertension)    Past Surgical History:  Procedure Laterality Date   CHOLECYSTECTOMY  1990s   COLONOSCOPY  03/18/1999   Dr Rourk-int  hemorrhoids, pancolonic diverticula   COLONOSCOPY N/A 09/26/2016   Dr. Jena Gauss: Diverticulosis, 4 mm polyp removed from the descending colon which is a tubular adenoma.  No future colonoscopies unless new symptoms arise.   COLONOSCOPY WITH ESOPHAGOGASTRODUODENOSCOPY (EGD)  08/06/2011   Rourk: Schatzki ring, moderate sized hiatal hernia status post dilation for history of dysphagia. Pancolonic diverticula, single diminutive polyp at the base of the cecum removed (tubular adenoma). Next colonoscopy recommended for April 2018   ESOPHAGOGASTRODUODENOSCOPY  07/02/01   Dr Rourk-Schatzki's ring  s/p 80F maloney dilation, otherwise normal/small hiatal hernia/   Linear erosion and ulceration in the proximal stomach/  The ulcerated lesion in the proximal stomach  benign.This may be a [name] lesion secondary to trauma to the mucosa, straddling the   diaphragmatic hiatus in the presence of a hiatal hernia    ESOPHAGOGASTRODUODENOSCOPY N/A 02/14/2016   Dr. Jena Gauss: Large hiatal hernia, [name] lesion likely explains bleeding.   MALONEY DILATION  08/06/2011   Procedure: [name] DILATION;  Surgeon: [name], MD;  [facility];  Service: Endoscopy;  Laterality: N/A;   POLYPECTOMY  09/26/2016   Procedure: POLYPECTOMY;  Surgeon: [name], MD;  Location: [facility];  Service: Endoscopy;;  descending colon   TUBAL LIGATION       SOCIAL HISTORY:  Social History   Socioeconomic History   Marital status: Divorced    Spouse name: Not on file   Number of children: 5   Years of education: Not on file   Highest education level: Not on file  Occupational History   Occupation: retired; Designer, fashion/clothing  Tobacco Use   Smoking status: Never   Smokeless tobacco: Never  Vaping Use   Vaping Use: Never used  Substance and Sexual Activity   Alcohol use: No   Drug use: No   Sexual activity: Not on file  Other Topics Concern   Not on file  Social History Narrative   Lives alone   Social Determinants of Health   Financial Resource Strain: Low Risk  (03/19/2020)   Overall Financial Resource Strain (CARDIA)    Difficulty of Paying Living Expenses: Not hard at all  Food Insecurity: No Food Insecurity (03/19/2020)   Hunger Vital Sign    Worried About Running Out of Food in the Last Year: Never true    Ran Out of Food in the Last Year: Never true  Transportation Needs: No Transportation Needs (03/19/2020)   PRAPARE - Administrator, Civil Service (Medical): No    Lack of Transportation (Non-Medical): No  Physical Activity: Inactive (03/19/2020)   Exercise Vital Sign    Days of Exercise per Week: 0 days    Minutes of Exercise per Session: 0 min  Stress: No Stress Concern Present (03/19/2020)   [employer] of Occupational Health - Occupational Stress Questionnaire    Feeling of Stress : Not at all  Social  Connections: Moderately Isolated (03/19/2020)   Social Connection and Isolation Panel [NHANES]    Frequency of Communication with Friends and Family: More than three times a week    Frequency of Social Gatherings with Friends and Family: Three times a week    Attends Religious Services: 1 to 4 times per year    Active Member of Clubs or Organizations: No    Attends Banker Meetings: Never    Marital Status: Never married  Intimate Partner Violence: Not At Risk (03/19/2020)   Humiliation, Afraid, Rape, and Kick questionnaire    Fear of Current or Ex-Partner: No    Emotionally Abused: No    Physically Abused: No    Sexually Abused: No    FAMILY HISTORY:  Family History  Problem Relation Age of Onset   Aneurysm Daughter    Stroke Daughter    Diabetes Daughter    Hypertension Mother    Dementia Mother    Hypertension Father    Kidney cancer Brother    Cancer Brother        brain   Cancer Brother  bone   Stroke Brother    Hypertension Daughter    Hypertension Son    Hyperlipidemia Son    Colon cancer Neg Hx    Inflammatory bowel disease Neg Hx     CURRENT MEDICATIONS:  Outpatient Encounter Medications as of 10/30/2021  Medication Sig   acetaminophen (TYLENOL) 500 MG tablet Take 500 mg by mouth daily as needed for headache.   albuterol (VENTOLIN HFA) 108 (90 Base) MCG/ACT inhaler Inhale 2 puffs into the lungs every 6 (six) hours as needed for wheezing or shortness of breath.   Alcohol Swabs (DROPSAFE ALCOHOL PREP) 70 % PADS See admin instructions.   Ascorbic Acid (VITAMIN C) 100 MG tablet Take 100 mg by mouth daily.   aspirin 81 MG tablet Take 81 mg by mouth once a week.    B Complex Vitamins (VITAMIN B COMPLEX) TABS See admin instructions.   Blood Glucose Calibration (TRUE METRIX LEVEL 1) Low SOLN    Blood Glucose Monitoring Suppl (TRUE METRIX AIR GLUCOSE METER) w/Device KIT    Cyanocobalamin (B-12 PO) Take 1 tablet by mouth daily.   Famotidine-Ca Carb-Mag  Hydrox (PEPCID COMPLETE PO) Take 1 tablet by mouth as needed (indigestion).   ferrous sulfate 325 (65 FE) MG EC tablet Take 1 tablet (325 mg total) by mouth daily with breakfast.   hydrochlorothiazide (HYDRODIURIL) 25 MG tablet Take 25 mg by mouth daily.   lisinopril (PRINIVIL,ZESTRIL) 40 MG tablet Take 40 mg by mouth daily.   loratadine (CLARITIN) 10 MG tablet Take 1 tablet (10 mg total) by mouth daily.   Multiple Vitamins-Minerals (VITAMIN D3 COMPLETE) TABS See admin instructions.   pantoprazole (PROTONIX) 40 MG tablet Take 1 tablet (40 mg total) by mouth daily.   pravastatin (PRAVACHOL) 20 MG tablet Take 20 mg by mouth at bedtime.   TRUE METRIX BLOOD GLUCOSE TEST test strip    TRUEplus Lancets 33G MISC TEST BLOOD SUGAR EVERY DAY   VITAMIN D PO Take 1 tablet by mouth daily.   amLODipine (NORVASC) 10 MG tablet Take 1 tablet (10 mg total) by mouth daily.   nitroGLYCERIN (NITROSTAT) 0.4 MG SL tablet Place 1 tablet (0.4 mg total) under the tongue every 5 (five) minutes as needed for chest pain. (Patient not taking: Reported on 10/30/2021)   No facility-administered encounter medications on file as of 10/30/2021.    ALLERGIES:  Allergies  Allergen Reactions   Augmentin [Amoxicillin-Pot Clavulanate] Nausea And Vomiting    Hematemesis Has patient had a PCN reaction causing immediate rash, facial/tongue/throat swelling, SOB or lightheadedness with hypotension: Unknown Has patient had a PCN reaction causing severe rash involving mucus membranes or skin necrosis: Unknown Has patient had a PCN reaction that required hospitalization: Yes Has patient had a PCN reaction occurring within the last 10 years: Yes If all of the above answers are "NO", then may proceed with Cephalosporin use.    Amoxicillin Other (See Comments)    Other reaction(s): GI Bleeding   Clavulanic Acid Nausea And Vomiting   Penicillin G Nausea And Vomiting    .Did it involve swelling of the face/tongue/throat, SOB, or low BP?  No Did it involve sudden or severe rash/hives, skin peeling, or any reaction on the inside of your mouth or nose? Unknown Did you need to seek medical attention at a hospital or doctor's office? yes When did it last happen? Unknown      If all above answers are "NO", may proceed with cephalosporin use.    Sulfa Antibiotics Swelling  PHYSICAL EXAM:    ECOG PERFORMANCE STATUS: 1 - Symptomatic but completely ambulatory  Vitals:   10/30/21 1030  BP: (!) 155/54  Pulse: (!) 50  Resp: 16  Temp: 98 F (36.7 C)  SpO2: 99%   Filed Weights   10/30/21 1030  Weight: 165 lb 2 oz (74.9 kg)   Physical Exam Constitutional:      Appearance: Normal appearance. She is obese.  HENT:     Head: Normocephalic and atraumatic.     Mouth/Throat:     Mouth: Mucous membranes are moist.  Eyes:     Extraocular Movements: Extraocular movements intact.     Pupils: Pupils are equal, round, and reactive to light.  Cardiovascular:     Rate and Rhythm: Regular rhythm. Bradycardia present.     Pulses: Normal pulses.     Heart sounds: Normal heart sounds.  Pulmonary:     Effort: Pulmonary effort is normal.     Breath sounds: Normal breath sounds.     Comments: Diminished bases. Abdominal:     General: Bowel sounds are normal.     Palpations: Abdomen is soft.     Tenderness: There is no abdominal tenderness.  Musculoskeletal:        General: No swelling.     Right lower leg: No edema.     Left lower leg: No edema.  Lymphadenopathy:     Cervical: No cervical adenopathy.  Skin:    General: Skin is warm and dry.  Neurological:     General: No focal deficit present.     Mental Status: She is alert and oriented to person, place, and time.  Psychiatric:        Mood and Affect: Mood normal.        Behavior: Behavior normal.     LABORATORY DATA:  I have reviewed the labs as listed.  CBC    Component Value Date/Time   WBC 5.5 10/23/2021 1306   RBC 4.47 10/23/2021 1306   HGB 12.5 10/23/2021  1306   HCT 39.1 10/23/2021 1306   PLT 198 10/23/2021 1306   MCV 87.5 10/23/2021 1306   MCH 28.0 10/23/2021 1306   MCHC 32.0 10/23/2021 1306   RDW 13.4 10/23/2021 1306   LYMPHSABS 2.3 10/23/2021 1306   MONOABS 0.6 10/23/2021 1306   EOSABS 0.2 10/23/2021 1306   BASOSABS 0.0 10/23/2021 1306      Latest Ref Rng & Units 10/23/2021    1:06 PM 04/18/2021   11:27 AM 09/24/2020   12:54 PM  CMP  Glucose 70 - 99 mg/dL 604  540  981   BUN 8 - 23 mg/dL 22  15  26    Creatinine 0.44 - 1.00 mg/dL 1.91  4.78  2.95   Sodium 135 - 145 mmol/L 140  137  138   Potassium 3.5 - 5.1 mmol/L 3.7  3.4  4.0   Chloride 98 - 111 mmol/L 104  100  101   CO2 22 - 32 mmol/L 27  30  29    Calcium 8.9 - 10.3 mg/dL 9.3  9.2  9.6   Total Protein 6.5 - 8.1 g/dL 7.7  7.4  7.7   Total Bilirubin 0.3 - 1.2 mg/dL 0.4  0.3  0.5   Alkaline Phos 38 - 126 U/L 58  54  51   AST 15 - 41 U/L 21  24  26    ALT 0 - 44 U/L 20  22  28      DIAGNOSTIC IMAGING:  I have independently reviewed the relevant imaging and discussed with the patient.  ASSESSMENT & PLAN: 1.  IgG lambda MGUS: - Diagnosed in 2018.  Levels have been relatively stable since that time, with a 1% chance per year of progression to multiple myeloma. - Immunofixation confirms IgG monoclonal protein with lambda light chain specificity - Most recent skeletal survey (10/25/2021) negative for lytic or sclerotic lesions - Most recent myeloma panel (10/25/2021): M spike stable at 0.7; free light chains with mildly elevated kappa 26.4, normal lambda 20.4, normal ratio 1.29 - Most recent labs (10/25/2021) negative for CRAB features: Hgb 12.5, creatinine 1.13, calcium 9.3 - No new bone pain or B symptoms.   - PLAN: RTC in 6 months for repeat myeloma/MGUS panel.  Next skeletal survey due July 2024.  2.  Iron deficiency anemia: - Thought to be secondary to chronic GI blood loss - EGD (November 2017): Cameron lesions in the stomach, considered to be possible source of bleeding -  Colonoscopy (09/26/2016): 4 mm polyp with pathology showing tubular adenoma - She is required intermittent IV iron, most recently with IV Feraheme in February 2018 - No epistaxis, hematemesis, hematochezia, or melena.   - She has been taking ferrous sulfate daily since January 2023, which she is tolerating well  - Most recent labs (10/23/2021): Hgb 12.5, ferritin 12, iron saturation 30% - PLAN: She has persistently symptomatic iron deficiency (fatigue in the setting of ferritin <100), despite taking oral iron supplementation x6 months.  Therefore, recommend IV repletion with Venofer 300 mg x 3.  Patient is agreeable. - Recommend patient that she continue iron pill daily as well. - Repeat CBC/iron panel and RTC in 6 months.   PLAN SUMMARY & DISPOSITION: IV Venofer 300 mg x 3 Labs in 6 months Office visit 1 week after labs  All questions were answered. The patient knows to call the clinic with any problems, questions or concerns.  Medical decision making: Moderate    Time spent on visit: I spent 20 minutes counseling the patient face to face. The total time spent in the appointment was 30 minutes and more than 50% was on counseling.   Carnella Guadalajara, PA-C  10/30/2021 1:37 PM

## 2021-10-30 ENCOUNTER — Inpatient Hospital Stay (HOSPITAL_COMMUNITY): Payer: Medicare HMO | Admitting: Physician Assistant

## 2021-10-30 VITALS — BP 155/54 | HR 50 | Temp 98.0°F | Resp 16 | Ht 64.0 in | Wt 165.1 lb

## 2021-10-30 DIAGNOSIS — D500 Iron deficiency anemia secondary to blood loss (chronic): Secondary | ICD-10-CM

## 2021-10-30 DIAGNOSIS — D472 Monoclonal gammopathy: Secondary | ICD-10-CM

## 2021-10-30 DIAGNOSIS — D509 Iron deficiency anemia, unspecified: Secondary | ICD-10-CM | POA: Diagnosis not present

## 2021-10-30 NOTE — Patient Instructions (Signed)
[place] at [facility] Discharge Instructions  You were seen today by Tarri Abernethy PA-C for the following conditions.  IRON DEFICIENCY: Your iron levels are still low, even though you have been taking the iron pill. - We will schedule you for IV iron x3 doses. - You should still continue taking iron pill (ferrous sulfate 325 mg) daily.  Take this in the morning with a small glass of orange juice.  This may cause some constipation, and you can use an over-the-counter stool softener to keep your bowel movements regular.  If your iron levels do not improve from taking the iron pill, we will give you IV iron at your next visit.  MGUS: As we discussed, this is an abnormal protein in your blood that can progress to a type of cancer known as multiple myeloma in about 1% of people.  At this time, your levels are stable.  You do not show any signs of progression to myeloma cancer at this time.  We will check your labs again in 6 months.  LABS: Return in 6 months for repeat labs  FOLLOW-UP APPOINTMENT: Office visit in 6 months, after labs    - - - - - - - - - - - - - - - - - -    Thank you for choosing [place] at [facility] to provide your oncology and hematology care.  To afford each patient quality time with our provider, please arrive at least 15 minutes before your scheduled appointment time.   If you have a lab appointment with the [facility] please come in thru the Main Entrance and check in at the main information desk.  You need to re-schedule your appointment should you arrive 10 or more minutes late.  We strive to give you quality time with our providers, and arriving late affects you and other patients whose appointments are after yours.  Also, if you no show three or more times for appointments you may be dismissed from the clinic at the providers discretion.     Again, thank you for choosing [facility].  Our hope is  that these requests will decrease the amount of time that you wait before being seen by our physicians.       _____________________________________________________________  Should you have questions after your visit to [facility], please contact our office at [phone] and follow the prompts.  Our office hours are 8:00 a.m. and 4:30 p.m. Monday - Friday.  Please note that voicemails left after 4:00 p.m. may not be returned until the following business day.  We are closed weekends and major holidays.  You do have access to a nurse 24-7, just call the main number to the clinic [phone] and do not press any options, hold on the line and a nurse will answer the phone.    For prescription refill requests, have your pharmacy contact our office and allow 72 hours.    Due to Covid, you will need to wear a mask upon entering the hospital. If you do not have a mask, a mask will be given to you at the Main Entrance upon arrival. For doctor visits, patients may have 1 support person age 58 or older with them. For treatment visits, patients can not have anyone with them due to social distancing guidelines and our immunocompromised population.

## 2021-11-04 ENCOUNTER — Inpatient Hospital Stay (HOSPITAL_COMMUNITY): Payer: Medicare HMO

## 2021-11-04 VITALS — BP 140/44 | HR 48 | Temp 97.2°F | Resp 17

## 2021-11-04 DIAGNOSIS — D500 Iron deficiency anemia secondary to blood loss (chronic): Secondary | ICD-10-CM

## 2021-11-04 DIAGNOSIS — D509 Iron deficiency anemia, unspecified: Secondary | ICD-10-CM | POA: Diagnosis not present

## 2021-11-04 MED ORDER — SODIUM CHLORIDE 0.9 % IV SOLN
300.0000 mg | Freq: Once | INTRAVENOUS | Status: AC
  Administered 2021-11-04: 300 mg via INTRAVENOUS
  Filled 2021-11-04: qty 15

## 2021-11-04 MED ORDER — SODIUM CHLORIDE 0.9 % IV SOLN: Freq: Once | INTRAVENOUS | Status: AC

## 2021-11-04 MED ORDER — ACETAMINOPHEN 325 MG PO TABS
650.0000 mg | ORAL_TABLET | Freq: Once | ORAL | Status: AC
  Administered 2021-11-04: 650 mg via ORAL
  Filled 2021-11-04: qty 2

## 2021-11-04 MED ORDER — LORATADINE 10 MG PO TABS
10.0000 mg | ORAL_TABLET | Freq: Once | ORAL | Status: DC
  Filled 2021-11-04: qty 1

## 2021-11-04 NOTE — Patient Instructions (Signed)
[facility]  Discharge Instructions: Thank you for choosing [facility] to provide your oncology and hematology care.  If you have a lab appointment with the Cancer Center, please come in thru the Main Entrance and check in at the main information desk.  Wear comfortable clothing and clothing appropriate for easy access to any Portacath or PICC line.   We strive to give you quality time with your provider. You may need to reschedule your appointment if you arrive late (15 or more minutes).  Arriving late affects you and other patients whose appointments are after yours.  Also, if you miss three or more appointments without notifying the office, you may be dismissed from the clinic at the provider's discretion.      For prescription refill requests, have your pharmacy contact our office and allow 72 hours for refills to be completed.    Today you received the following chemotherapy and/or immunotherapy agents Venofer      To help prevent nausea and vomiting after your treatment, we encourage you to take your nausea medication as directed.  BELOW ARE SYMPTOMS THAT SHOULD BE REPORTED IMMEDIATELY: *FEVER GREATER THAN 100.4 F (38 C) OR HIGHER *CHILLS OR SWEATING *NAUSEA AND VOMITING THAT IS NOT CONTROLLED WITH YOUR NAUSEA MEDICATION *UNUSUAL SHORTNESS OF BREATH *UNUSUAL BRUISING OR BLEEDING *URINARY PROBLEMS (pain or burning when urinating, or frequent urination) *BOWEL PROBLEMS (unusual diarrhea, constipation, pain near the anus) TENDERNESS IN MOUTH AND THROAT WITH OR WITHOUT PRESENCE OF ULCERS (sore throat, sores in mouth, or a toothache) UNUSUAL RASH, SWELLING OR PAIN  UNUSUAL VAGINAL DISCHARGE OR ITCHING   Items with * indicate a potential emergency and should be followed up as soon as possible or go to the Emergency Department if any problems should occur.  Please show the CHEMOTHERAPY ALERT CARD or IMMUNOTHERAPY ALERT CARD at check-in to the Emergency  Department and triage nurse.  Should you have questions after your visit or need to cancel or reschedule your appointment, please contact [facility] [phone]  and follow the prompts.  Office hours are 8:00 a.m. to 4:30 p.m. Monday - Friday. Please note that voicemails left after 4:00 p.m. may not be returned until the following business day.  We are closed weekends and major holidays. You have access to a nurse at all times for urgent questions. Please call the main number to the clinic [phone] and follow the prompts.  For any non-urgent questions, you may also contact your provider using MyChart. We now offer e-Visits for anyone 18 and older to request care online for non-urgent symptoms. For details visit [email or web address].   Also download the MyChart app! Go to the app store, search "MyChart", open the app, select , and log in with your MyChart username and password.  Masks are optional in the cancer centers. If you would like for your care team to wear a mask while they are taking care of you, please let them know. For doctor visits, patients may have with them one support person who is at least 82 years old. At this time, visitors are not allowed in the infusion area.  

## 2021-11-04 NOTE — Progress Notes (Signed)
Patient presents today for Venofer infusion per providers order.  Vital signs WNL.  Patient has no new complaints at this time.  Peripheral IV started and blood return noted pre and post infusion.  Venofer given today per MD orders.  Stable during infusion without adverse affects.  Vital signs stable.  No complaints at this time.  Discharge from clinic ambulatory in stable condition.  Alert and oriented X 3.  Follow up with [facility] as scheduled.  

## 2021-11-11 ENCOUNTER — Inpatient Hospital Stay (HOSPITAL_COMMUNITY): Payer: Medicare HMO

## 2021-11-11 VITALS — BP 151/49 | HR 68 | Temp 97.2°F | Resp 18

## 2021-11-11 DIAGNOSIS — D509 Iron deficiency anemia, unspecified: Secondary | ICD-10-CM | POA: Diagnosis not present

## 2021-11-11 DIAGNOSIS — D500 Iron deficiency anemia secondary to blood loss (chronic): Secondary | ICD-10-CM

## 2021-11-11 MED ORDER — ACETAMINOPHEN 325 MG PO TABS
650.0000 mg | ORAL_TABLET | Freq: Once | ORAL | Status: AC
  Administered 2021-11-11: 650 mg via ORAL
  Filled 2021-11-11: qty 2

## 2021-11-11 MED ORDER — SODIUM CHLORIDE 0.9 % IV SOLN
300.0000 mg | Freq: Once | INTRAVENOUS | Status: AC
  Administered 2021-11-11: 300 mg via INTRAVENOUS
  Filled 2021-11-11: qty 10

## 2021-11-11 MED ORDER — SODIUM CHLORIDE 0.9 % IV SOLN: Freq: Once | INTRAVENOUS | Status: AC

## 2021-11-11 NOTE — Patient Instructions (Signed)
[facility]  Discharge Instructions: Thank you for choosing [facility] to provide your oncology and hematology care.  If you have a lab appointment with the Cancer Center, please come in thru the Main Entrance and check in at the main information desk.  Wear comfortable clothing and clothing appropriate for easy access to any Portacath or PICC line.   We strive to give you quality time with your provider. You may need to reschedule your appointment if you arrive late (15 or more minutes).  Arriving late affects you and other patients whose appointments are after yours.  Also, if you miss three or more appointments without notifying the office, you may be dismissed from the clinic at the provider's discretion.      For prescription refill requests, have your pharmacy contact our office and allow 72 hours for refills to be completed.         To help prevent nausea and vomiting after your treatment, we encourage you to take your nausea medication as directed.  BELOW ARE SYMPTOMS THAT SHOULD BE REPORTED IMMEDIATELY: *FEVER GREATER THAN 100.4 F (38 C) OR HIGHER *CHILLS OR SWEATING *NAUSEA AND VOMITING THAT IS NOT CONTROLLED WITH YOUR NAUSEA MEDICATION *UNUSUAL SHORTNESS OF BREATH *UNUSUAL BRUISING OR BLEEDING *URINARY PROBLEMS (pain or burning when urinating, or frequent urination) *BOWEL PROBLEMS (unusual diarrhea, constipation, pain near the anus) TENDERNESS IN MOUTH AND THROAT WITH OR WITHOUT PRESENCE OF ULCERS (sore throat, sores in mouth, or a toothache) UNUSUAL RASH, SWELLING OR PAIN  UNUSUAL VAGINAL DISCHARGE OR ITCHING   Items with * indicate a potential emergency and should be followed up as soon as possible or go to the Emergency Department if any problems should occur.  Please show the CHEMOTHERAPY ALERT CARD or IMMUNOTHERAPY ALERT CARD at check-in to the Emergency Department and triage nurse.  Should you have questions after your visit or need to  cancel or reschedule your appointment, please contact [facility] [phone]  and follow the prompts.  Office hours are 8:00 a.m. to 4:30 p.m. Monday - Friday. Please note that voicemails left after 4:00 p.m. may not be returned until the following business day.  We are closed weekends and major holidays. You have access to a nurse at all times for urgent questions. Please call the main number to the clinic [phone] and follow the prompts.  For any non-urgent questions, you may also contact your provider using MyChart. We now offer e-Visits for anyone 18 and older to request care online for non-urgent symptoms. For details visit [email or web address].   Also download the MyChart app! Go to the app store, search "MyChart", open the app, select [facility], and log in with your MyChart username and password.  Masks are optional in the cancer centers. If you would like for your care team to wear a mask while they are taking care of you, please let them know. For doctor visits, patients may have with them one support person who is at least 82 years old. At this time, visitors are not allowed in the infusion area.  

## 2021-11-11 NOTE — Progress Notes (Signed)
Patient presents today for iron infusion.  Patient is in satisfactory condition with no complaints voiced.  Vital signs are stable.  We will proceed with infusion per MD orders.   Patient tolerated treatment well with no complaints voiced.  Patient left ambulatory in stable condition.  Vital signs stable at discharge.  Follow up as scheduled.

## 2021-11-22 ENCOUNTER — Inpatient Hospital Stay: Payer: Medicare HMO | Attending: Hematology

## 2021-11-22 VITALS — BP 124/46 | HR 55 | Temp 97.9°F | Resp 18

## 2021-11-22 DIAGNOSIS — D472 Monoclonal gammopathy: Secondary | ICD-10-CM | POA: Insufficient documentation

## 2021-11-22 DIAGNOSIS — D500 Iron deficiency anemia secondary to blood loss (chronic): Secondary | ICD-10-CM | POA: Insufficient documentation

## 2021-11-22 MED ORDER — SODIUM CHLORIDE 0.9 % IV SOLN
300.0000 mg | Freq: Once | INTRAVENOUS | Status: AC
  Administered 2021-11-22: 300 mg via INTRAVENOUS
  Filled 2021-11-22: qty 300

## 2021-11-22 MED ORDER — SODIUM CHLORIDE 0.9 % IV SOLN: Freq: Once | INTRAVENOUS | Status: AC

## 2021-11-22 MED ORDER — ACETAMINOPHEN 325 MG PO TABS
650.0000 mg | ORAL_TABLET | Freq: Once | ORAL | Status: AC
  Administered 2021-11-22: 650 mg via ORAL

## 2021-11-22 MED ORDER — LORATADINE 10 MG PO TABS: 10.0000 mg | ORAL_TABLET | Freq: Once | ORAL | Status: DC

## 2021-11-22 NOTE — Patient Instructions (Signed)
[facility]  Discharge Instructions: Thank you for choosing [facility] to provide your oncology and hematology care.  If you have a lab appointment with the Cancer Center, please come in thru the Main Entrance and check in at the main information desk.  Wear comfortable clothing and clothing appropriate for easy access to any Portacath or PICC line.   We strive to give you quality time with your provider. You may need to reschedule your appointment if you arrive late (15 or more minutes).  Arriving late affects you and other patients whose appointments are after yours.  Also, if you miss three or more appointments without notifying the office, you may be dismissed from the clinic at the provider's discretion.      For prescription refill requests, have your pharmacy contact our office and allow 72 hours for refills to be completed.    Today you received the following chemotherapy and/or immunotherapy agents Venofer      To help prevent nausea and vomiting after your treatment, we encourage you to take your nausea medication as directed.  BELOW ARE SYMPTOMS THAT SHOULD BE REPORTED IMMEDIATELY: *FEVER GREATER THAN 100.4 F (38 C) OR HIGHER *CHILLS OR SWEATING *NAUSEA AND VOMITING THAT IS NOT CONTROLLED WITH YOUR NAUSEA MEDICATION *UNUSUAL SHORTNESS OF BREATH *UNUSUAL BRUISING OR BLEEDING *URINARY PROBLEMS (pain or burning when urinating, or frequent urination) *BOWEL PROBLEMS (unusual diarrhea, constipation, pain near the anus) TENDERNESS IN MOUTH AND THROAT WITH OR WITHOUT PRESENCE OF ULCERS (sore throat, sores in mouth, or a toothache) UNUSUAL RASH, SWELLING OR PAIN  UNUSUAL VAGINAL DISCHARGE OR ITCHING   Items with * indicate a potential emergency and should be followed up as soon as possible or go to the Emergency Department if any problems should occur.  Please show the CHEMOTHERAPY ALERT CARD or IMMUNOTHERAPY ALERT CARD at check-in to the Emergency  Department and triage nurse.  Should you have questions after your visit or need to cancel or reschedule your appointment, please contact [facility] [phone]  and follow the prompts.  Office hours are 8:00 a.m. to 4:30 p.m. Monday - Friday. Please note that voicemails left after 4:00 p.m. may not be returned until the following business day.  We are closed weekends and major holidays. You have access to a nurse at all times for urgent questions. Please call the main number to the clinic [phone] and follow the prompts.  For any non-urgent questions, you may also contact your provider using MyChart. We now offer e-Visits for anyone 18 and older to request care online for non-urgent symptoms. For details visit [email or web address].   Also download the MyChart app! Go to the app store, search "MyChart", open the app, select [facility], and log in with your MyChart username and password.  Masks are optional in the cancer centers. If you would like for your care team to wear a mask while they are taking care of you, please let them know. For doctor visits, patients may have with them one support person who is at least 82 years old. At this time, visitors are not allowed in the infusion area.  

## 2021-11-22 NOTE — Progress Notes (Signed)
Patient presents today for Venofer infusion per providers order.  Vital signs WNL.  Patient has no new complaints at this time.  Patient took Claritin at home prior to infusion appt.  Peripheral IV started and blood return noted pre and post infusion.  Venofer given today per MD orders.  Stable during infusion without adverse affects.  Vital signs stable.  No complaints at this time.  Discharge from clinic ambulatory in stable condition.  Alert and oriented X 3.  Follow up with [facility] as scheduled.

## 2022-01-16 ENCOUNTER — Other Ambulatory Visit: Payer: Self-pay

## 2022-01-16 ENCOUNTER — Emergency Department (HOSPITAL_COMMUNITY)
Admission: EM | Admit: 2022-01-16 | Discharge: 2022-01-16 | Disposition: A | Discharge status: Home / Self Care | Payer: Medicare HMO | Attending: Emergency Medicine | Admitting: Emergency Medicine

## 2022-01-16 ENCOUNTER — Encounter (HOSPITAL_COMMUNITY): Payer: Self-pay | Admitting: *Deleted

## 2022-01-16 ENCOUNTER — Emergency Department (HOSPITAL_COMMUNITY): Payer: Medicare HMO

## 2022-01-16 DIAGNOSIS — M545 Low back pain, unspecified: Secondary | ICD-10-CM | POA: Diagnosis not present

## 2022-01-16 DIAGNOSIS — I10 Essential (primary) hypertension: Secondary | ICD-10-CM | POA: Insufficient documentation

## 2022-01-16 DIAGNOSIS — Z7982 Long term (current) use of aspirin: Secondary | ICD-10-CM | POA: Diagnosis not present

## 2022-01-16 DIAGNOSIS — R0789 Other chest pain: Secondary | ICD-10-CM | POA: Insufficient documentation

## 2022-01-16 DIAGNOSIS — M79605 Pain in left leg: Secondary | ICD-10-CM | POA: Insufficient documentation

## 2022-01-16 DIAGNOSIS — R079 Chest pain, unspecified: Secondary | ICD-10-CM

## 2022-01-16 DIAGNOSIS — G8929 Other chronic pain: Secondary | ICD-10-CM | POA: Insufficient documentation

## 2022-01-16 DIAGNOSIS — Z79899 Other long term (current) drug therapy: Secondary | ICD-10-CM | POA: Diagnosis not present

## 2022-01-16 LAB — CBG MONITORING, ED: Glucose-Capillary: 103 mg/dL — ABNORMAL HIGH (ref 70–99)

## 2022-01-16 LAB — CBC
HCT: 44.7 % (ref 36.0–46.0)
Hemoglobin: 14.5 g/dL (ref 12.0–15.0)
MCH: 28.5 pg (ref 26.0–34.0)
MCHC: 32.4 g/dL (ref 30.0–36.0)
MCV: 88 fL (ref 80.0–100.0)
Platelets: 175 10*3/uL (ref 150–400)
RBC: 5.08 MIL/uL (ref 3.87–5.11)
RDW: 15.3 % (ref 11.5–15.5)
WBC: 4.9 10*3/uL (ref 4.0–10.5)
nRBC: 0 % (ref 0.0–0.2)

## 2022-01-16 LAB — BASIC METABOLIC PANEL
Anion gap: 9 (ref 5–15)
BUN: 18 mg/dL (ref 8–23)
CO2: 30 mmol/L (ref 22–32)
Calcium: 9.8 mg/dL (ref 8.9–10.3)
Chloride: 100 mmol/L (ref 98–111)
Creatinine, Ser: 1.04 mg/dL — ABNORMAL HIGH (ref 0.44–1.00)
GFR, Estimated: 54 mL/min — ABNORMAL LOW (ref >60)
Glucose, Bld: 138 mg/dL — ABNORMAL HIGH (ref 70–99)
Potassium: 3.3 mmol/L — ABNORMAL LOW (ref 3.5–5.1)
Sodium: 139 mmol/L (ref 135–145)

## 2022-01-16 LAB — TROPONIN I (HIGH SENSITIVITY)
Troponin I (High Sensitivity): 5 ng/L (ref <18)
Troponin I (High Sensitivity): 5 ng/L (ref <18)

## 2022-01-16 MED ORDER — ASPIRIN 81 MG PO CHEW
324.0000 mg | CHEWABLE_TABLET | Freq: Once | ORAL | Status: AC
  Administered 2022-01-16: 324 mg via ORAL
  Filled 2022-01-16: qty 4

## 2022-01-16 NOTE — Discharge Instructions (Signed)
All of your test look normal, there is no signs of heart attack, the blood work is unremarkable.  I would like for you to take the following medication  Please take Naprosyn, '500mg'$  by mouth twice daily as needed for pain - this in an antiinflammatory medicine (NSAID) and is similar to ibuprofen - many people feel that it is stronger than ibuprofen and it is easier to take since it is a smaller pill.  Please use this only for 1 week - if your pain persists, you will need to follow up with your doctor in the office for ongoing guidance and pain control.  Thank you for allowing [place] to treat you in the emergency department today.  After reviewing your examination and potential testing that was done it appears that you are safe to go home.  I would like for you to follow-up with your doctor within the next several days, have them obtain your results and follow-up with them to review all of these tests.  If you should develop severe or worsening symptoms return to the emergency department immediately

## 2022-01-16 NOTE — ED Triage Notes (Signed)
Pt c/o chest pain, back pain and left leg pain;

## 2022-01-16 NOTE — ED Provider Notes (Signed)
[facility] EMERGENCY DEPARTMENT Provider Note   CSN: [id number] Arrival date & time: 01/16/22  7824     History  Chief Complaint  Patient presents with   Chest Pain    Stacey Riley is a 81 y.o. female.  HPI   This patient is an 81 year old female, she has no prior coronary disease.  She does report that she has a history of mild bradycardia but has been released by the cardiologist, without the need for ongoing treatment of any heart issues.  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  She reports this morning she was having some chest discomfort, started around 2 in the morning, she is not actively having chest pain.  She has no pain with exertion in general and states that she has occasional chest pains from time to time.  She is not short of breath, she is not having fevers, she has chronic pain in her left leg and her lower back which has been going on for a long time.  No edema.    Home Medications Prior to Admission medications   Medication Sig Start Date End Date Taking? Authorizing Provider  acetaminophen (TYLENOL) 500 MG tablet Take 500 mg by mouth daily as needed for headache.   Yes [provider]  albuterol (VENTOLIN HFA) 108 (90 Base) MCG/ACT inhaler Inhale 2 puffs into the lungs every 6 (six) hours as needed for wheezing or shortness of breath.   Yes [provider]  Ascorbic Acid (VITAMIN C) 100 MG tablet Take 100 mg by mouth daily.   Yes [provider]  aspirin 81 MG tablet Take 81 mg by mouth once a week.    Yes [provider]  B Complex Vitamins (VITAMIN B COMPLEX) TABS See  admin instructions.   Yes [provider]  Cyanocobalamin (B-12 PO) Take 1 tablet by mouth daily.   Yes [provider]  Famotidine-Ca Carb-Mag Hydrox (PEPCID COMPLETE PO) Take 1 tablet by mouth as needed (indigestion).   Yes [provider]  ferrous sulfate 325 (65 FE) MG EC tablet Take 1 tablet (325 mg total) by mouth daily with breakfast. 04/25/21  Yes Pennington, Rebekah M, PA-C  hydrochlorothiazide (HYDRODIURIL) 25 MG tablet Take 25 mg by mouth daily. 04/22/11  Yes [provider]  lisinopril (PRINIVIL,ZESTRIL) 40 MG tablet Take 40 mg by mouth daily. 04/22/11  Yes [provider]  loratadine (CLARITIN) 10 MG tablet Take 1 tablet (10 mg total) by mouth daily. 02/16/16  Yes Thurnell Lose, MD  Multiple Vitamins-Minerals (VITAMIN D3 COMPLETE) TABS See admin instructions.   Yes [provider]  nitroGLYCERIN (NITROSTAT) 0.4 MG SL tablet Place 1 tablet (0.4 mg total) under the tongue every 5 (five) minutes as needed for chest pain. 11/19/17  Yes Strader, [place] M, PA-C  pantoprazole (PROTONIX) 40 MG tablet Take 1 tablet (40 mg total) by mouth daily. 06/21/18  Yes Herminio Commons, MD  pravastatin (PRAVACHOL) 20 MG tablet Take 20 mg by mouth at bedtime.   Yes [provider]  VITAMIN D PO Take 1 tablet by mouth daily.   Yes [provider]  amLODipine (  NORVASC) 10 MG tablet Take 1 tablet (10 mg total) by mouth daily. Patient taking differently: Take 10 mg by mouth daily. 03/15/20 10/28/21  Erma Heritage, PA-C  TRUE METRIX BLOOD GLUCOSE TEST test strip  06/07/18   [provider]      Allergies    Augmentin [amoxicillin-pot clavulanate], Amoxicillin, Clavulanic acid, Penicillin g, and Sulfa antibiotics    Review of Systems   Review of Systems  All other systems reviewed and are negative.   Physical Exam Updated Vital Signs BP (!) 157/51   Pulse (!) 49   Temp 97.7 F (36.5 C) (Oral)   Resp 19   SpO2 96%   Physical Exam Vitals and nursing note reviewed.  Constitutional:      General: She is not in acute distress.    Appearance: She is well-developed.  HENT:     Head: Normocephalic and atraumatic.     Mouth/Throat:     Pharynx: No oropharyngeal exudate.  Eyes:     General: No scleral icterus.       Right eye: No discharge.        Left eye: No discharge.     Conjunctiva/sclera: Conjunctivae normal.     Pupils: Pupils are equal, round, and reactive to light.  Neck:     Thyroid: No thyromegaly.     Vascular: No JVD.  Cardiovascular:     Rate and Rhythm: Normal rate and regular rhythm.     Heart sounds: Normal heart sounds. No murmur heard.    No friction rub. No gallop.  Pulmonary:     Effort: Pulmonary effort is normal. No respiratory distress.     Breath sounds: Normal breath sounds. No wheezing or rales.  Abdominal:     General: Bowel sounds are normal. There is no distension.     Palpations: Abdomen is soft. There is no mass.     Tenderness: There is no abdominal tenderness.  Musculoskeletal:        General: No tenderness. Normal range of motion.     Cervical back: Normal range of motion and neck supple.     Right lower leg: No edema.     Left lower leg: No edema.  Lymphadenopathy:     Cervical: No cervical adenopathy.  Skin:    General: Skin is warm and dry.     Findings: No erythema or rash.  Neurological:     Mental Status: She is alert.     Coordination: Coordination normal.  Psychiatric:        Behavior: Behavior normal.     ED Results / Procedures / Treatments   Labs (all labs ordered are listed, but only abnormal results are displayed) Labs Reviewed  BASIC METABOLIC PANEL - Abnormal; Notable for the following components:      Result Value   Potassium 3.3 (*)    Glucose, Bld 138 (*)    Creatinine, Ser 1.04 (*)    GFR, Estimated 54 (*)    All other components within normal limits  CBG MONITORING, ED - Abnormal; Notable for the following components:    Glucose-Capillary 103 (*)    All other components within normal limits  CBC  TROPONIN I (HIGH SENSITIVITY)  TROPONIN I (HIGH SENSITIVITY)    EKG EKG Interpretation  Date/Time:  Thursday January 16 2022 10:19:57 EDT Ventricular Rate:  54 PR Interval:  156 QRS Duration: 123 QT Interval:  452 QTC Calculation: 429 R Axis:   -2 Text Interpretation: Sinus rhythm Left bundle  branch block Since last tracing rate slower Confirmed by Noemi Chapel [phone] on 01/16/2022 10:55:16 AM  Radiology DG Chest 2 View  Result Date: 01/16/2022 CLINICAL DATA:  Chest pain, back pain EXAM: CHEST - 2 VIEW COMPARISON:  05/13/2018 FINDINGS: Cardiac size is within normal limits. Lung fields are clear of any pulmonary edema or new focal infiltrates. There is no pleural effusion or pneumothorax. Large fixed hiatal hernia is seen. IMPRESSION: There are no signs of pulmonary edema or focal pulmonary consolidation. Fixed hiatal hernia. Electronically Signed   By: Elmer Picker M.D.   On: 01/16/2022 10:57    Procedures Procedures    Medications Ordered in ED Medications  aspirin chewable tablet 324 mg (324 mg Oral Given 01/16/22 1122)    ED Course/ Medical Decision Making/ A&P                           Medical Decision Making Amount and/or Complexity of Data Reviewed Labs: ordered. Radiology: ordered.  Risk OTC drugs.   This patient presents to the ED for concern of chest pain and chronic leg pain, this involves an extensive number of treatment options, and is a complaint that carries with it a high risk of complications and morbidity.  The differential diagnosis includes we will evaluate for cardiac cause though the patient does not have typical exertional symptoms.  She has vital signs which are unremarkable except for some minimal hypertension.  Her heart rate is usually mildly bradycardic which is not unusual for her.  We will obtain a chest x-ray and labs   Co morbidities that complicate the  patient evaluation  History of hypertension on amlodipine, cholesterol on pravastatin, she is on lisinopril and hydrochlorothiazide as well   Additional history obtained:  Additional history obtained from electronic medical record External records from outside source obtained and reviewed including prior stress test as above   Lab Tests:  I Ordered, and personally interpreted labs.  The pertinent results include: Troponin negative x2, metabolic panel and CBC unremarkable   Imaging Studies ordered:  I ordered imaging studies including chest x-ray I independently visualized and interpreted imaging which showed no acute findings I agree with the radiologist interpretation   Cardiac Monitoring: / EKG:  The patient was maintained on a cardiac monitor.  I personally viewed and interpreted the cardiac monitored which showed an underlying rhythm of: Mild sinus bradycardia, chronic   Consultations Obtained:  Not indicated, negative work-up, atypical pain   Problem List / ED Course / Critical interventions / Medication management  Pain, patient well-appearing, has negative work-up including 2 negative troponins, x-rays also unremarkable, I ordered medication including Naprosyn for home Reevaluation of the patient after these medicines showed that the patient improved I have reviewed the patients home medicines and have made adjustments as needed   Social Determinants of Health:  None   Test / Admission - Considered:  Considered admission but patient low risk and stable for discharge with negative work-up.  The majority of her pain is more related to her back and her leg which seems more chronic and may be radicular but at this time is not causing any neurologic deficits.  Chest pain has been ongoing for over 10 hours with 2 negative troponins making cardiac disease extremely unlikely         Final Clinical Impression(s) / ED Diagnoses Final diagnoses:  Chest pain at  rest    Rx / DC Orders ED Discharge Orders  None         Noemi Chapel, MD 01/16/22 [phone]

## 2022-04-22 ENCOUNTER — Ambulatory Visit: Payer: Medicare HMO | Admitting: Internal Medicine

## 2022-04-30 NOTE — Progress Notes (Signed)
GI Office Note    Referring Provider: [facility]* Primary Care Physician:  [facility]: Cristopher Estimable.Rourk, MD  Date:  05/01/2022  ID:  Stacey Riley, DOB [birth year], MRN [id number]   Chief Complaint   Chief Complaint  Patient presents with   Follow-up    Hiatal hernia is sore sometimes.    History of Present Illness  Stacey Riley is a 82 y.o. 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  Last seen 10/30/2021.  Subsequently received 3 iron infusions.  Last CBC 01/16/2022 with hemoglobin 14.5.  No recent iron panel on file.  Last office visit 10/28/2021.  GERD fairly well-controlled, has occasional tenderness likely due to hernia especially with bending over.  Takes Pepcid as needed.  Usually takes empirically prior to dietary indiscretion.  Denied dysphagia, nausea, vomiting, lack of appetite, early satiety.  Blood pressure was elevated however she suspected to be due to anxiety from driving.  Advised to wean pantoprazole to every other day for 2 months and then stop taking and monitor for return of symptoms.  Recommendations given per patient's request to wean off medication.  Today: GERD: Weaned off medication. Not really by it anymore. Will take a pepcid as needed and states she has felt better since then. No nausea, vomiting.   Doing okay. Having a little pain here or there in her lower abdomen. Pain goes and comes and sometimes it is not bothersome. Reports  regular BM. Usually happens with a lot of bending. No diarrhea. No melena or hematochezia. No shortness of breath, edema, chest pain.   Only has some occasional back pain. Got COVID in December and was sick at Christmas. Saw her family for the holidays the weekend after Christmas.    Current Outpatient Medications  Medication Sig Dispense Refill   acetaminophen (TYLENOL) 500 MG tablet Take 500 mg by mouth daily as needed for headache.     albuterol (VENTOLIN HFA) 108 (90 Base) MCG/ACT inhaler Inhale 2 puffs into the lungs every 6 (six) hours as needed for wheezing or shortness of breath.     amLODipine (NORVASC) 10 MG tablet Take 1 tablet (10 mg total) by mouth daily. (Patient taking differently: Take 10 mg by mouth daily.) 30 tablet 0   Ascorbic Acid (VITAMIN C) 100 MG tablet Take 100 mg by mouth daily.     aspirin 81 MG tablet Take 81 mg by mouth once a week.      B Complex Vitamins (VITAMIN B COMPLEX) TABS See admin instructions.     Blood Glucose Calibration (TRUE METRIX LEVEL 1) Low SOLN      Cyanocobalamin (B-12 PO) Take 1 tablet by mouth daily.     Famotidine-Ca Carb-Mag Hydrox (PEPCID COMPLETE PO) Take 1 tablet by mouth as needed (indigestion).     ferrous sulfate 325 (65 FE) MG EC tablet Take 1 tablet (325 mg total) by mouth daily with breakfast. 90 tablet 3   hydrochlorothiazide (HYDRODIURIL) 25 MG tablet Take 25 mg by mouth daily.     lisinopril (PRINIVIL,ZESTRIL) 40 MG tablet  Take 40 mg by mouth daily.     loratadine (CLARITIN) 10 MG tablet Take 1 tablet (10 mg total) by mouth daily. 30 tablet 0   Multiple Vitamins-Minerals (VITAMIN D3 COMPLETE) TABS See admin instructions.     nitroGLYCERIN (NITROSTAT) 0.4 MG SL tablet Place 1 tablet (0.4 mg total) under the tongue every 5 (five) minutes as needed for chest pain. 25 tablet 3   pravastatin (PRAVACHOL) 20 MG tablet Take 20 mg by mouth at bedtime.     TRUE METRIX BLOOD GLUCOSE TEST test strip      VITAMIN D PO Take 1 tablet by mouth  daily.     pantoprazole (PROTONIX) 40 MG tablet Take 1 tablet (40 mg total) by mouth daily. (Patient not taking: Reported on 05/01/2022) 90 tablet 3   No current facility-administered medications for this visit.    Past Medical History:  Diagnosis Date   Anemia 03/14/2016   Arthritis    Asthma    Bradycardia    DM (diabetes mellitus) (HCC)    GERD (gastroesophageal reflux disease)    High cholesterol    HTN (hypertension)     Past Surgical History:  Procedure Laterality Date   CHOLECYSTECTOMY  1990s   COLONOSCOPY  03/18/1999   Dr Rourk-int  hemorrhoids, pancolonic diverticula   COLONOSCOPY N/A 09/26/2016   Dr. Gala Romney: Diverticulosis, 4 mm polyp removed from the descending colon which is a tubular adenoma.  No future colonoscopies unless new symptoms arise.   COLONOSCOPY WITH ESOPHAGOGASTRODUODENOSCOPY (EGD)  08/06/2011   Rourk: Schatzki ring, moderate sized hiatal hernia status post dilation for history of dysphagia. Pancolonic diverticula, single diminutive polyp at the base of the cecum removed (tubular adenoma). Next colonoscopy recommended for April 2018   ESOPHAGOGASTRODUODENOSCOPY  07/02/01   Dr Rourk-Schatzki's ring s/p 39F maloney dilation, otherwise normal/small hiatal hernia/   Linear erosion and ulceration in the proximal stomach/  The ulcerated lesion in the proximal stomach  benign.This may be a Stacey Riley lesion secondary to trauma to the mucosa, straddling the  diaphragmatic hiatus in the presence of a hiatal hernia    ESOPHAGOGASTRODUODENOSCOPY N/A 02/14/2016   Dr. Gala Romney: Large hiatal hernia, Stacey Riley lesion likely explains bleeding.   MALONEY DILATION  08/06/2011   Procedure: Venia Minks DILATION;  Surgeon: Daneil Dolin, MD;  Location: [facility];  Service: Endoscopy;  Laterality: N/A;   POLYPECTOMY  09/26/2016   Procedure: POLYPECTOMY;  Surgeon: Daneil Dolin, MD;  Location: [facility];  Service: Endoscopy;;  descending colon   TUBAL LIGATION      Family History   Problem Relation Age of Onset   Aneurysm Daughter    Stroke Daughter    Diabetes Daughter    Hypertension Mother    Dementia Mother    Hypertension Father    Kidney cancer Brother    Cancer Brother        brain   Cancer Brother        bone   Stroke Brother    Hypertension Daughter    Hypertension Son    Hyperlipidemia Son    Colon cancer Neg Hx    Inflammatory bowel disease Neg Hx     Allergies as of 05/01/2022 - Review Complete 05/01/2022  Allergen Reaction Noted   Augmentin [amoxicillin-pot clavulanate] Nausea And Vomiting 02/14/2016   Amoxicillin Other (See Comments) 05/05/2019   Clavulanic acid Nausea And Vomiting 07/08/2017   Penicillin g Nausea And Vomiting 07/08/2017   Sulfa antibiotics Swelling 07/14/2011    Social  History   Socioeconomic History   Marital status: Divorced    Spouse name: Not on file   Number of children: 5   Years of education: Not on file   Highest education level: Not on file  Occupational History   Occupation: retired; Charity fundraiser  Tobacco Use   Smoking status: Never   Smokeless tobacco: Never  Vaping Use   Vaping Use: Never used  Substance and Sexual Activity   Alcohol use: No   Drug use: No   Sexual activity: Not on file  Other Topics Concern   Not on file  Social History Narrative   Lives alone   Social Determinants of Health   Financial Resource Strain: Low Risk  (03/19/2020)   Overall Financial Resource Strain (CARDIA)    Difficulty of Paying Living Expenses: Not hard at all  Food Insecurity: No Food Insecurity (03/19/2020)   Hunger Vital Sign    Worried About Running Out of Food in the Last Year: Never true    [place]t in the Last Year: Never true  Transportation Needs: No Transportation Needs (03/19/2020)   PRAPARE - Hydrologist (Medical): No    Lack of Transportation (Non-Medical): No  Physical Activity: Inactive (03/19/2020)   Exercise Vital Sign    Days of Exercise per Week: 0  days    Minutes of Exercise per Session: 0 min  Stress: No Stress Concern Present (03/19/2020)   [place]    Feeling of Stress : Not at all  Social Connections: Moderately Isolated (03/19/2020)   Social Connection and Isolation Panel [NHANES]    Frequency of Communication with Friends and Family: More than three times a week    Frequency of Social Gatherings with Friends and Family: Three times a week    Attends Religious Services: 1 to 4 times per year    Active Member of Clubs or Organizations: No    Attends Archivist Meetings: Never    Marital Status: Never married     Review of Systems   Gen: Denies fever, chills, anorexia. Denies fatigue, weakness, weight loss.  CV: Denies chest pain, palpitations, syncope, peripheral edema, and claudication. Resp: Denies dyspnea at rest, cough, wheezing, coughing up blood, and pleurisy. GI: See HPI Derm: Denies rash, itching, dry skin Psych: Denies depression, anxiety, memory loss, confusion. No homicidal or suicidal ideation.  Heme: Denies bruising, bleeding, and enlarged lymph nodes.   Physical Exam   BP (!) 145/74 (BP Location: Right Arm, Patient Position: Sitting, Cuff Size: Normal)   Pulse 74   Temp 97.7 F (36.5 C) (Temporal)   Ht '5\' 4"'$  (1.626 m)   Wt 160 lb 3.2 oz (72.7 kg)   SpO2 97%   BMI 27.50 kg/m   General:   Alert and oriented. No distress noted. Pleasant and cooperative.  Head:  Normocephalic and atraumatic. Eyes:  Conjuctiva clear without scleral icterus. Mouth:  Oral mucosa pink and moist. Good dentition. No lesions. Lungs:  Clear to auscultation bilaterally. No wheezes, rales, or rhonchi. No distress.  Heart:  S1, S2 present without murmurs appreciated.  Few premature beats heard (follow-up with cardiology next month) Abdomen:  +BS, soft, non-tender and non-distended. No rebound or guarding. No HSM or masses noted. Rectal: Msk:   Symmetrical without gross deformities. Normal posture. Extremities:  Without edema. Neurologic:  Alert and  oriented x4 Psych:  Alert and cooperative. Normal mood and affect.   Assessment  Stacey Riley is a 82 y.o. female with a history of asthma, diabetes, GERD, large hiatal hernia, HTN, anemia presenting today for follow-up  GERD, large hiatal hernia: Weaned off pantoprazole.  Very rare symptoms.  Using Pepcid as needed.  Denies any upper GI symptoms.  Has occasional soreness to her abdomen with a lot of bending over likely secondary to her hernia.  Anemia: Follows with heme/onc.  Last hemoglobin 14.5 in October 2023.  Received 3 iron infusions from July to August 2023.  Due for follow-up with heme/onc on 05/09/2022.  Going today to have blood work done for her appointment next week.  Denies any melena, BRBPR, fatigue.  PLAN   Continue Pepcid as needed.  Continue GERD diet Continue to follow with hematology. Follow up in 1 year or sooner if needed.     Venetia Night, MSN, FNP-BC, AGACNP-BC [facility]

## 2022-05-01 ENCOUNTER — Inpatient Hospital Stay: Payer: Medicare HMO | Attending: Hematology

## 2022-05-01 ENCOUNTER — Ambulatory Visit (INDEPENDENT_AMBULATORY_CARE_PROVIDER_SITE_OTHER): Payer: Medicare HMO | Admitting: Gastroenterology

## 2022-05-01 ENCOUNTER — Encounter: Payer: Self-pay | Admitting: Gastroenterology

## 2022-05-01 VITALS — BP 145/74 | HR 74 | Temp 97.7°F | Ht 64.0 in | Wt 160.2 lb

## 2022-05-01 DIAGNOSIS — E119 Type 2 diabetes mellitus without complications: Secondary | ICD-10-CM | POA: Diagnosis not present

## 2022-05-01 DIAGNOSIS — D500 Iron deficiency anemia secondary to blood loss (chronic): Secondary | ICD-10-CM | POA: Diagnosis not present

## 2022-05-01 DIAGNOSIS — K219 Gastro-esophageal reflux disease without esophagitis: Secondary | ICD-10-CM | POA: Diagnosis not present

## 2022-05-01 DIAGNOSIS — K449 Diaphragmatic hernia without obstruction or gangrene: Secondary | ICD-10-CM | POA: Diagnosis not present

## 2022-05-01 DIAGNOSIS — D472 Monoclonal gammopathy: Secondary | ICD-10-CM | POA: Diagnosis present

## 2022-05-01 DIAGNOSIS — D509 Iron deficiency anemia, unspecified: Secondary | ICD-10-CM | POA: Diagnosis not present

## 2022-05-01 DIAGNOSIS — I10 Essential (primary) hypertension: Secondary | ICD-10-CM | POA: Diagnosis not present

## 2022-05-01 LAB — LACTATE DEHYDROGENASE: LDH: 133 U/L (ref 98–192)

## 2022-05-01 LAB — COMPREHENSIVE METABOLIC PANEL
ALT: 18 U/L (ref 0–44)
AST: 21 U/L (ref 15–41)
Albumin: 4.5 g/dL (ref 3.5–5.0)
Alkaline Phosphatase: 50 U/L (ref 38–126)
Anion gap: 11 (ref 5–15)
BUN: 19 mg/dL (ref 8–23)
CO2: 29 mmol/L (ref 22–32)
Calcium: 9.4 mg/dL (ref 8.9–10.3)
Chloride: 97 mmol/L — ABNORMAL LOW (ref 98–111)
Creatinine, Ser: 0.92 mg/dL (ref 0.44–1.00)
GFR, Estimated: >60 mL/min (ref >60)
Glucose, Bld: 120 mg/dL — ABNORMAL HIGH (ref 70–99)
Potassium: 3.9 mmol/L (ref 3.5–5.1)
Sodium: 137 mmol/L (ref 135–145)
Total Bilirubin: 0.8 mg/dL (ref 0.3–1.2)
Total Protein: 8.1 g/dL (ref 6.5–8.1)

## 2022-05-01 LAB — CBC WITH DIFFERENTIAL/PLATELET
Abs Immature Granulocytes: 0.01 10*3/uL (ref 0.00–0.07)
Basophils Absolute: 0 10*3/uL (ref 0.0–0.1)
Basophils Relative: 1 %
Eosinophils Absolute: 0.1 10*3/uL (ref 0.0–0.5)
Eosinophils Relative: 1 %
HCT: 40.9 % (ref 36.0–46.0)
Hemoglobin: 13 g/dL (ref 12.0–15.0)
Immature Granulocytes: 0 %
Lymphocytes Relative: 45 %
Lymphs Abs: 2 10*3/uL (ref 0.7–4.0)
MCH: 28.7 pg (ref 26.0–34.0)
MCHC: 31.8 g/dL (ref 30.0–36.0)
MCV: 90.3 fL (ref 80.0–100.0)
Monocytes Absolute: 0.4 10*3/uL (ref 0.1–1.0)
Monocytes Relative: 9 %
Neutro Abs: 2 10*3/uL (ref 1.7–7.7)
Neutrophils Relative %: 44 %
Platelets: 169 10*3/uL (ref 150–400)
RBC: 4.53 MIL/uL (ref 3.87–5.11)
RDW: 13.9 % (ref 11.5–15.5)
WBC: 4.5 10*3/uL (ref 4.0–10.5)
nRBC: 0 % (ref 0.0–0.2)

## 2022-05-01 LAB — IRON AND TIBC
Iron: 64 ug/dL (ref 28–170)
Saturation Ratios: 20 % (ref 10.4–31.8)
TIBC: 326 ug/dL (ref 250–450)
UIBC: 262 ug/dL

## 2022-05-01 LAB — FERRITIN: Ferritin: 103 ng/mL (ref 11–307)

## 2022-05-01 NOTE — Patient Instructions (Signed)
Continue to take Pepcid as needed.  Follow a GERD diet:  Avoid fried, fatty, greasy, spicy, citrus foods. Avoid caffeine and carbonated beverages. Avoid chocolate. Try eating 4-6 small meals a day rather than 3 large meals. Do not eat within 3 hours of laying down. Prop head of bed up on wood or bricks to create a 6 inch incline.  Continue to follow with hematology.  You begin having any dark/black/tarry stools, bright red blood in your stools, develop abdominal pain or uncontrolled reflux please reach out otherwise we will plan to follow-up in 1 year.  It was a pleasure to see you today. I want to create trusting relationships with patients. If you receive a survey regarding your visit,  I greatly appreciate you taking time to fill this out on paper or through your MyChart. I value your feedback.  Venetia Night, MSN, FNP-BC, AGACNP-BC [facility]

## 2022-05-02 ENCOUNTER — Inpatient Hospital Stay: Payer: Medicare HMO

## 2022-05-02 LAB — KAPPA/LAMBDA LIGHT CHAINS
Kappa free light chain: 24.3 mg/L — ABNORMAL HIGH (ref 3.3–19.4)
Kappa, lambda light chain ratio: 1.28 (ref 0.26–1.65)
Lambda free light chains: 19 mg/L (ref 5.7–26.3)

## 2022-05-06 LAB — PROTEIN ELECTROPHORESIS, SERUM
A/G Ratio: 1.2 (ref 0.7–1.7)
Albumin ELP: 4.1 g/dL (ref 2.9–4.4)
Alpha-1-Globulin: 0.2 g/dL (ref 0.0–0.4)
Alpha-2-Globulin: 0.8 g/dL (ref 0.4–1.0)
Beta Globulin: 0.8 g/dL (ref 0.7–1.3)
Gamma Globulin: 1.6 g/dL (ref 0.4–1.8)
Globulin, Total: 3.4 g/dL (ref 2.2–3.9)
M-Spike, %: 0.8 g/dL — ABNORMAL HIGH
Total Protein ELP: 7.5 g/dL (ref 6.0–8.5)

## 2022-05-08 NOTE — Progress Notes (Signed)
[place], [place] [address]   CLINIC:  Medical Oncology/Hematology  PCP:  [facility] [place] [id number] [phone]   REASON FOR VISIT:  Follow-up for MGUS and iron deficiency anemia   PRIOR THERAPY: Intermittent parenteral iron therapy   CURRENT THERAPY: Surveillance, iron pill  INTERVAL HISTORY:   Stacey Riley 82 y.o. female returns for routine follow-up of her MGUS and iron deficiency anemia.  She was last seen in office by Tarri Abernethy PA-C on 10/30/2021.  At today's visit, she reports feeling well.  She had COVID in December 2023, but is feeling better now.  Otherwise, no recent hospitalizations, surgeries, or changes in baseline health status.  She denies any recent bleeding such as epistaxis, hematemesis, hematochezia, or melena. She reports that she has intermittent fatigue that depends on the day, with some days where she feels "real tired;" she notes decreased stamina and states that she tires quickly after her activities. She has occasional headaches.  She does not have any lightheadedness or syncope.  She does not have any complaints of pica, restless legs, chest pain, or dyspnea on exertion. She is taking iron tablet daily without any significant side effects.     She denies any new bone pain or recent fractures.  She reports occasional hot flashes and night sweats.  She denies any unexplained fever, chills, or weight loss.  No new neurologic symptoms such as tinnitus, new-onset hearing loss, blurred vision, headache, or dizziness.  She has some chronic and intermittent numbness / tingling in hands or feet. No thromboembolic events since her last visit.  No new masses or lymphadenopathy per her report.  She has 100% energy and 100% appetite. She endorses that she is maintaining a stable weight.   ASSESSMENT & PLAN:  1.  IgG lambda MGUS: - Diagnosed in 2018.  Levels have been  relatively stable since that time, with a 1% chance per year of progression to multiple myeloma. - Immunofixation confirms IgG monoclonal protein with lambda light chain specificity - Most recent skeletal survey (10/25/2021) negative for lytic or sclerotic lesions - Most recent myeloma panel (05/01/2022): M spike stable at 0.8; free light chains with mildly elevated kappa 24.3, normal lambda 19.0, normal ratio 1.28.  LDH normal. - Current labs negative for CRAB features: Hgb 12.5 13.0 creatinine 0.92, calcium 9.4 - No new bone pain or B symptoms. - PLAN: RTC in 6 months for repeat myeloma/MGUS panel.  Next skeletal survey due July 2024.   2.  Iron deficiency anemia: - Thought to be secondary to chronic GI blood loss - EGD (November 2017): Cameron lesions in the stomach, considered to be possible source of bleeding - Colonoscopy (09/26/2016): 4 mm polyp with pathology showing tubular adenoma - She is required intermittent IV iron, most recently with IV Venofer 300 mg x 3 in July/August 2023 - No epistaxis, hematemesis, hematochezia, or melena. - She has been taking ferrous sulfate daily since January 2023, which she is tolerating well  - Most recent labs (05/01/2022): Hgb 13.0, ferritin 103, iron saturation 20% - PLAN: No IV iron at this time. - Recommend patient that she continue iron pill daily. - Repeat CBC/iron panel and RTC in 6 months.  PLAN SUMMARY: >> Labs in 6 months (CBC/D, CMP, LDH, iron/TIBC, ferritin, SPEP, light chains) >> Skeletal survey in 6 months >> OFFICE visit in 6 months (1 week after labs)      REVIEW OF  SYSTEMS:   Review of Systems  Constitutional:  Negative for appetite change, chills, diaphoresis, fatigue, fever and unexpected weight change.  HENT:   Negative for lump/mass and nosebleeds.   Eyes:  Negative for eye problems.  Respiratory:  Negative for cough, hemoptysis and shortness of breath.   Cardiovascular:  Negative for chest pain, leg swelling and  palpitations.  Gastrointestinal:  Negative for abdominal pain, blood in stool, constipation, diarrhea, nausea and vomiting.  Genitourinary:  Positive for dysuria and frequency. Negative for hematuria.   Musculoskeletal:  Positive for arthralgias (left knee arthritis) and back pain.  Skin: Negative.   Neurological:  Positive for headaches (occasional) and numbness. Negative for dizziness and light-headedness.  Hematological:  Does not bruise/bleed easily.  Psychiatric/Behavioral:  Positive for sleep disturbance.      PHYSICAL EXAM:  ECOG PERFORMANCE STATUS: 0 - Asymptomatic  There were no vitals filed for this visit. There were no vitals filed for this visit. Physical Exam Constitutional:      Appearance: Normal appearance. She is obese.  HENT:     Head: Normocephalic and atraumatic.     Mouth/Throat:     Mouth: Mucous membranes are moist.  Eyes:     Extraocular Movements: Extraocular movements intact.     Pupils: Pupils are equal, round, and reactive to light.  Cardiovascular:     Rate and Rhythm: Regular rhythm. Bradycardia present.     Pulses: Normal pulses.     Heart sounds: Normal heart sounds.  Pulmonary:     Effort: Pulmonary effort is normal.     Breath sounds: Normal breath sounds.     Comments: Diminished bases. Abdominal:     General: Bowel sounds are normal.     Palpations: Abdomen is soft.     Tenderness: There is no abdominal tenderness.  Musculoskeletal:        General: No swelling.     Right lower leg: No edema.     Left lower leg: No edema.  Lymphadenopathy:     Cervical: No cervical adenopathy.  Skin:    General: Skin is warm and dry.  Neurological:     General: No focal deficit present.     Mental Status: She is alert and oriented to person, place, and time.  Psychiatric:        Mood and Affect: Mood normal.        Behavior: Behavior normal.     PAST MEDICAL/SURGICAL HISTORY:  Past Medical History:  Diagnosis Date   Anemia 03/14/2016    Arthritis    Asthma    Bradycardia    DM (diabetes mellitus) (HCC)    GERD (gastroesophageal reflux disease)    High cholesterol    HTN (hypertension)    Past Surgical History:  Procedure Laterality Date   CHOLECYSTECTOMY  1990s   COLONOSCOPY  03/18/1999   Dr Rourk-int  hemorrhoids, pancolonic diverticula   COLONOSCOPY N/A 09/26/2016   Dr. Gala Romney: Diverticulosis, 4 mm polyp removed from the descending colon which is a tubular adenoma.  No future colonoscopies unless new symptoms arise.   COLONOSCOPY WITH ESOPHAGOGASTRODUODENOSCOPY (EGD)  08/06/2011   Rourk: Schatzki ring, moderate sized hiatal hernia status post dilation for history of dysphagia. Pancolonic diverticula, single diminutive polyp at the base of the cecum removed (tubular adenoma). Next colonoscopy recommended for April 2018   ESOPHAGOGASTRODUODENOSCOPY  07/02/01   Dr Rourk-Schatzki's ring s/p 56F maloney dilation, otherwise normal/small hiatal hernia/   Linear erosion and ulceration in the proximal stomach/  The ulcerated  lesion in the proximal stomach  benign.This may be a [name] lesion secondary to trauma to the mucosa, straddling the  diaphragmatic hiatus in the presence of a hiatal hernia    ESOPHAGOGASTRODUODENOSCOPY N/A 02/14/2016   Dr. Gala Romney: Large hiatal hernia, [name] lesion likely explains bleeding.   MALONEY DILATION  08/06/2011   Procedure: Venia Minks DILATION;  Surgeon: Daneil Dolin, MD;  Location: [facility];  Service: Endoscopy;  Laterality: N/A;   POLYPECTOMY  09/26/2016   Procedure: POLYPECTOMY;  Surgeon: Daneil Dolin, MD;  Location: [facility];  Service: Endoscopy;;  descending colon   TUBAL LIGATION      SOCIAL HISTORY:  Social History   Socioeconomic History   Marital status: Divorced    Spouse name: Not on file   Number of children: 5   Years of education: Not on file   Highest education level: Not on file  Occupational History   Occupation: retired; Charity fundraiser  Tobacco Use   Smoking status:  Never   Smokeless tobacco: Never  Vaping Use   Vaping Use: Never used  Substance and Sexual Activity   Alcohol use: No   Drug use: No   Sexual activity: Not on file  Other Topics Concern   Not on file  Social History Narrative   Lives alone   Social Determinants of Health   Financial Resource Strain: Low Risk  (03/19/2020)   Overall Financial Resource Strain (CARDIA)    Difficulty of Paying Living Expenses: Not hard at all  Food Insecurity: No Food Insecurity (03/19/2020)   Hunger Vital Sign    Worried About Running Out of Food in the Last Year: Never true    [place] in the Last Year: Never true  Transportation Needs: No Transportation Needs (03/19/2020)   PRAPARE - Hydrologist (Medical): No    Lack of Transportation (Non-Medical): No  Physical Activity: Inactive (03/19/2020)   Exercise Vital Sign    Days of Exercise per Week: 0 days    Minutes of Exercise per Session: 0 min  Stress: No Stress Concern Present (03/19/2020)   [place]    Feeling of Stress : Not at all  Social Connections: Moderately Isolated (03/19/2020)   Social Connection and Isolation Panel [NHANES]    Frequency of Communication with Friends and Family: More than three times a week    Frequency of Social Gatherings with Friends and Family: Three times a week    Attends Religious Services: 1 to 4 times per year    Active Member of Clubs or Organizations: No    Attends Archivist Meetings: Never    Marital Status: Never married  Intimate Partner Violence: Not At Risk (03/19/2020)   Humiliation, Afraid, Rape, and Kick questionnaire    Fear of Current or Ex-Partner: No    Emotionally Abused: No    Physically Abused: No    Sexually Abused: No    FAMILY HISTORY:  Family History  Problem Relation Age of Onset   Aneurysm Daughter    Stroke Daughter    Diabetes Daughter    Hypertension Mother     Dementia Mother    Hypertension Father    Kidney cancer Brother    Cancer Brother        brain   Cancer Brother        bone   Stroke Brother    Hypertension Daughter    Hypertension Son  Hyperlipidemia Son    Colon cancer Neg Hx    Inflammatory bowel disease Neg Hx     CURRENT MEDICATIONS:  Outpatient Encounter Medications as of 05/09/2022  Medication Sig Note   acetaminophen (TYLENOL) 500 MG tablet Take 500 mg by mouth daily as needed for headache.    albuterol (VENTOLIN HFA) 108 (90 Base) MCG/ACT inhaler Inhale 2 puffs into the lungs every 6 (six) hours as needed for wheezing or shortness of breath.    amLODipine (NORVASC) 10 MG tablet Take 1 tablet (10 mg total) by mouth daily. (Patient taking differently: Take 10 mg by mouth daily.) 01/16/2022: Patient still on amlodipine -- last dose yesterday   Ascorbic Acid (VITAMIN C) 100 MG tablet Take 100 mg by mouth daily.    aspirin 81 MG tablet Take 81 mg by mouth once a week.     B Complex Vitamins (VITAMIN B COMPLEX) TABS See admin instructions.    Blood Glucose Calibration (TRUE METRIX LEVEL 1) Low SOLN     Cyanocobalamin (B-12 PO) Take 1 tablet by mouth daily.    Famotidine-Ca Carb-Mag Hydrox (PEPCID COMPLETE PO) Take 1 tablet by mouth as needed (indigestion).    ferrous sulfate 325 (65 FE) MG EC tablet Take 1 tablet (325 mg total) by mouth daily with breakfast.    hydrochlorothiazide (HYDRODIURIL) 25 MG tablet Take 25 mg by mouth daily.    lisinopril (PRINIVIL,ZESTRIL) 40 MG tablet Take 40 mg by mouth daily.    loratadine (CLARITIN) 10 MG tablet Take 1 tablet (10 mg total) by mouth daily.    Multiple Vitamins-Minerals (VITAMIN D3 COMPLETE) TABS See admin instructions.    nitroGLYCERIN (NITROSTAT) 0.4 MG SL tablet Place 1 tablet (0.4 mg total) under the tongue every 5 (five) minutes as needed for chest pain.    pantoprazole (PROTONIX) 40 MG tablet Take 1 tablet (40 mg total) by mouth daily. (Patient not taking: Reported on 05/01/2022)     pravastatin (PRAVACHOL) 20 MG tablet Take 20 mg by mouth at bedtime.    TRUE METRIX BLOOD GLUCOSE TEST test strip     VITAMIN D PO Take 1 tablet by mouth daily.    No facility-administered encounter medications on file as of 05/09/2022.    ALLERGIES:  Allergies  Allergen Reactions   Augmentin [Amoxicillin-Pot Clavulanate] Nausea And Vomiting    Hematemesis Has patient had a PCN reaction causing immediate rash, facial/tongue/throat swelling, SOB or lightheadedness with hypotension: Unknown Has patient had a PCN reaction causing severe rash involving mucus membranes or skin necrosis: Unknown Has patient had a PCN reaction that required hospitalization: Yes Has patient had a PCN reaction occurring within the last 10 years: Yes If all of the above answers are "NO", then may proceed with Cephalosporin use.    Amoxicillin Other (See Comments)    Other reaction(s): GI Bleeding   Clavulanic Acid Nausea And Vomiting   Penicillin G Nausea And Vomiting    .Did it involve swelling of the face/tongue/throat, SOB, or low BP? No Did it involve sudden or severe rash/hives, skin peeling, or any reaction on the inside of your mouth or nose? Unknown Did you need to seek medical attention at a hospital or doctor's office? yes When did it last happen? Unknown      If all above answers are "NO", may proceed with cephalosporin use.    Sulfa Antibiotics Swelling    LABORATORY DATA:  I have reviewed the labs as listed.  CBC    Component Value Date/Time  WBC 4.5 05/01/2022 0942   RBC 4.53 05/01/2022 0942   HGB 13.0 05/01/2022 0942   HCT 40.9 05/01/2022 0942   PLT 169 05/01/2022 0942   MCV 90.3 05/01/2022 0942   MCH 28.7 05/01/2022 0942   MCHC 31.8 05/01/2022 0942   RDW 13.9 05/01/2022 0942   LYMPHSABS 2.0 05/01/2022 0942   MONOABS 0.4 05/01/2022 0942   EOSABS 0.1 05/01/2022 0942   BASOSABS 0.0 05/01/2022 0942      Latest Ref Rng & Units 05/01/2022    9:42 AM 01/16/2022   10:19 AM  10/23/2021    1:06 PM  CMP  Glucose 70 - 99 mg/dL 120  138  109   BUN 8 - 23 mg/dL '19  18  22   '$ Creatinine 0.44 - 1.00 mg/dL 0.92  1.04  1.13   Sodium 135 - 145 mmol/L 137  139  140   Potassium 3.5 - 5.1 mmol/L 3.9  3.3  3.7   Chloride 98 - 111 mmol/L 97  100  104   CO2 22 - 32 mmol/L '29  30  27   '$ Calcium 8.9 - 10.3 mg/dL 9.4  9.8  9.3   Total Protein 6.5 - 8.1 g/dL 8.1   7.7   Total Bilirubin 0.3 - 1.2 mg/dL 0.8   0.4   Alkaline Phos 38 - 126 U/L 50   58   AST 15 - 41 U/L 21   21   ALT 0 - 44 U/L 18   20     DIAGNOSTIC IMAGING:  I have independently reviewed the relevant imaging and discussed with the patient.   WRAP UP:  All questions were answered. The patient knows to call the clinic with any problems, questions or concerns.  Medical decision making: Low  Time spent on visit: I spent 15 minutes counseling the patient face to face. The total time spent in the appointment was 22 minutes and more than 50% was on counseling.  Harriett Rush, PA-C  05/09/22 10:31 AM

## 2022-05-09 ENCOUNTER — Inpatient Hospital Stay (HOSPITAL_COMMUNITY): Payer: Medicare HMO | Attending: Physician Assistant | Admitting: Physician Assistant

## 2022-05-09 VITALS — BP 131/59 | HR 56 | Temp 98.3°F | Resp 17 | Ht 64.0 in | Wt 159.2 lb

## 2022-05-09 DIAGNOSIS — D472 Monoclonal gammopathy: Secondary | ICD-10-CM | POA: Diagnosis not present

## 2022-05-09 DIAGNOSIS — I10 Essential (primary) hypertension: Secondary | ICD-10-CM | POA: Insufficient documentation

## 2022-05-09 DIAGNOSIS — D500 Iron deficiency anemia secondary to blood loss (chronic): Secondary | ICD-10-CM | POA: Diagnosis not present

## 2022-05-09 DIAGNOSIS — Z8051 Family history of malignant neoplasm of kidney: Secondary | ICD-10-CM | POA: Insufficient documentation

## 2022-05-09 DIAGNOSIS — Z808 Family history of malignant neoplasm of other organs or systems: Secondary | ICD-10-CM | POA: Diagnosis not present

## 2022-05-09 DIAGNOSIS — E119 Type 2 diabetes mellitus without complications: Secondary | ICD-10-CM | POA: Insufficient documentation

## 2022-05-09 DIAGNOSIS — D509 Iron deficiency anemia, unspecified: Secondary | ICD-10-CM | POA: Insufficient documentation

## 2022-05-09 NOTE — Patient Instructions (Signed)
[place] at [facility] Discharge Instructions  You were seen today by Tarri Abernethy PA-C for the following conditions.  IRON DEFICIENCY: Your iron levels are good at this time.  You do not need any IV iron. - You should still continue taking iron pill (ferrous sulfate 325 mg) daily.  Take this in the morning with a small glass of orange juice.  This may cause some constipation, and you can use an over-the-counter stool softener to keep your bowel movements regular.  If your iron levels do not improve from taking the iron pill, we will give you IV iron at your next visit.  MGUS: As we discussed, this is an abnormal protein in your blood that can progress to a type of cancer known as multiple myeloma in about 1% of people.  At this time, your levels are stable.  You do not show any signs of progression to myeloma cancer at this time.  We will check your labs again in 6 months.  LABS: Return in 6 months for repeat labs + whole body x-rays  FOLLOW-UP APPOINTMENT: Office visit in 6 months, after labs/x-ray    - - - - - - - - - - - - - - - - - -    Thank you for choosing [place] at [facility] to provide your oncology and hematology care.  To afford each patient quality time with our provider, please arrive at least 15 minutes before your scheduled appointment time.   If you have a lab appointment with the [facility] please come in thru the Main Entrance and check in at the main information desk.  You need to re-schedule your appointment should you arrive 10 or more minutes late.  We strive to give you quality time with our providers, and arriving late affects you and other patients whose appointments are after yours.  Also, if you no show three or more times for appointments you may be dismissed from the clinic at the providers discretion.     Again, thank you for choosing [facility].  Our hope is that these requests will  decrease the amount of time that you wait before being seen by our physicians.       _____________________________________________________________  Should you have questions after your visit to [facility], please contact our office at [phone] and follow the prompts.  Our office hours are 8:00 a.m. and 4:30 p.m. Monday - Friday.  Please note that voicemails left after 4:00 p.m. may not be returned until the following business day.  We are closed weekends and major holidays.  You do have access to a nurse 24-7, just call the main number to the clinic [phone] and do not press any options, hold on the line and a nurse will answer the phone.    For prescription refill requests, have your pharmacy contact our office and allow 72 hours.    Due to Covid, you will need to wear a mask upon entering the hospital. If you do not have a mask, a mask will be given to you at the Main Entrance upon arrival. For doctor visits, patients may have 1 support person age 48 or older with them. For treatment visits, patients can not have anyone with them due to social distancing guidelines and our immunocompromised population.

## 2022-05-12 ENCOUNTER — Other Ambulatory Visit: Payer: Self-pay

## 2022-05-12 DIAGNOSIS — D472 Monoclonal gammopathy: Secondary | ICD-10-CM

## 2022-05-12 DIAGNOSIS — D500 Iron deficiency anemia secondary to blood loss (chronic): Secondary | ICD-10-CM

## 2022-05-23 ENCOUNTER — Encounter: Payer: Self-pay | Admitting: Internal Medicine

## 2022-05-23 ENCOUNTER — Ambulatory Visit: Payer: Medicare HMO | Attending: Internal Medicine | Admitting: Internal Medicine

## 2022-05-23 VITALS — BP 142/78 | HR 82 | Ht 64.0 in | Wt 164.0 lb

## 2022-05-23 DIAGNOSIS — Z136 Encounter for screening for cardiovascular disorders: Secondary | ICD-10-CM

## 2022-05-23 NOTE — Progress Notes (Signed)
Cardiology Office Note  Date: 05/23/2022   ID: Stacey Riley, DOB [birth year], MRN [id number]  PCP:  [facility]  Cardiologist:  Chalmers Guest, MD Electrophysiologist:  None   Reason for Office Visit: Evaluation chest pain at the request of PCP   History of Present Illness: Stacey Riley is a 82 y.o. female known to have HTN, DM 2, GERD, HLD, asymptomatic bradycardia, hiatal hernia was referred to cardiology clinic for evaluation of chest pressure.  Patient reported that she gets chest pressure at bedtime when she sleeps flat on the bed due to hiatal hernia. Otherwise, she is pretty much active in the community, works as a Psychologist, occupational at [employer] and denies any chest pressure at that time.  She also helps her 2 daughters who were ill with CVA and dialysis. She denies any DOE, lightness, dizziness, palpitations and leg swelling. Denies smoking cigarettes.  Patient underwent NM stress test in 2019 that showed no evidence of ischemia. There were horizontal ST segment depressions of 1 mm noted in the inferior leads and V5 and V6. There was also a brief Wenckebach in the recovery.  Past Medical History:  Diagnosis Date   Anemia 03/14/2016   Arthritis    Asthma    Bradycardia    DM (diabetes mellitus) (HCC)    GERD (gastroesophageal reflux disease)    High cholesterol    HTN (hypertension)     Past Surgical History:  Procedure Laterality Date   CHOLECYSTECTOMY  1990s   COLONOSCOPY  03/18/1999   Dr Rourk-int  hemorrhoids, pancolonic diverticula   COLONOSCOPY N/A 09/26/2016   Dr. Gala Romney: Diverticulosis, 4 mm polyp removed from the descending colon which is a tubular adenoma.  No future colonoscopies unless new symptoms arise.   COLONOSCOPY WITH ESOPHAGOGASTRODUODENOSCOPY (EGD)  08/06/2011   Rourk: Schatzki ring, moderate sized hiatal hernia status post dilation for history of dysphagia. Pancolonic diverticula, single diminutive polyp at the base of the  cecum removed (tubular adenoma). Next colonoscopy recommended for April 2018   ESOPHAGOGASTRODUODENOSCOPY  07/02/01   Dr Rourk-Schatzki's ring s/p 48F maloney dilation, otherwise normal/small hiatal hernia/   Linear erosion and ulceration in the proximal stomach/  The ulcerated lesion in the proximal stomach  benign.This may be a [name] lesion secondary to trauma to the mucosa, straddling the  diaphragmatic hiatus in the presence of a hiatal hernia    ESOPHAGOGASTRODUODENOSCOPY N/A 02/14/2016   Dr. Gala Romney: Large hiatal hernia, [name] lesion likely explains bleeding.   MALONEY DILATION  08/06/2011   Procedure: Venia Minks DILATION;  Surgeon: Daneil Dolin, MD;  Location: [facility];  Service: Endoscopy;  Laterality: N/A;   POLYPECTOMY  09/26/2016   Procedure: POLYPECTOMY;  Surgeon: Daneil Dolin, MD;  Location: [facility];  Service: Endoscopy;;  descending colon   TUBAL LIGATION      Current Outpatient Medications  Medication Sig Dispense Refill   acetaminophen (TYLENOL) 500 MG tablet Take 500 mg by mouth daily as needed for headache.     albuterol (VENTOLIN HFA) 108 (90 Base) MCG/ACT inhaler Inhale 2 puffs into the lungs every 6 (six) hours as needed for wheezing or shortness of breath.     amLODipine (NORVASC) 10 MG tablet Take 1 tablet (10 mg total) by mouth daily. (Patient taking differently: Take 10 mg by mouth daily.) 30 tablet 0   Ascorbic Acid (VITAMIN C) 100 MG tablet Take 100 mg by mouth daily.     aspirin 81 MG tablet Take  81 mg by mouth once a week.      B Complex Vitamins (VITAMIN B COMPLEX) TABS See admin instructions.     Blood Glucose Calibration (TRUE METRIX LEVEL 1) Low SOLN      Cyanocobalamin (B-12 PO) Take 1 tablet by mouth daily.     Famotidine-Ca Carb-Mag Hydrox (PEPCID COMPLETE PO) Take 1 tablet by mouth as needed (indigestion).     ferrous sulfate 325 (65 FE) MG EC tablet Take 1 tablet (325 mg total) by mouth daily with breakfast. 90 tablet 3   hydrochlorothiazide  (HYDRODIURIL) 25 MG tablet Take 25 mg by mouth daily.     lisinopril (PRINIVIL,ZESTRIL) 40 MG tablet Take 40 mg by mouth daily.     loratadine (CLARITIN) 10 MG tablet Take 1 tablet (10 mg total) by mouth daily. 30 tablet 0   Multiple Vitamins-Minerals (VITAMIN D3 COMPLETE) TABS See admin instructions.     nitroGLYCERIN (NITROSTAT) 0.4 MG SL tablet Place 1 tablet (0.4 mg total) under the tongue every 5 (five) minutes as needed for chest pain. 25 tablet 3   pravastatin (PRAVACHOL) 20 MG tablet Take 20 mg by mouth at bedtime.     TRUE METRIX BLOOD GLUCOSE TEST test strip      VITAMIN D PO Take 1 tablet by mouth daily.     No current facility-administered medications for this visit.   Allergies:  Augmentin [amoxicillin-pot clavulanate], Amoxicillin, Clavulanic acid, Penicillin g, and Sulfa antibiotics   Social History: The patient  reports that she has never smoked. She has never used smokeless tobacco. She reports that she does not drink alcohol and does not use drugs.   Family History: The patient's family history includes Aneurysm in her daughter; Cancer in her brother and brother; Dementia in her mother; Diabetes in her daughter; Hyperlipidemia in her son; Hypertension in her daughter, father, mother, and son; Kidney cancer in her brother; Stroke in her brother and daughter.   ROS:  Please see the history of present illness. Otherwise, complete review of systems is positive for none.  All other systems are reviewed and negative.   Physical Exam: VS:  BP (!) 142/78   Pulse 82   Ht 5' 4"$  (1.626 m)   Wt 164 lb (74.4 kg)   SpO2 97%   BMI 28.15 kg/m , BMI Body mass index is 28.15 kg/m.  Wt Readings from Last 3 Encounters:  05/23/22 164 lb (74.4 kg)  05/09/22 159 lb 3.2 oz (72.2 kg)  05/01/22 160 lb 3.2 oz (72.7 kg)    General: Patient appears comfortable at rest. HEENT: Conjunctiva and lids normal, oropharynx clear with moist mucosa. Neck: Supple, no elevated JVP or carotid bruits, no  thyromegaly. Lungs: Clear to auscultation, nonlabored breathing at rest. Cardiac: Regular rate and rhythm, no S3 or significant systolic murmur, no pericardial rub. Abdomen: Soft, nontender, no hepatomegaly, bowel sounds present, no guarding or rebound. Extremities: No pitting edema, distal pulses 2+. Skin: Warm and dry. Musculoskeletal: No kyphosis. Neuropsychiatric: Alert and oriented x3, affect grossly appropriate.  ECG:  An ECG dated 05/23/2022 was personally reviewed today and demonstrated:  NSR with no ST changes  Recent Labwork: 05/01/2022: ALT 18; AST 21; BUN 19; Creatinine, Ser 0.92; Hemoglobin 13.0; Platelets 169; Potassium 3.9; Sodium 137  No results found for: "CHOL", "TRIG", "HDL", "CHOLHDL", "VLDL", "LDLCALC", "LDLDIRECT"  Other Studies Reviewed Today: Stress test in 2019 Blood pressure demonstrated a hypertensive response to exercise. Horizontal ST segment depression ST segment depression of 1 mm was noted  during stress in the II, III, aVF, V5 and V6 leads, and returning to baseline after 1-5 minutes of recovery. 2nd degree type I AV block (Wenckebach) noted in recovery. Defect 1: There is a medium defect of moderate severity present in the mid anteroseptal, apical anterior, apical septal, apical inferior and apex location. This appears to be due to soft tissue attenuation. Regional wall motion is normal. Images better on stress than rest. This is a moderate risk study based on Duke treadmill score. However, myocardial perfusion is grossly normal with normal LV systolic function, thus putting this in the low to intermediate risk range. Nuclear stress EF: 57%.   Assessment and Plan: Patient is a 82 year old F known to have HTN, DM 2, GERD, hiatal hernia, HLD was referred to cardiology clinic for evaluation of chest pressure.  # Chest pressure likely noncardiac -Patient has chest pressure at rest before she goes to bed at night and denies any chest pressure on exertion. This  could be likely secondary to hiatal hernia.  # Screening of CAD -Obtain CT calcium scoring of coronaries  # HTN, controlled -Continue amlodipine 10 mg once daily, HCTZ 25 mg once daily, lisinopril 40 mg once daily -Management of HTN per PCP  # HLD -Continue pravastatin 20 mg nightly -Management of HLD per PCP  I have spent a total of 45 minutes with patient reviewing chart, EKGs, labs and examining patient as well as establishing an assessment and plan that was discussed with the patient.  > 50% of time was spent in direct patient care.     Medication Adjustments/Labs and Tests Ordered: Current medicines are reviewed at length with the patient today.  Concerns regarding medicines are outlined above.   Tests Ordered: Orders Placed This Encounter  Procedures   CT CARDIAC SCORING (SELF PAY ONLY)    Medication Changes: No orders of the defined types were placed in this encounter.   Disposition:  Follow up  1 year  Signed, Karem Tomaso Fidel Levy, MD, 05/23/2022 10:33 AM    [facility] [address] [place], [place] [address]

## 2022-05-23 NOTE — Patient Instructions (Signed)
Medication Instructions:  Your physician recommends that you continue on your current medications as directed. Please refer to the Current Medication list given to you today.   Labwork: None  Testing/Procedures: Calcium Score CT- $99 out of pocket  Follow-Up: Follow up with Dr. Dellia Cloud in 1 year.   Any Other Special Instructions Will Be Listed Below (If Applicable).     If you need a refill on your cardiac medications before your next appointment, please call your pharmacy.

## 2022-06-13 ENCOUNTER — Ambulatory Visit (HOSPITAL_COMMUNITY)
Admission: RE | Admit: 2022-06-13 | Discharge: 2022-06-13 | Disposition: A | Discharge status: Home / Self Care | Payer: Medicare HMO | Source: Ambulatory Visit | Attending: Internal Medicine | Admitting: Internal Medicine

## 2022-06-13 DIAGNOSIS — Z136 Encounter for screening for cardiovascular disorders: Secondary | ICD-10-CM | POA: Insufficient documentation

## 2022-08-29 ENCOUNTER — Other Ambulatory Visit (HOSPITAL_COMMUNITY): Payer: Self-pay | Admitting: Internal Medicine

## 2022-08-29 DIAGNOSIS — Z1231 Encounter for screening mammogram for malignant neoplasm of breast: Secondary | ICD-10-CM

## 2022-09-04 ENCOUNTER — Inpatient Hospital Stay
Admission: RE | Admit: 2022-09-04 | Discharge: 2022-09-04 | Disposition: A | Discharge status: Home / Self Care | Payer: Self-pay | Source: Ambulatory Visit | Attending: Internal Medicine | Admitting: Internal Medicine

## 2022-09-04 ENCOUNTER — Encounter (HOSPITAL_COMMUNITY): Payer: Self-pay

## 2022-09-04 ENCOUNTER — Ambulatory Visit (HOSPITAL_COMMUNITY)
Admission: RE | Admit: 2022-09-04 | Discharge: 2022-09-04 | Disposition: A | Discharge status: Home / Self Care | Payer: Medicare HMO | Source: Ambulatory Visit | Attending: Internal Medicine | Admitting: Internal Medicine

## 2022-09-04 ENCOUNTER — Other Ambulatory Visit (HOSPITAL_COMMUNITY): Payer: Self-pay | Admitting: Internal Medicine

## 2022-09-04 DIAGNOSIS — Z1231 Encounter for screening mammogram for malignant neoplasm of breast: Secondary | ICD-10-CM | POA: Diagnosis present

## 2022-10-31 ENCOUNTER — Ambulatory Visit (HOSPITAL_COMMUNITY)
Admission: RE | Admit: 2022-10-31 | Discharge: 2022-10-31 | Disposition: A | Discharge status: Home / Self Care | Payer: Medicare HMO | Source: Ambulatory Visit | Attending: Physician Assistant | Admitting: Physician Assistant

## 2022-10-31 ENCOUNTER — Inpatient Hospital Stay: Payer: Medicare HMO | Attending: Hematology

## 2022-10-31 DIAGNOSIS — Z808 Family history of malignant neoplasm of other organs or systems: Secondary | ICD-10-CM | POA: Diagnosis not present

## 2022-10-31 DIAGNOSIS — D472 Monoclonal gammopathy: Secondary | ICD-10-CM | POA: Insufficient documentation

## 2022-10-31 DIAGNOSIS — D509 Iron deficiency anemia, unspecified: Secondary | ICD-10-CM | POA: Diagnosis not present

## 2022-10-31 DIAGNOSIS — Z8051 Family history of malignant neoplasm of kidney: Secondary | ICD-10-CM | POA: Diagnosis not present

## 2022-10-31 DIAGNOSIS — D500 Iron deficiency anemia secondary to blood loss (chronic): Secondary | ICD-10-CM

## 2022-10-31 DIAGNOSIS — M25872 Other specified joint disorders, left ankle and foot: Secondary | ICD-10-CM | POA: Insufficient documentation

## 2022-10-31 LAB — CBC WITH DIFFERENTIAL/PLATELET
Abs Immature Granulocytes: 0.01 10*3/uL (ref 0.00–0.07)
Basophils Absolute: 0 10*3/uL (ref 0.0–0.1)
Basophils Relative: 1 %
Eosinophils Absolute: 0.1 10*3/uL (ref 0.0–0.5)
Eosinophils Relative: 1 %
HCT: 43.8 % (ref 36.0–46.0)
Hemoglobin: 14 g/dL (ref 12.0–15.0)
Immature Granulocytes: 0 %
Lymphocytes Relative: 46 %
Lymphs Abs: 2.2 10*3/uL (ref 0.7–4.0)
MCH: 29 pg (ref 26.0–34.0)
MCHC: 32 g/dL (ref 30.0–36.0)
MCV: 90.7 fL (ref 80.0–100.0)
Monocytes Absolute: 0.4 10*3/uL (ref 0.1–1.0)
Monocytes Relative: 8 %
Neutro Abs: 2.1 10*3/uL (ref 1.7–7.7)
Neutrophils Relative %: 44 %
Platelets: 185 10*3/uL (ref 150–400)
RBC: 4.83 MIL/uL (ref 3.87–5.11)
RDW: 13.9 % (ref 11.5–15.5)
WBC: 4.9 10*3/uL (ref 4.0–10.5)
nRBC: 0 % (ref 0.0–0.2)

## 2022-10-31 LAB — IRON AND TIBC
Iron: 72 ug/dL (ref 28–170)
Saturation Ratios: 20 % (ref 10.4–31.8)
TIBC: 354 ug/dL (ref 250–450)
UIBC: 282 ug/dL

## 2022-10-31 LAB — COMPREHENSIVE METABOLIC PANEL
ALT: 19 U/L (ref 0–44)
AST: 21 U/L (ref 15–41)
Albumin: 4.1 g/dL (ref 3.5–5.0)
Alkaline Phosphatase: 50 U/L (ref 38–126)
Anion gap: 8 (ref 5–15)
BUN: 19 mg/dL (ref 8–23)
CO2: 28 mmol/L (ref 22–32)
Calcium: 9.4 mg/dL (ref 8.9–10.3)
Chloride: 103 mmol/L (ref 98–111)
Creatinine, Ser: 0.96 mg/dL (ref 0.44–1.00)
GFR, Estimated: 59 mL/min — ABNORMAL LOW (ref >60)
Glucose, Bld: 111 mg/dL — ABNORMAL HIGH (ref 70–99)
Potassium: 3.3 mmol/L — ABNORMAL LOW (ref 3.5–5.1)
Sodium: 139 mmol/L (ref 135–145)
Total Bilirubin: 0.3 mg/dL (ref 0.3–1.2)
Total Protein: 8.1 g/dL (ref 6.5–8.1)

## 2022-10-31 LAB — FERRITIN: Ferritin: 55 ng/mL (ref 11–307)

## 2022-10-31 LAB — LACTATE DEHYDROGENASE: LDH: 128 U/L (ref 98–192)

## 2022-11-03 LAB — PROTEIN ELECTROPHORESIS, SERUM
A/G Ratio: 1.1 (ref 0.7–1.7)
Albumin ELP: 4.1 g/dL (ref 2.9–4.4)
Alpha-1-Globulin: 0.2 g/dL (ref 0.0–0.4)
Alpha-2-Globulin: 0.8 g/dL (ref 0.4–1.0)
Beta Globulin: 0.9 g/dL (ref 0.7–1.3)
Gamma Globulin: 1.6 g/dL (ref 0.4–1.8)
Globulin, Total: 3.6 g/dL (ref 2.2–3.9)
M-Spike, %: 0.9 g/dL — ABNORMAL HIGH
Total Protein ELP: 7.7 g/dL (ref 6.0–8.5)

## 2022-11-03 LAB — KAPPA/LAMBDA LIGHT CHAINS
Kappa free light chain: 23.1 mg/L — ABNORMAL HIGH (ref 3.3–19.4)
Kappa, lambda light chain ratio: 1.11 (ref 0.26–1.65)
Lambda free light chains: 20.9 mg/L (ref 5.7–26.3)

## 2022-11-05 NOTE — Progress Notes (Addendum)
[facility] [address], [place] [address]   CLINIC:  Medical Oncology/Hematology  PCP:  [facility] [address] [place] [address] [phone]   REASON FOR VISIT:  Follow-up for MGUS and iron deficiency anemia   PRIOR THERAPY: Intermittent parenteral iron therapy   CURRENT THERAPY: Surveillance, iron pill  INTERVAL HISTORY:   Stacey Riley 82 y.o. female returns for routine follow-up of her MGUS and iron deficiency anemia.  She was last seen in office by Rojelio Brenner PA-C on 05/09/2022.  At today's visit, she reports feeling fairly well.  She denies any recent hospitalizations, surgeries, or changes in baseline health status.  She denies any recent bleeding such as epistaxis, hematemesis, hematochezia, or melena.   She reports that she has intermittent fatigue that depends on the day, with some days where she feels "real tired;" she notes decreased stamina and states that she tires quickly after her activities. She has occasional headaches.  She does not have any lightheadedness or syncope.  She does not have any complaints of pica, restless legs, chest pain, or dyspnea on exertion.  She is taking iron tablet daily without any significant side effects.     She has ongoing pain in her left knee, but denies any new bone pain or recent fractures.  She reports occasional hot flashes and night sweats.  She denies any unexplained fever, chills, or weight loss.  No new neurologic symptoms such as tinnitus, new-onset hearing loss, blurred vision, headache, or dizziness.   She has some chronic and intermittent numbness / tingling in hands or feet.   No thromboembolic events since her last visit.  No new masses or lymphadenopathy per her report.  She has 100% energy and 100% appetite. She endorses that she is maintaining a stable weight.  ASSESSMENT & PLAN:  1.  IgG lambda MGUS: - Diagnosed in 2018.  Levels have been relatively stable  since that time, with a 1% chance per year of progression to multiple myeloma. - Immunofixation confirms IgG monoclonal protein with lambda light chain specificity - Skeletal survey from 10/25/2021 was negative for lytic or sclerotic lesions - Most recent skeletal survey obtained 10/31/2022 - no major abnormalities per my review, but with radiology read pending as of 11/07/2022  - Most recent myeloma panel (10/31/2022): M spike stable at 0.9; normal FLC ratio 1.11, mildly elevated kappa light chain 23.1, normal lambda 20.9.  LDH normal. - Current labs negative for CRAB features: Hgb 14.0, creatinine 0.96, calcium 9.4 - No new bone pain or B symptoms. - PLAN: RTC in 6 months for repeat myeloma/MGUS panel.  We will check 24-hour urine/UPEP, as this has not yet been done.  Next skeletal survey due July 2025.   2.  Iron deficiency anemia: - Thought to be secondary to chronic GI blood loss - EGD (November 2017): Cameron lesions in the stomach, considered to be possible source of bleeding - Colonoscopy (09/26/2016): 4 mm polyp with pathology showing tubular adenoma - She is required intermittent IV iron, most recently with IV Venofer 300 mg x 3 in July/August 2023 - No epistaxis, hematemesis, hematochezia, or melena. - She has been taking ferrous sulfate daily since January 2023, which she is tolerating well  - Most recent labs (10/31/2022): Hgb 14.0, ferritin 55, iron saturation 20% - PLAN: No IV iron at this time.   - Recommend patient that she continue iron pill daily. - Repeat CBC/iron panel and RTC in 6 months.  3.  Left ankle lesion - Onset of "new mole" to left lateral ankle 2 to 3 months ago - Lesion has enlarged, changed color, and become pruritic in the short time since it appeared - On exam, lesion is multicolored (grayish base with purpleish black raised area), irregular border, asymmetric, and approximate 1.0 cm diameter (pictured below) - PLAN: Lesion is suspicious for either cancerous or  precancerous cells.  We will refer urgently to dermatology for further evaluation.  Patient aware and agreeable.  PLAN SUMMARY:  >> 24-hour urine with UPEP >> Labs in 6 months (CBC/D, CMP, LDH, iron/TIBC, ferritin, SPEP, light chains) >> OFFICE visit in 6 months (1 week after labs) >> URGENT referral to dermatology for suspicious spot on left ankle     REVIEW OF SYSTEMS:   Review of Systems  Constitutional:  Negative for appetite change, chills, diaphoresis, fatigue, fever and unexpected weight change.  HENT:   Negative for lump/mass and nosebleeds.   Eyes:  Negative for eye problems.  Respiratory:  Negative for cough, hemoptysis and shortness of breath.   Cardiovascular:  Negative for chest pain, leg swelling and palpitations.  Gastrointestinal:  Negative for abdominal pain, blood in stool, constipation, diarrhea, nausea and vomiting.  Genitourinary:  Negative for dysuria, frequency and hematuria.   Musculoskeletal:  Positive for arthralgias (left knee arthritis).  Skin:  Positive for itching (left leg skin lesion).  Neurological:  Positive for numbness. Negative for dizziness, headaches (occasional) and light-headedness.  Hematological:  Does not bruise/bleed easily.  Psychiatric/Behavioral:  Positive for sleep disturbance.      PHYSICAL EXAM:  ECOG PERFORMANCE STATUS: 0 - Asymptomatic  There were no vitals filed for this visit. There were no vitals filed for this visit. Physical Exam Constitutional:      Appearance: Normal appearance. She is obese.  HENT:     Head: Normocephalic and atraumatic.     Mouth/Throat:     Mouth: Mucous membranes are moist.  Eyes:     Extraocular Movements: Extraocular movements intact.     Pupils: Pupils are equal, round, and reactive to light.  Cardiovascular:     Rate and Rhythm: Regular rhythm. Bradycardia present.     Pulses: Normal pulses.     Heart sounds: Normal heart sounds.  Pulmonary:     Effort: Pulmonary effort is normal.      Breath sounds: Normal breath sounds.     Comments: Diminished bases. Abdominal:     General: Bowel sounds are normal.     Palpations: Abdomen is soft.     Tenderness: There is no abdominal tenderness.  Musculoskeletal:        General: No swelling.     Right lower leg: No edema.     Left lower leg: No edema.  Lymphadenopathy:     Cervical: No cervical adenopathy.  Skin:    General: Skin is warm and dry.     Findings: Lesion (Left lateral ankle lesion,  multicolored (grayish base with purpleish black raised area), irregular border, asymmetric, and approximate 1.0 cm diameter (pictured below)) present.  Neurological:     General: No focal deficit present.     Mental Status: She is alert and oriented to person, place, and time.  Psychiatric:        Mood and Affect: Mood normal.        Behavior: Behavior normal.     PAST MEDICAL/SURGICAL HISTORY:  Past Medical History:  Diagnosis Date   Anemia 03/14/2016   Arthritis  Asthma    Bradycardia    DM (diabetes mellitus) (HCC)    GERD (gastroesophageal reflux disease)    High cholesterol    HTN (hypertension)    Past Surgical History:  Procedure Laterality Date   CHOLECYSTECTOMY  1990s   COLONOSCOPY  03/18/1999   Dr Randall Hiss  hemorrhoids, pancolonic diverticula   COLONOSCOPY N/A 09/26/2016   Dr. Jena Gauss: Diverticulosis, 4 mm polyp removed from the descending colon which is a tubular adenoma.  No future colonoscopies unless new symptoms arise.   COLONOSCOPY WITH ESOPHAGOGASTRODUODENOSCOPY (EGD)  08/06/2011   Rourk: Schatzki ring, moderate sized hiatal hernia status post dilation for history of dysphagia. Pancolonic diverticula, single diminutive polyp at the base of the cecum removed (tubular adenoma). Next colonoscopy recommended for April 2018   ESOPHAGOGASTRODUODENOSCOPY  07/02/01   Dr Rourk-Schatzki's ring s/p 63F maloney dilation, otherwise normal/small hiatal hernia/   Linear erosion and ulceration in the proximal stomach/  The  ulcerated lesion in the proximal stomach  benign.This may be a [name] lesion secondary to trauma to the mucosa, straddling the  diaphragmatic hiatus in the presence of a hiatal hernia    ESOPHAGOGASTRODUODENOSCOPY N/A 02/14/2016   Dr. Jena Gauss: Large hiatal hernia, [name] lesion likely explains bleeding.   MALONEY DILATION  08/06/2011   Procedure: [name] DILATION;  Surgeon: [name], MD;  Location: [facility];  Service: Endoscopy;  Laterality: N/A;   POLYPECTOMY  09/26/2016   Procedure: POLYPECTOMY;  Surgeon: [name], MD;  Location: [facility];  Service: Endoscopy;;  descending colon   TUBAL LIGATION      SOCIAL HISTORY:  Social History   Socioeconomic History   Marital status: Divorced    Spouse name: Not on file   Number of children: 5   Years of education: Not on file   Highest education level: Not on file  Occupational History   Occupation: retired; Designer, fashion/clothing  Tobacco Use   Smoking status: Never   Smokeless tobacco: Never  Vaping Use   Vaping status: Never Used  Substance and Sexual Activity   Alcohol use: No   Drug use: No   Sexual activity: Not on file  Other Topics Concern   Not on file  Social History Narrative   Lives alone   Social Determinants of Health   Financial Resource Strain: Low Risk  (03/19/2020)   Overall Financial Resource Strain (CARDIA)    Difficulty of Paying Living Expenses: Not hard at all  Food Insecurity: No Food Insecurity (03/19/2020)   Hunger Vital Sign    Worried About Running Out of Food in the Last Year: Never true    Ran Out of Food in the Last Year: Never true  Transportation Needs: No Transportation Needs (03/19/2020)   PRAPARE - Administrator, Civil Service (Medical): No    Lack of Transportation (Non-Medical): No  Physical Activity: Inactive (03/19/2020)   Exercise Vital Sign    Days of Exercise per Week: 0 days    Minutes of Exercise per Session: 0 min  Stress: No Stress Concern Present (03/19/2020)    [employer] of Occupational Health - Occupational Stress Questionnaire    Feeling of Stress : Not at all  Social Connections: Unknown (08/25/2021)   Received from [facility]   Social Network    Social Network: Not on file  Intimate Partner Violence: Unknown (07/17/2021)   Received from [facility]   HITS    Physically Hurt: Not on file    Insult or  Talk Down To: Not on file    Threaten Physical Harm: Not on file    Scream or Curse: Not on file    FAMILY HISTORY:  Family History  Problem Relation Age of Onset   Aneurysm Daughter    Stroke Daughter    Diabetes Daughter    Hypertension Mother    Dementia Mother    Hypertension Father    Kidney cancer Brother    Cancer Brother        brain   Cancer Brother        bone   Stroke Brother    Hypertension Daughter    Hypertension Son    Hyperlipidemia Son    Colon cancer Neg Hx    Inflammatory bowel disease Neg Hx     CURRENT MEDICATIONS:  Outpatient Encounter Medications as of 11/07/2022  Medication Sig Note   acetaminophen (TYLENOL) 500 MG tablet Take 500 mg by mouth daily as needed for headache.    albuterol (VENTOLIN HFA) 108 (90 Base) MCG/ACT inhaler Inhale 2 puffs into the lungs every 6 (six) hours as needed for wheezing or shortness of breath.    amLODipine (NORVASC) 10 MG tablet Take 1 tablet (10 mg total) by mouth daily. (Patient taking differently: Take 10 mg by mouth daily.) 01/16/2022: Patient still on amlodipine -- last dose yesterday   Ascorbic Acid (VITAMIN C) 100 MG tablet Take 100 mg by mouth daily.    aspirin 81 MG tablet Take 81 mg by mouth once a week.     B Complex Vitamins (VITAMIN B COMPLEX) TABS See admin instructions.    Blood Glucose Calibration (TRUE METRIX LEVEL 1) Low SOLN     Cyanocobalamin (B-12 PO) Take 1 tablet by mouth daily.    Famotidine-Ca Carb-Mag Hydrox (PEPCID COMPLETE PO) Take 1 tablet by mouth as needed (indigestion).    ferrous sulfate 325 (65 FE) MG EC tablet Take 1 tablet (325  mg total) by mouth daily with breakfast.    hydrochlorothiazide (HYDRODIURIL) 25 MG tablet Take 25 mg by mouth daily.    lisinopril (PRINIVIL,ZESTRIL) 40 MG tablet Take 40 mg by mouth daily.    loratadine (CLARITIN) 10 MG tablet Take 1 tablet (10 mg total) by mouth daily.    Multiple Vitamins-Minerals (VITAMIN D3 COMPLETE) TABS See admin instructions.    nitroGLYCERIN (NITROSTAT) 0.4 MG SL tablet Place 1 tablet (0.4 mg total) under the tongue every 5 (five) minutes as needed for chest pain.    pravastatin (PRAVACHOL) 20 MG tablet Take 20 mg by mouth at bedtime.    TRUE METRIX BLOOD GLUCOSE TEST test strip     VITAMIN D PO Take 1 tablet by mouth daily.    No facility-administered encounter medications on file as of 11/07/2022.    ALLERGIES:  Allergies  Allergen Reactions   Augmentin [Amoxicillin-Pot Clavulanate] Nausea And Vomiting    Hematemesis Has patient had a PCN reaction causing immediate rash, facial/tongue/throat swelling, SOB or lightheadedness with hypotension: Unknown Has patient had a PCN reaction causing severe rash involving mucus membranes or skin necrosis: Unknown Has patient had a PCN reaction that required hospitalization: Yes Has patient had a PCN reaction occurring within the last 10 years: Yes If all of the above answers are "NO", then may proceed with Cephalosporin use.    Amoxicillin Other (See Comments)    Other reaction(s): GI Bleeding   Clavulanic Acid Nausea And Vomiting   Penicillin G Nausea And Vomiting    .Did it involve swelling of  the face/tongue/throat, SOB, or low BP? No Did it involve sudden or severe rash/hives, skin peeling, or any reaction on the inside of your mouth or nose? Unknown Did you need to seek medical attention at a hospital or doctor's office? yes When did it last happen? Unknown      If all above answers are "NO", may proceed with cephalosporin use.    Sulfa Antibiotics Swelling    LABORATORY DATA:  I have reviewed the labs as  listed.  CBC    Component Value Date/Time   WBC 4.9 10/31/2022 0946   RBC 4.83 10/31/2022 0946   HGB 14.0 10/31/2022 0946   HCT 43.8 10/31/2022 0946   PLT 185 10/31/2022 0946   MCV 90.7 10/31/2022 0946   MCH 29.0 10/31/2022 0946   MCHC 32.0 10/31/2022 0946   RDW 13.9 10/31/2022 0946   LYMPHSABS 2.2 10/31/2022 0946   MONOABS 0.4 10/31/2022 0946   EOSABS 0.1 10/31/2022 0946   BASOSABS 0.0 10/31/2022 0946      Latest Ref Rng & Units 10/31/2022    9:46 AM 05/01/2022    9:42 AM 01/16/2022   10:19 AM  CMP  Glucose 70 - 99 mg/dL 272  536  644   BUN 8 - 23 mg/dL 19  19  18    Creatinine 0.44 - 1.00 mg/dL 0.34  7.42  5.95   Sodium 135 - 145 mmol/L 139  137  139   Potassium 3.5 - 5.1 mmol/L 3.3  3.9  3.3   Chloride 98 - 111 mmol/L 103  97  100   CO2 22 - 32 mmol/L 28  29  30    Calcium 8.9 - 10.3 mg/dL 9.4  9.4  9.8   Total Protein 6.5 - 8.1 g/dL 8.1  8.1    Total Bilirubin 0.3 - 1.2 mg/dL 0.3  0.8    Alkaline Phos 38 - 126 U/L 50  50    AST 15 - 41 U/L 21  21    ALT 0 - 44 U/L 19  18      DIAGNOSTIC IMAGING:  I have independently reviewed the relevant imaging and discussed with the patient.   WRAP UP:  All questions were answered. The patient knows to call the clinic with any problems, questions or concerns.  Medical decision making: Low  Time spent on visit: I spent 15 minutes counseling the patient face to face. The total time spent in the appointment was 22 minutes and more than 50% was on counseling.  Carnella Guadalajara, PA-C  11/07/22 11:14 AM

## 2022-11-07 ENCOUNTER — Inpatient Hospital Stay: Payer: Medicare HMO | Admitting: Physician Assistant

## 2022-11-07 VITALS — BP 147/48 | HR 56 | Temp 98.6°F | Resp 16 | Wt 166.9 lb

## 2022-11-07 DIAGNOSIS — D500 Iron deficiency anemia secondary to blood loss (chronic): Secondary | ICD-10-CM

## 2022-11-07 DIAGNOSIS — D472 Monoclonal gammopathy: Secondary | ICD-10-CM

## 2022-11-07 DIAGNOSIS — D2272 Melanocytic nevi of left lower limb, including hip: Secondary | ICD-10-CM

## 2022-11-07 NOTE — Patient Instructions (Addendum)
[facility] at [facility] Discharge Instructions  You were seen today by Rojelio Brenner PA-C for the following conditions.  IRON DEFICIENCY: Your iron levels are good at this time.  You do not need any IV iron. - You should still continue taking iron pill (ferrous sulfate 325 mg) daily.  Take this in the morning with a small glass of orange juice.  This may cause some constipation, and you can use an over-the-counter stool softener to keep your bowel movements regular.  If your iron levels do not improve from taking the iron pill, we will give you IV iron at your next visit.  MGUS: As we discussed, this is an abnormal protein in your blood that can progress to a type of cancer known as multiple myeloma in about 1% of people.  At this time, your levels are stable.  You do not show any signs of progression to myeloma cancer at this time.  We will check your labs again in 6 months.  We will also check a 24-hour urine study.  Kit has been provided for you. FIRST MORNING: Discard first urine of the morning into the toilet. Collect the rest of your urine in the orange jug for the next 24 hours. SECOND MORNING: Collect your first urine of the morning in the jug.  This ends your 24-hour urine collection. **Store the urine jug in the refrigerator but is not being used. Return urine jug to fourth floor Cancer Center as soon as it is completed.   We will refer you to dermatology to look at the spot on your left ankle.  LABS: Return in 6 months for repeat labs   FOLLOW-UP APPOINTMENT: Office visit in 6 months    - - - - - - - - - - - - - - - - - -    Thank you for choosing [facility] at [facility] to provide your oncology and hematology care.  To afford each patient quality time with our provider, please arrive at least 15 minutes before your scheduled appointment time.   If you have a lab appointment with the Cancer Center please come in thru the  Main Entrance and check in at the main information desk.  You need to re-schedule your appointment should you arrive 10 or more minutes late.  We strive to give you quality time with our providers, and arriving late affects you and other patients whose appointments are after yours.  Also, if you no show three or more times for appointments you may be dismissed from the clinic at the providers discretion.     Again, thank you for choosing [facility].  Our hope is that these requests will decrease the amount of time that you wait before being seen by our physicians.       _____________________________________________________________  Should you have questions after your visit to [facility], please contact our office at [phone] and follow the prompts.  Our office hours are 8:00 a.m. and 4:30 p.m. Monday - Friday.  Please note that voicemails left after 4:00 p.m. may not be returned until the following business day.  We are closed weekends and major holidays.  You do have access to a nurse 24-7, just call the main number to the clinic [phone] and do not press any options, hold on the line and a nurse will answer the phone.    For prescription refill requests, have your pharmacy  contact our office and allow 72 hours.    Due to Covid, you will need to wear a mask upon entering the hospital. If you do not have a mask, a mask will be given to you at the Main Entrance upon arrival. For doctor visits, patients may have 1 support person age 52 or older with them. For treatment visits, patients can not have anyone with them due to social distancing guidelines and our immunocompromised population.

## 2022-11-11 ENCOUNTER — Other Ambulatory Visit (HOSPITAL_COMMUNITY): Payer: Self-pay

## 2022-11-11 DIAGNOSIS — D500 Iron deficiency anemia secondary to blood loss (chronic): Secondary | ICD-10-CM

## 2022-11-11 DIAGNOSIS — D472 Monoclonal gammopathy: Secondary | ICD-10-CM

## 2022-11-12 ENCOUNTER — Telehealth: Payer: Self-pay | Admitting: Physician Assistant

## 2022-11-12 DIAGNOSIS — R936 Abnormal findings on diagnostic imaging of limbs: Secondary | ICD-10-CM

## 2022-11-12 DIAGNOSIS — D472 Monoclonal gammopathy: Secondary | ICD-10-CM

## 2022-11-12 NOTE — Telephone Encounter (Signed)
Attempted to contact patient to discuss results of skeletal survey.  Patient had 2 nonspecific lucencies in her left mid femoral shaft.  Per my discussion with Dr. Ellin Saba, no additional workup or bone marrow biopsy is needed at this time, but we will check repeat femur x-ray in 6 months, at the same time as her next MGUS/myeloma labs.  We will try again to reach her within the next few days to discuss the above.  Rojelio Brenner PA-C 11/12/2022 4:32 PM

## 2022-12-17 ENCOUNTER — Encounter: Payer: Self-pay | Admitting: Physician Assistant

## 2022-12-17 NOTE — Progress Notes (Signed)
Patient seen by dermatology Catering manager Register PA-C at Dr. Scharlene Gloss office) on 11/26/2022.  I have reviewed records sent by dermatology office.  Suspicious lesion on left ankle diagnosed as "irritated seborrheic keratosis," with instructions to return to dermatology clinic as needed.  Rojelio Brenner PA-C 12/17/2022 8:59 AM

## 2023-02-12 ENCOUNTER — Other Ambulatory Visit: Payer: Self-pay

## 2023-02-12 ENCOUNTER — Emergency Department (HOSPITAL_COMMUNITY): Payer: Medicare HMO

## 2023-02-12 ENCOUNTER — Encounter (HOSPITAL_COMMUNITY): Payer: Self-pay | Admitting: *Deleted

## 2023-02-12 ENCOUNTER — Emergency Department (HOSPITAL_COMMUNITY)
Admission: EM | Admit: 2023-02-12 | Discharge: 2023-02-12 | Disposition: A | Discharge status: Home / Self Care | Payer: Medicare HMO | Attending: Emergency Medicine | Admitting: Emergency Medicine

## 2023-02-12 DIAGNOSIS — E119 Type 2 diabetes mellitus without complications: Secondary | ICD-10-CM | POA: Diagnosis not present

## 2023-02-12 DIAGNOSIS — Z7982 Long term (current) use of aspirin: Secondary | ICD-10-CM | POA: Diagnosis not present

## 2023-02-12 DIAGNOSIS — J45909 Unspecified asthma, uncomplicated: Secondary | ICD-10-CM | POA: Insufficient documentation

## 2023-02-12 DIAGNOSIS — Z79899 Other long term (current) drug therapy: Secondary | ICD-10-CM | POA: Insufficient documentation

## 2023-02-12 DIAGNOSIS — I10 Essential (primary) hypertension: Secondary | ICD-10-CM | POA: Insufficient documentation

## 2023-02-12 DIAGNOSIS — Z20822 Contact with and (suspected) exposure to covid-19: Secondary | ICD-10-CM | POA: Insufficient documentation

## 2023-02-12 DIAGNOSIS — N179 Acute kidney failure, unspecified: Secondary | ICD-10-CM | POA: Diagnosis not present

## 2023-02-12 DIAGNOSIS — J40 Bronchitis, not specified as acute or chronic: Secondary | ICD-10-CM | POA: Diagnosis not present

## 2023-02-12 DIAGNOSIS — R051 Acute cough: Secondary | ICD-10-CM | POA: Diagnosis not present

## 2023-02-12 DIAGNOSIS — R079 Chest pain, unspecified: Secondary | ICD-10-CM | POA: Diagnosis present

## 2023-02-12 DIAGNOSIS — I498 Other specified cardiac arrhythmias: Secondary | ICD-10-CM

## 2023-02-12 LAB — CBC WITH DIFFERENTIAL/PLATELET
Abs Immature Granulocytes: 0.03 10*3/uL (ref 0.00–0.07)
Basophils Absolute: 0 10*3/uL (ref 0.0–0.1)
Basophils Relative: 0 %
Eosinophils Absolute: 0.1 10*3/uL (ref 0.0–0.5)
Eosinophils Relative: 1 %
HCT: 41.8 % (ref 36.0–46.0)
Hemoglobin: 13.4 g/dL (ref 12.0–15.0)
Immature Granulocytes: 0 %
Lymphocytes Relative: 32 %
Lymphs Abs: 2.5 10*3/uL (ref 0.7–4.0)
MCH: 28.8 pg (ref 26.0–34.0)
MCHC: 32.1 g/dL (ref 30.0–36.0)
MCV: 89.7 fL (ref 80.0–100.0)
Monocytes Absolute: 0.5 10*3/uL (ref 0.1–1.0)
Monocytes Relative: 6 %
Neutro Abs: 4.7 10*3/uL (ref 1.7–7.7)
Neutrophils Relative %: 61 %
Platelets: 267 10*3/uL (ref 150–400)
RBC: 4.66 MIL/uL (ref 3.87–5.11)
RDW: 14 % (ref 11.5–15.5)
WBC: 7.8 10*3/uL (ref 4.0–10.5)
nRBC: 0 % (ref 0.0–0.2)

## 2023-02-12 LAB — BASIC METABOLIC PANEL
Anion gap: 13 (ref 5–15)
BUN: 26 mg/dL — ABNORMAL HIGH (ref 8–23)
CO2: 27 mmol/L (ref 22–32)
Calcium: 9.9 mg/dL (ref 8.9–10.3)
Chloride: 96 mmol/L — ABNORMAL LOW (ref 98–111)
Creatinine, Ser: 1.38 mg/dL — ABNORMAL HIGH (ref 0.44–1.00)
GFR, Estimated: 38 mL/min — ABNORMAL LOW (ref >60)
Glucose, Bld: 134 mg/dL — ABNORMAL HIGH (ref 70–99)
Potassium: 3.3 mmol/L — ABNORMAL LOW (ref 3.5–5.1)
Sodium: 136 mmol/L (ref 135–145)

## 2023-02-12 LAB — RESP PANEL BY RT-PCR (RSV, FLU A&B, COVID)  RVPGX2
Influenza A by PCR: NEGATIVE
Influenza B by PCR: NEGATIVE
Resp Syncytial Virus by PCR: NEGATIVE
SARS Coronavirus 2 by RT PCR: NEGATIVE

## 2023-02-12 MED ORDER — IPRATROPIUM-ALBUTEROL 0.5-2.5 (3) MG/3ML IN SOLN
3.0000 mL | Freq: Once | RESPIRATORY_TRACT | Status: AC
  Administered 2023-02-12: 3 mL via RESPIRATORY_TRACT
  Filled 2023-02-12: qty 3

## 2023-02-12 NOTE — ED Provider Notes (Signed)
[facility] AT [facility] Provider Note   CSN: [id number] Arrival date & time: 02/12/23  0725     History  Chief Complaint  Patient presents with   Chest Pain   Cough   Back Pain    Stacey Riley is a 82 y.o. female.  Patient is a 82 yo female with pmh of gerd, asthma, hyperlipidemia, hypertension, and diabetes presenting for cough.  Patient admits to cough that has been persistent over the past 3 days.  She went to urgent care and got doxycycline and prednisone.  She is also taking albuterol for history of asthma.  She states she has significantly improved her systems but still has a residual cough.  Admits to some rib pain anteriorly and posteriorly when coughing.  States all pain is resolved at this time. Fevers, chills, nausea, vomiting, and lethargy have resolved at this time.  The history is provided by the patient. No language interpreter was used.  Chest Pain Associated symptoms: back pain and cough   Associated symptoms: no abdominal pain, no fever, no palpitations, no shortness of breath and no vomiting   Cough Associated symptoms: no chills, no ear pain, no fever, no rash, no shortness of breath, no sore throat and no wheezing   Back Pain Associated symptoms: no abdominal pain, no dysuria and no fever        Home Medications Prior to Admission medications   Medication Sig Start Date End Date Taking? Authorizing Provider  doxycycline (VIBRAMYCIN) 100 MG capsule Take 100 mg by mouth 2 (two) times daily. 02/02/23  Yes [provider]  acetaminophen (TYLENOL) 500 MG tablet Take 500 mg by mouth daily as needed for headache.    [provider]  albuterol (VENTOLIN HFA) 108 (90 Base) MCG/ACT inhaler Inhale 2 puffs into the lungs every 6 (six) hours as needed for wheezing or shortness of breath.    [provider]  Alcohol Swabs (DROPSAFE ALCOHOL PREP) 70 % PADS Apply topically. 10/13/22   [provider]   amLODipine (NORVASC) 10 MG tablet Take 1 tablet (10 mg total) by mouth daily. Patient taking differently: Take 10 mg by mouth daily. 03/15/20 05/23/22  Strader, Lennart Pall, PA-C  Ascorbic Acid (VITAMIN C) 100 MG tablet Take 100 mg by mouth daily.    [provider]  aspirin 81 MG tablet Take 81 mg by mouth once a week.     [provider]  B Complex Vitamins (VITAMIN B COMPLEX) TABS See admin instructions.    [provider]  Blood Glucose Calibration (TRUE METRIX LEVEL 1) Low SOLN  03/19/22   [provider]  Cyanocobalamin (B-12 PO) Take 1 tablet by mouth daily.    [provider]  Famotidine-Ca Carb-Mag Hydrox (PEPCID COMPLETE PO) Take 1 tablet by mouth as needed (indigestion).    [provider]  ferrous sulfate 325 (65 FE) MG EC tablet Take 1 tablet (325 mg total) by mouth daily with breakfast. 04/25/21   Rojelio Brenner M, PA-C  hydrochlorothiazide (HYDRODIURIL) 25 MG tablet Take 25 mg by mouth daily. 04/22/11   [provider]  lisinopril (PRINIVIL,ZESTRIL) 40 MG tablet Take 40 mg by mouth daily. 04/22/11   [provider]  loratadine (CLARITIN) 10 MG tablet Take 1 tablet (10 mg total) by mouth daily. 02/16/16   Leroy Sea, MD  Multiple Vitamins-Minerals (VITAMIN D3 COMPLETE) TABS See admin instructions.    [provider]  nitroGLYCERIN (NITROSTAT) 0.4 MG SL  tablet Place 1 tablet (0.4 mg total) under the tongue every 5 (five) minutes as needed for chest pain. 11/19/17   Strader, Lennart Pall, PA-C  pantoprazole (PROTONIX) 40 MG tablet Take 40 mg by mouth daily. 10/21/22   [provider]  pravastatin (PRAVACHOL) 20 MG tablet Take 20 mg by mouth at bedtime.    [provider]  TRUE METRIX BLOOD GLUCOSE TEST test strip  06/07/18   [provider]  TRUEplus Lancets 33G MISC  09/25/22   [provider]  VITAMIN D PO Take 1 tablet by mouth daily.    [provider]       Allergies    Augmentin [amoxicillin-pot clavulanate], Amoxicillin, Clavulanic acid, Penicillin g, and Sulfa antibiotics    Review of Systems   Review of Systems  Constitutional:  Negative for chills and fever.  HENT:  Negative for ear pain and sore throat.   Eyes:  Negative for pain and visual disturbance.  Respiratory:  Positive for cough. Negative for chest tightness, shortness of breath and wheezing.   Cardiovascular:  Negative for palpitations.  Gastrointestinal:  Negative for abdominal pain and vomiting.  Genitourinary:  Negative for dysuria and hematuria.  Musculoskeletal:  Positive for back pain. Negative for arthralgias.  Skin:  Negative for color change and rash.  Neurological:  Negative for seizures and syncope.  All other systems reviewed and are negative.   Physical Exam Updated Vital Signs BP 102/74 (BP Location: Left Arm)   Pulse (!) 56   Temp 98.2 F (36.8 C) (Oral)   Resp 17   Ht 5\' 4"  (1.626 m)   Wt 74.4 kg   SpO2 96%   BMI 28.15 kg/m  Physical Exam Vitals and nursing note reviewed.  Constitutional:      General: She is not in acute distress.    Appearance: She is well-developed.  HENT:     Head: Normocephalic and atraumatic.  Eyes:     Conjunctiva/sclera: Conjunctivae normal.  Cardiovascular:     Rate and Rhythm: Normal rate and regular rhythm.     Heart sounds: No murmur heard. Pulmonary:     Effort: Pulmonary effort is normal. No respiratory distress.     Breath sounds: Normal breath sounds.  Abdominal:     Palpations: Abdomen is soft.     Tenderness: There is no abdominal tenderness.  Musculoskeletal:        General: No swelling.     Cervical back: Neck supple.  Skin:    General: Skin is warm and dry.     Capillary Refill: Capillary refill takes less than 2 seconds.  Neurological:     Mental Status: She is alert.  Psychiatric:        Mood and Affect: Mood normal.     ED Results / Procedures / Treatments   Labs (all labs ordered  are listed, but only abnormal results are displayed) Labs Reviewed  BASIC METABOLIC PANEL - Abnormal; Notable for the following components:      Result Value   Potassium 3.3 (*)    Chloride 96 (*)    Glucose, Bld 134 (*)    BUN 26 (*)    Creatinine, Ser 1.38 (*)    GFR, Estimated 38 (*)    All other components within normal limits  RESP PANEL BY RT-PCR (RSV, FLU A&B, COVID)  RVPGX2  CBC WITH DIFFERENTIAL/PLATELET    EKG None  Radiology DG Chest Portable 1 View  Result Date: 02/12/2023 CLINICAL DATA:  Cough  and congestion for 1 week. EXAM: PORTABLE CHEST 1 VIEW COMPARISON:  01/16/2022. FINDINGS: Bilateral lung fields are clear. Bilateral costophrenic angles are clear. Normal cardio-mediastinal silhouette. Small-to-moderate retrocardiac hiatal hernia is again noted. No acute osseous abnormalities. The soft tissues are within normal limits. IMPRESSION: *No acute cardiopulmonary abnormality. Electronically Signed   By: Jules Schick M.D.   On: 02/12/2023 09:29    Procedures Procedures    Medications Ordered in ED Medications  ipratropium-albuterol (DUONEB) 0.5-2.5 (3) MG/3ML nebulizer solution 3 mL (3 mLs Nebulization Given 02/12/23 0827)    ED Course/ Medical Decision Making/ A&P                                 Medical Decision Making Amount and/or Complexity of Data Reviewed Labs: ordered. Radiology: ordered.  Risk Prescription drug management.   82 yo female with pmh of gerd, asthma, hyperlipidemia, hypertension, and diabetes presenting for cough.  Patient is alert at x 3, no acute distress, afebrile, stable vital signs.  Physical exam demonstrates equal bilateral breath sounds with no adventitious lung sounds.  No hypoxia.  Chest x-ray demonstrates no focal pneumonias.  COVID, flu, RSV negative.  No leukocytosis or signs of sepsis.  DuoNeb treatment given for possible decreased breath sounds but very minimal.  No wheezing.   Dems likely secondary to ongoing viral  infection with cough versus bronchitis.  I recommend she continues her treatment of doxycycline and prednisone.  I recommend she continues her as needed albuterol and follows with her primary care physician in 3 to 5 days if she does not begin to feel better.  Patient in no distress and overall condition improved here in the ED. Detailed discussions were had with the patient regarding current findings, and need for close f/u with PCP or on call doctor. The patient has been instructed to return immediately if the symptoms worsen in any way for re-evaluation. Patient verbalized understanding and is in agreement with current care plan. All questions answered prior to discharge.               Final Clinical Impression(s) / ED Diagnoses Final diagnoses:  AKI (acute kidney injury) (HCC)  Acute cough  Bronchitis    Rx / DC Orders ED Discharge Orders     None         Franne Forts, DO 02/12/23 5409

## 2023-02-12 NOTE — ED Triage Notes (Signed)
Pt c/o chest pain and back pain that started last week; pt states she went to urgent care and was given doxycycline, prednisone and cough medication with codeine; pt states she is no longer coughing up sputum but still has a cough and it is worse at night time when she is sleeping  Pt was told to come here by urgent care if she was not getting any better

## 2023-04-21 ENCOUNTER — Ambulatory Visit: Payer: Medicare HMO | Admitting: Internal Medicine

## 2023-05-05 ENCOUNTER — Inpatient Hospital Stay: Payer: Medicare HMO | Attending: Hematology

## 2023-05-05 ENCOUNTER — Ambulatory Visit: Payer: Medicare HMO | Admitting: Internal Medicine

## 2023-05-05 ENCOUNTER — Encounter: Payer: Self-pay | Admitting: Internal Medicine

## 2023-05-05 ENCOUNTER — Ambulatory Visit (HOSPITAL_COMMUNITY)
Admission: RE | Admit: 2023-05-05 | Discharge: 2023-05-05 | Disposition: A | Discharge status: Home / Self Care | Payer: Medicare HMO | Source: Ambulatory Visit | Attending: Physician Assistant | Admitting: Physician Assistant

## 2023-05-05 VITALS — BP 126/77 | HR 87 | Temp 97.7°F | Ht 64.0 in | Wt 163.2 lb

## 2023-05-05 DIAGNOSIS — R936 Abnormal findings on diagnostic imaging of limbs: Secondary | ICD-10-CM | POA: Insufficient documentation

## 2023-05-05 DIAGNOSIS — D472 Monoclonal gammopathy: Secondary | ICD-10-CM | POA: Insufficient documentation

## 2023-05-05 DIAGNOSIS — Z8711 Personal history of peptic ulcer disease: Secondary | ICD-10-CM

## 2023-05-05 DIAGNOSIS — K219 Gastro-esophageal reflux disease without esophagitis: Secondary | ICD-10-CM

## 2023-05-05 DIAGNOSIS — K449 Diaphragmatic hernia without obstruction or gangrene: Secondary | ICD-10-CM

## 2023-05-05 DIAGNOSIS — Z8051 Family history of malignant neoplasm of kidney: Secondary | ICD-10-CM | POA: Insufficient documentation

## 2023-05-05 DIAGNOSIS — Z8719 Personal history of other diseases of the digestive system: Secondary | ICD-10-CM | POA: Diagnosis not present

## 2023-05-05 DIAGNOSIS — Z860101 Personal history of adenomatous and serrated colon polyps: Secondary | ICD-10-CM | POA: Insufficient documentation

## 2023-05-05 DIAGNOSIS — D500 Iron deficiency anemia secondary to blood loss (chronic): Secondary | ICD-10-CM

## 2023-05-05 DIAGNOSIS — Z808 Family history of malignant neoplasm of other organs or systems: Secondary | ICD-10-CM | POA: Insufficient documentation

## 2023-05-05 DIAGNOSIS — D509 Iron deficiency anemia, unspecified: Secondary | ICD-10-CM | POA: Insufficient documentation

## 2023-05-05 LAB — CBC WITH DIFFERENTIAL/PLATELET
Abs Immature Granulocytes: 0.02 10*3/uL (ref 0.00–0.07)
Basophils Absolute: 0 10*3/uL (ref 0.0–0.1)
Basophils Relative: 1 %
Eosinophils Absolute: 0.1 10*3/uL (ref 0.0–0.5)
Eosinophils Relative: 2 %
HCT: 43.9 % (ref 36.0–46.0)
Hemoglobin: 14 g/dL (ref 12.0–15.0)
Immature Granulocytes: 0 %
Lymphocytes Relative: 41 %
Lymphs Abs: 2.4 10*3/uL (ref 0.7–4.0)
MCH: 29.1 pg (ref 26.0–34.0)
MCHC: 31.9 g/dL (ref 30.0–36.0)
MCV: 91.3 fL (ref 80.0–100.0)
Monocytes Absolute: 0.6 10*3/uL (ref 0.1–1.0)
Monocytes Relative: 10 %
Neutro Abs: 2.8 10*3/uL (ref 1.7–7.7)
Neutrophils Relative %: 46 %
Platelets: 202 10*3/uL (ref 150–400)
RBC: 4.81 MIL/uL (ref 3.87–5.11)
RDW: 14.2 % (ref 11.5–15.5)
WBC: 6 10*3/uL (ref 4.0–10.5)
nRBC: 0 % (ref 0.0–0.2)

## 2023-05-05 LAB — COMPREHENSIVE METABOLIC PANEL
ALT: 18 U/L (ref 0–44)
AST: 23 U/L (ref 15–41)
Albumin: 4.5 g/dL (ref 3.5–5.0)
Alkaline Phosphatase: 52 U/L (ref 38–126)
Anion gap: 10 (ref 5–15)
BUN: 21 mg/dL (ref 8–23)
CO2: 29 mmol/L (ref 22–32)
Calcium: 9.9 mg/dL (ref 8.9–10.3)
Chloride: 100 mmol/L (ref 98–111)
Creatinine, Ser: 0.94 mg/dL (ref 0.44–1.00)
GFR, Estimated: >60 mL/min (ref >60)
Glucose, Bld: 110 mg/dL — ABNORMAL HIGH (ref 70–99)
Potassium: 3.7 mmol/L (ref 3.5–5.1)
Sodium: 139 mmol/L (ref 135–145)
Total Bilirubin: 0.6 mg/dL (ref 0.0–1.2)
Total Protein: 8.3 g/dL — ABNORMAL HIGH (ref 6.5–8.1)

## 2023-05-05 LAB — FERRITIN: Ferritin: 57 ng/mL (ref 11–307)

## 2023-05-05 LAB — IRON AND TIBC
Iron: 77 ug/dL (ref 28–170)
Saturation Ratios: 20 % (ref 10.4–31.8)
TIBC: 380 ug/dL (ref 250–450)
UIBC: 303 ug/dL

## 2023-05-05 LAB — LACTATE DEHYDROGENASE: LDH: 137 U/L (ref 98–192)

## 2023-05-05 NOTE — Patient Instructions (Signed)
It was good to see you again today.  You appear to be doing very well from a GI standpoint.  GERD information provided  As long as you only need Pepcid on the order of once weekly, no further evaluation is needed.  You do not need a future colonoscopy  Plan to see you back in 1 year and as needed.

## 2023-05-05 NOTE — Progress Notes (Signed)
Primary Care Physician:  [facility] Primary Gastroenterologist:  Dr. Jena Gauss  Pre-Procedure History & Physical: HPI:  Stacey Riley is a 83 y.o. female here for history of large hiatal hernia with [name] lesions, small adenoma, diverticulosis on prior colonoscopy (aged out of future exams) presents for follow-up.  History of empiric Maloney dilation for dysphagia.  Dysphagia resolved.  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  Past Medical History:  Diagnosis Date   Anemia 03/14/2016   Arthritis    Asthma    Bradycardia    DM (diabetes mellitus) (HCC)    GERD (gastroesophageal reflux disease)    High cholesterol    HTN (hypertension)     Past Surgical History:  Procedure Laterality Date   CHOLECYSTECTOMY  1990s   COLONOSCOPY  03/18/1999   Dr Noemi Ishmael-int  hemorrhoids, pancolonic diverticula   COLONOSCOPY N/A 09/26/2016   Dr. Jena Gauss: Diverticulosis, 4 mm polyp removed from the descending colon which is a tubular adenoma.  No future colonoscopies unless new symptoms arise.   COLONOSCOPY WITH ESOPHAGOGASTRODUODENOSCOPY (EGD)  08/06/2011   [name]: Schatzki ring, moderate sized hiatal hernia status post dilation for history of dysphagia. Pancolonic diverticula, single diminutive polyp at the base of the cecum removed (tubular adenoma). Next colonoscopy recommended for April 2018   ESOPHAGOGASTRODUODENOSCOPY  07/02/01   Dr Kelbie Moro-Schatzki's ring s/p 23F maloney dilation, otherwise normal/small hiatal hernia/   Linear erosion and ulceration in the proximal stomach/  The ulcerated lesion in the proximal stomach  benign.This may be a [name] lesion secondary to trauma to the mucosa, straddling the  diaphragmatic hiatus in the presence of a hiatal hernia    ESOPHAGOGASTRODUODENOSCOPY N/A 02/14/2016   Dr. Jena Gauss: Large hiatal hernia, [name] lesion likely explains bleeding.   MALONEY DILATION  08/06/2011   Procedure: [name] DILATION;   Surgeon: [name], MD;  Location: [facility];  Service: Endoscopy;  Laterality: N/A;   POLYPECTOMY  09/26/2016   Procedure: POLYPECTOMY;  Surgeon: [name], MD;  Location: [facility];  Service: Endoscopy;;  descending colon   TUBAL LIGATION      Prior to Admission medications   Medication Sig Start Date End Date Taking? Authorizing Provider  acetaminophen (TYLENOL) 500 MG tablet Take 500 mg by mouth daily as needed for headache.   Yes [provider]  albuterol (VENTOLIN HFA) 108 (90 Base) MCG/ACT inhaler Inhale 2 puffs into the lungs every 6 (six) hours as needed for wheezing or shortness of breath.   Yes [provider]  Alcohol Swabs (DROPSAFE ALCOHOL PREP) 70 % PADS Apply topically. 10/13/22  Yes [provider]  amLODipine (NORVASC) 10 MG tablet Take 1 tablet (10 mg total) by mouth daily. Patient taking differently: Take 10 mg by mouth daily. 03/15/20 05/05/23 Yes Strader, Lennart Pall, PA-C  Ascorbic Acid (VITAMIN C) 100 MG tablet Take 100 mg by mouth daily.   Yes [provider]  aspirin 81 MG tablet Take 81 mg by mouth once a week.    Yes [provider]  B Complex Vitamins (VITAMIN B COMPLEX) TABS See admin instructions.   Yes [provider]  Blood Glucose Calibration (TRUE METRIX LEVEL 1) Low SOLN  03/19/22  Yes [provider]  Cyanocobalamin (B-12 PO) Take 1 tablet by mouth daily.   Yes [provider]  Famotidine-Ca Carb-Mag Hydrox (PEPCID COMPLETE PO) Take 1 tablet by mouth as needed (indigestion).   Yes [provider]  ferrous sulfate 325 (65 FE) MG EC tablet Take 1 tablet (325 mg total) by mouth daily with breakfast. 04/25/21  Yes Pennington, Rebekah M, PA-C  hydrochlorothiazide (HYDRODIURIL) 25 MG tablet Take 25 mg by mouth daily. 04/22/11  Yes [provider]  lisinopril (PRINIVIL,ZESTRIL) 40 MG tablet Take 40 mg by mouth daily. 04/22/11  Yes [provider]  loratadine  (CLARITIN) 10 MG tablet Take 1 tablet (10 mg total) by mouth daily. 02/16/16  Yes Leroy Sea, MD  Multiple Vitamins-Minerals (VITAMIN D3 COMPLETE) TABS See admin instructions.   Yes [provider]  pravastatin (PRAVACHOL) 20 MG tablet Take 20 mg by mouth at bedtime.   Yes [provider]  TRUE METRIX BLOOD GLUCOSE TEST test strip  06/07/18  Yes [provider]  TRUEplus Lancets 33G MISC  09/25/22  Yes [provider]  VITAMIN D PO Take 1 tablet by mouth daily.   Yes [provider]  nitroGLYCERIN (NITROSTAT) 0.4 MG SL tablet Place 1 tablet (0.4 mg total) under the tongue every 5 (five) minutes as needed for chest pain. Patient not taking: Reported on 05/05/2023 11/19/17   [name], PA-C    Allergies as of 05/05/2023 - Review Complete 05/05/2023  Allergen Reaction Noted   Augmentin [amoxicillin-pot clavulanate] Nausea And Vomiting 02/14/2016   Amoxicillin Other (See Comments) 05/05/2019   Clavulanic acid Nausea And Vomiting 07/08/2017   Penicillin g Nausea And Vomiting 07/08/2017   Sulfa antibiotics Swelling 07/14/2011    Family History  Problem Relation Age of Onset   Aneurysm Daughter    Stroke Daughter    Diabetes Daughter    Hypertension Mother    Dementia Mother    Hypertension Father    Kidney cancer Brother    Cancer Brother        brain   Cancer Brother        bone   Stroke Brother    Hypertension Daughter    Hypertension Son    Hyperlipidemia Son    Colon cancer Neg Hx    Inflammatory bowel disease Neg Hx     Social History   Socioeconomic History   Marital status: Divorced    Spouse name: Not on file   Number of children: 5   Years of education: Not on file   Highest education level: Not on file  Occupational History   Occupation: retired; Designer, fashion/clothing  Tobacco Use   Smoking status: Never   Smokeless tobacco: Never  Vaping Use   Vaping status: Never Used  Substance and Sexual Activity   Alcohol use:  No   Drug use: No   Sexual activity: Not on file  Other Topics Concern   Not on file  Social History Narrative   Lives alone   Social Drivers of Health   Financial Resource Strain: Low Risk  (03/19/2020)   Overall Financial Resource Strain (CARDIA)    Difficulty of Paying Living Expenses: Not hard at all  Food Insecurity: No Food Insecurity (03/19/2020)   Hunger Vital Sign    Worried About Running Out of Food in the Last Year: Never true    Ran Out of Food in the Last Year: Never true  Transportation Needs: No Transportation Needs (03/19/2020)   PRAPARE - Administrator, Civil Service (Medical): No    Lack of Transportation (Non-Medical): No  Physical Activity: Inactive (03/19/2020)   Exercise Vital Sign    Days of Exercise per Week: 0 days    Minutes  of Exercise per Session: 0 min  Stress: No Stress Concern Present (03/19/2020)   [employer] of Occupational Health - Occupational Stress Questionnaire    Feeling of Stress : Not at all  Social Connections: Unknown (08/25/2021)   Received from [facility]   Social Network    Social Network: Not on file  Intimate Partner Violence: Unknown (07/17/2021)   Received from [facility]   HITS    Physically Hurt: Not on file    Insult or Talk Down To: Not on file    Threaten Physical Harm: Not on file    Scream or Curse: Not on file    Review of Systems: See HPI, otherwise negative ROS  Physical Exam: BP 126/77 (BP Location: Right Arm, Patient Position: Sitting, Cuff Size: Normal)   Pulse 87   Temp 97.7 F (36.5 C) (Oral)   Ht 5\' 4"  (1.626 m)   Wt 163 lb 3.2 oz (74 kg)   SpO2 98%   BMI 28.01 kg/m  General:   Alert,  Well-developed, well-nourished, pleasant and cooperative in NAD  Impression/Plan: Very pleasant sharp 83 year old lady with history of uncomplicated GERD, history large hiatal hernia and Cameron lesions - doing very well from a GI standpoint at this time.  Distant  history of colonic adenomas / diverticulosis.  Clinically pretty much asymptomatic except for occasional reflux on the order once week when she overindulges with diet.  Currently, no alarm symptoms.  Recommendations:   GERD information provided  As long as Pepcid not needed more than on the order of once weekly, no further GI intervention warranted.  Plan to see back in 1 year and as needed.   Notice: This dictation was prepared with Dragon dictation along with smaller phrase technology. Any transcriptional errors that result from this process are unintentional and may not be corrected upon review.

## 2023-05-06 LAB — KAPPA/LAMBDA LIGHT CHAINS
Kappa free light chain: 25.2 mg/L — ABNORMAL HIGH (ref 3.3–19.4)
Kappa, lambda light chain ratio: 1.25 (ref 0.26–1.65)
Lambda free light chains: 20.2 mg/L (ref 5.7–26.3)

## 2023-05-11 NOTE — Progress Notes (Unsigned)
[facility] [address], [place] [address]   CLINIC:  Medical Oncology/Hematology  PCP:  [facility] [address] [place] [address] [phone]   REASON FOR VISIT:  Follow-up for MGUS and iron deficiency anemia   PRIOR THERAPY: Intermittent parenteral iron therapy   CURRENT THERAPY: Surveillance, iron pill  INTERVAL HISTORY:   [name] 83 y.o. female returns for routine follow-up of MGUS and iron deficiency anemia.  She was last evaluated via telemedicine visit by Rojelio Brenner PA-C on 11/07/2022.  At today's visit, she reports feeling fairly well.  She denies any recent hospitalizations, surgeries, or changes in baseline health status.   She denies any recent bleeding such as epistaxis, hematemesis, hematochezia, or melena.   She reports that she has intermittent fatigue that depends on the day, with some days where she feels "real tired;" she notes decreased stamina and states that she tires quickly after her activities.  She does not have any headaches, lightheadedness, syncope, pica, restless legs, chest pain, or dyspnea on exertion.  She is taking iron tablet daily without any significant side effects.     She has ongoing pain in her left knee, but denies any new bone pain or recent fractures.  She reports occasional hot flashes and mild night sweats on her face.  She denies any unexplained fever, chills, or weight loss.  No new neurologic symptoms such as tinnitus, new-onset hearing loss, blurred vision, headache, or dizziness.   She has some chronic and intermittent numbness / tingling in feet.   No thromboembolic events since her last visit.  No new masses or lymphadenopathy per her report.   She has 50% energy and 100% appetite. She endorses that she is maintaining a stable weight.  ASSESSMENT & PLAN:  1.   IgG lambda MGUS: - Diagnosed in 2018.  Levels have been relatively stable since that time, with a 1% chance  per year of progression to multiple myeloma. - Immunofixation confirms IgG monoclonal protein with lambda light chain specificity - 24-hour urine/UPEP (11/11/2022) within normal limits - Most recent skeletal survey (10/31/2022): 2 nonspecific eccentric lucencies medially in the left mid femoral shaft. - Left femur x-ray (05/05/2023): No evidence of fracture or focal bone lesions, per radiology report. - Most recent myeloma panel (05/05/2023):  SPEP pending Normal FLC ratio 1.25 (minimally elevated kappa 25.2, normal lambda 20.2) Normal LDH Calcium 9.9, creatinine 0.94, Hgb 14.0.  LDH normal. - No new bone pain or B symptoms. - PLAN: RTC in 6 months for repeat myeloma/MGUS panel and SKELETAL SURVEY. ## ADDENDUM: M-spike stable at 0.8.  Rojelio Brenner PA-C 05/14/23 9:14 AM)  2.  Iron deficiency anemia: - Thought to be secondary to chronic GI blood loss - EGD (November 2017): Cameron lesions in the stomach, considered to be possible source of bleeding - Colonoscopy (09/26/2016): 4 mm polyp with pathology showing tubular adenoma - She is required intermittent IV iron, most recently with IV Venofer 300 mg x 3 in July/August 2023 - No epistaxis, hematemesis, hematochezia, or melena. - She has been taking ferrous sulfate daily since January 2023, which she is tolerating well  - Most recent labs (05/05/2023): Hgb 14.0, ferritin 57, iron saturation 20% - PLAN: Although ferritin is <100 and she has some fatigue, patient declined IV iron at this time. - Recommend patient that she continue iron pill daily. - Repeat CBC/iron panel and RTC in 6 months.   3.  Left ankle lesion -  Onset of "new mole" to left lateral ankle around May 2024 - Patient reported evolving lesion that was increasing in size, changing color, and becoming pruritic in the short time since it appeared - On exam (11/07/2022), lesion is multicolored (grayish base with purpleish black raised area), irregular border, asymmetric, and  approximate 1.0 cm diameter (pictured below) - Seen by dermatology [employer] PA-C at Dr. Scharlene Gloss office) on 11/26/2022. I have reviewed records sent by dermatology office. Suspicious lesion on left ankle diagnosed as "irritated seborrheic keratosis," with instructions to return to dermatology clinic as needed    PLAN SUMMARY:  >> SKELETAL SURVEY in 6 months >> Labs in 6 months = CBC/D, CMP, LDH, iron/TIBC, ferritin, SPEP, light chains >> OFFICE visit in 6 months (1 week after labs/x-ray)     REVIEW OF SYSTEMS:   Review of Systems  Constitutional:  Positive for fatigue ("comes and goes"). Negative for appetite change, chills, diaphoresis, fever and unexpected weight change.  HENT:   Negative for lump/mass and nosebleeds.   Eyes:  Negative for eye problems.  Respiratory:  Negative for cough, hemoptysis and shortness of breath.   Cardiovascular:  Negative for chest pain, leg swelling and palpitations.  Gastrointestinal:  Negative for abdominal pain, blood in stool, constipation, diarrhea, nausea and vomiting.  Genitourinary:  Negative for hematuria.   Musculoskeletal:  Positive for arthralgias ("arthritis").  Skin: Negative.   Neurological:  Negative for dizziness, headaches and light-headedness.  Hematological:  Does not bruise/bleed easily.     PHYSICAL EXAM:  ECOG PERFORMANCE STATUS: 1 - Symptomatic but completely ambulatory  Vitals:   05/12/23 1000  BP: (!) 148/43  Pulse: 91  Resp: 18  Temp: 98 F (36.7 C)  SpO2: 97%   Filed Weights   05/12/23 1000  Weight: 162 lb 14.7 oz (73.9 kg)   Physical Exam Constitutional:      Appearance: Normal appearance. She is obese.  Cardiovascular:     Heart sounds: Normal heart sounds.  Pulmonary:     Breath sounds: Normal breath sounds.  Neurological:     General: No focal deficit present.     Mental Status: Mental status is at baseline.  Psychiatric:        Behavior: Behavior normal. Behavior is cooperative.     PAST  MEDICAL/SURGICAL HISTORY:  Past Medical History:  Diagnosis Date   Anemia 03/14/2016   Arthritis    Asthma    Bradycardia    DM (diabetes mellitus) (HCC)    GERD (gastroesophageal reflux disease)    High cholesterol    HTN (hypertension)    Past Surgical History:  Procedure Laterality Date   CHOLECYSTECTOMY  1990s   COLONOSCOPY  03/18/1999   Dr Rourk-int  hemorrhoids, pancolonic diverticula   COLONOSCOPY N/A 09/26/2016   Dr. Jena Gauss: Diverticulosis, 4 mm polyp removed from the descending colon which is a tubular adenoma.  No future colonoscopies unless new symptoms arise.   COLONOSCOPY WITH ESOPHAGOGASTRODUODENOSCOPY (EGD)  08/06/2011   Rourk: Schatzki ring, moderate sized hiatal hernia status post dilation for history of dysphagia. Pancolonic diverticula, single diminutive polyp at the base of the cecum removed (tubular adenoma). Next colonoscopy recommended for April 2018   ESOPHAGOGASTRODUODENOSCOPY  07/02/01   Dr Rourk-Schatzki's ring s/p 98F maloney dilation, otherwise normal/small hiatal hernia/   Linear erosion and ulceration in the proximal stomach/  The ulcerated lesion in the proximal stomach  benign.This may be a [name] lesion secondary to trauma to the mucosa, straddling the  diaphragmatic hiatus in the  presence of a hiatal hernia    ESOPHAGOGASTRODUODENOSCOPY N/A 02/14/2016   Dr. Jena Gauss: Large hiatal hernia, [name] lesion likely explains bleeding.   MALONEY DILATION  08/06/2011   Procedure: [name] DILATION;  Surgeon: [name], MD;  Location: [facility];  Service: Endoscopy;  Laterality: N/A;   POLYPECTOMY  09/26/2016   Procedure: POLYPECTOMY;  Surgeon: [name], MD;  Location: [facility];  Service: Endoscopy;;  descending colon   TUBAL LIGATION      SOCIAL HISTORY:  Social History   Socioeconomic History   Marital status: Divorced    Spouse name: Not on file   Number of children: 5   Years of education: Not on file   Highest education level: Not on  file  Occupational History   Occupation: retired; Designer, fashion/clothing  Tobacco Use   Smoking status: Never   Smokeless tobacco: Never  Vaping Use   Vaping status: Never Used  Substance and Sexual Activity   Alcohol use: No   Drug use: No   Sexual activity: Not on file  Other Topics Concern   Not on file  Social History Narrative   Lives alone   Social Drivers of Health   Financial Resource Strain: Low Risk  (03/19/2020)   Overall Financial Resource Strain (CARDIA)    Difficulty of Paying Living Expenses: Not hard at all  Food Insecurity: No Food Insecurity (03/19/2020)   Hunger Vital Sign    Worried About Running Out of Food in the Last Year: Never true    Ran Out of Food in the Last Year: Never true  Transportation Needs: No Transportation Needs (03/19/2020)   PRAPARE - Administrator, Civil Service (Medical): No    Lack of Transportation (Non-Medical): No  Physical Activity: Inactive (03/19/2020)   Exercise Vital Sign    Days of Exercise per Week: 0 days    Minutes of Exercise per Session: 0 min  Stress: No Stress Concern Present (03/19/2020)   [employer] of Occupational Health - Occupational Stress Questionnaire    Feeling of Stress : Not at all  Social Connections: Unknown (08/25/2021)   [facility] from [facility], [facility]   Social Network    Social Network: Not on file  Intimate Partner Violence: Unknown (07/17/2021)   Received from [facility], [facility]   HITS    Physically Hurt: Not on file    Insult or Talk Down To: Not on file    Threaten Physical Harm: Not on file    Scream or Curse: Not on file    FAMILY HISTORY:  Family History  Problem Relation Age of Onset   Aneurysm Daughter    Stroke Daughter    Diabetes Daughter    Hypertension Mother    Dementia Mother    Hypertension Father    Kidney cancer Brother    Cancer Brother        brain   Cancer Brother        bone   Stroke Brother    Hypertension Daughter    Hypertension Son     Hyperlipidemia Son    Colon cancer Neg Hx    Inflammatory bowel disease Neg Hx     CURRENT MEDICATIONS:  Outpatient Encounter Medications as of 05/12/2023  Medication Sig Note   acetaminophen (TYLENOL) 500 MG tablet Take 500 mg by mouth daily as needed for headache.    albuterol (VENTOLIN HFA) 108 (90 Base) MCG/ACT inhaler Inhale 2 puffs into the lungs every  6 (six) hours as needed for wheezing or shortness of breath.    Alcohol Swabs (DROPSAFE ALCOHOL PREP) 70 % PADS Apply topically.    Ascorbic Acid (VITAMIN C) 100 MG tablet Take 100 mg by mouth daily.    aspirin 81 MG tablet Take 81 mg by mouth once a week.     B Complex Vitamins (VITAMIN B COMPLEX) TABS See admin instructions.    Blood Glucose Calibration (TRUE METRIX LEVEL 1) Low SOLN     Cyanocobalamin (B-12 PO) Take 1 tablet by mouth daily.    Famotidine-Ca Carb-Mag Hydrox (PEPCID COMPLETE PO) Take 1 tablet by mouth as needed (indigestion).    ferrous sulfate 325 (65 FE) MG EC tablet Take 1 tablet (325 mg total) by mouth daily with breakfast.    hydrochlorothiazide (HYDRODIURIL) 25 MG tablet Take 25 mg by mouth daily.    lisinopril (PRINIVIL,ZESTRIL) 40 MG tablet Take 40 mg by mouth daily.    loratadine (CLARITIN) 10 MG tablet Take 1 tablet (10 mg total) by mouth daily.    Multiple Vitamins-Minerals (VITAMIN D3 COMPLETE) TABS See admin instructions.    pravastatin (PRAVACHOL) 20 MG tablet Take 20 mg by mouth at bedtime.    TRUE METRIX BLOOD GLUCOSE TEST test strip     TRUEplus Lancets 33G MISC     VITAMIN D PO Take 1 tablet by mouth daily.    [DISCONTINUED] nitroGLYCERIN (NITROSTAT) 0.4 MG SL tablet Place 1 tablet (0.4 mg total) under the tongue every 5 (five) minutes as needed for chest pain.    amLODipine (NORVASC) 10 MG tablet Take 1 tablet (10 mg total) by mouth daily. (Patient taking differently: Take 10 mg by mouth daily.) 01/16/2022: Patient still on amlodipine -- last dose yesterday   No facility-administered encounter  medications on file as of 05/12/2023.    ALLERGIES:  Allergies  Allergen Reactions   Augmentin [Amoxicillin-Pot Clavulanate] Nausea And Vomiting    Hematemesis Has patient had a PCN reaction causing immediate rash, facial/tongue/throat swelling, SOB or lightheadedness with hypotension: Unknown Has patient had a PCN reaction causing severe rash involving mucus membranes or skin necrosis: Unknown Has patient had a PCN reaction that required hospitalization: Yes Has patient had a PCN reaction occurring within the last 10 years: Yes If all of the above answers are "NO", then may proceed with Cephalosporin use.    Amoxicillin Other (See Comments)    Other reaction(s): GI Bleeding   Clavulanic Acid Nausea And Vomiting   Penicillin G Nausea And Vomiting    .Did it involve swelling of the face/tongue/throat, SOB, or low BP? No Did it involve sudden or severe rash/hives, skin peeling, or any reaction on the inside of your mouth or nose? Unknown Did you need to seek medical attention at a hospital or doctor's office? yes When did it last happen? Unknown      If all above answers are "NO", may proceed with cephalosporin use.    Sulfa Antibiotics Swelling    LABORATORY DATA:  I have reviewed the labs as listed.  CBC    Component Value Date/Time   WBC 6.0 05/05/2023 1013   RBC 4.81 05/05/2023 1013   HGB 14.0 05/05/2023 1013   HCT 43.9 05/05/2023 1013   PLT 202 05/05/2023 1013   MCV 91.3 05/05/2023 1013   MCH 29.1 05/05/2023 1013   MCHC 31.9 05/05/2023 1013   RDW 14.2 05/05/2023 1013   LYMPHSABS 2.4 05/05/2023 1013   MONOABS 0.6 05/05/2023 1013   EOSABS 0.1 05/05/2023  1013   BASOSABS 0.0 05/05/2023 1013      Latest Ref Rng & Units 05/05/2023   10:13 AM 02/12/2023    8:18 AM 10/31/2022    9:46 AM  CMP  Glucose 70 - 99 mg/dL 161  096  045   BUN 8 - 23 mg/dL 21  26  19    Creatinine 0.44 - 1.00 mg/dL 4.09  8.11  9.14   Sodium 135 - 145 mmol/L 139  136  139   Potassium 3.5 - 5.1  mmol/L 3.7  3.3  3.3   Chloride 98 - 111 mmol/L 100  96  103   CO2 22 - 32 mmol/L 29  27  28    Calcium 8.9 - 10.3 mg/dL 9.9  9.9  9.4   Total Protein 6.5 - 8.1 g/dL 8.3   8.1   Total Bilirubin 0.0 - 1.2 mg/dL 0.6   0.3   Alkaline Phos 38 - 126 U/L 52   50   AST 15 - 41 U/L 23   21   ALT 0 - 44 U/L 18   19     DIAGNOSTIC IMAGING:  I have independently reviewed the relevant imaging and discussed with the patient.   WRAP UP:  All questions were answered. The patient knows to call the clinic with any problems, questions or concerns.  Medical decision making: Moderate  Time spent on visit: I spent 20 minutes counseling the patient face to face. The total time spent in the appointment was 30 minutes and more than 50% was on counseling.  Carnella Guadalajara, PA-C  05/12/23 11:16 AM

## 2023-05-12 ENCOUNTER — Inpatient Hospital Stay: Payer: Medicare HMO | Admitting: Physician Assistant

## 2023-05-12 VITALS — BP 148/43 | HR 91 | Temp 98.0°F | Resp 18 | Wt 162.9 lb

## 2023-05-12 DIAGNOSIS — D472 Monoclonal gammopathy: Secondary | ICD-10-CM | POA: Diagnosis not present

## 2023-05-12 DIAGNOSIS — D500 Iron deficiency anemia secondary to blood loss (chronic): Secondary | ICD-10-CM

## 2023-05-12 NOTE — Patient Instructions (Signed)
[facility] at [facility] Discharge Instructions  You were seen today by Rojelio Brenner PA-C for the following conditions.  IRON DEFICIENCY: You should continue taking iron pill (ferrous sulfate 325 mg) daily.  Take this in the morning with a small glass of orange juice.   MGUS: As we discussed, this is an abnormal protein in your blood that can progress to a type of cancer known as multiple myeloma in about 1% of people.  At this time, your levels are stable.  You do not show any signs of progression to myeloma cancer at this time.  We will check your labs and whole-body x-rays again in 6 months.  FOLLOW-UP APPOINTMENT: Office visit in 6 months    - - - - - - - - - - - - - - - - - -    Thank you for choosing [facility] at [facility] to provide your oncology and hematology care.  To afford each patient quality time with our provider, please arrive at least 15 minutes before your scheduled appointment time.   If you have a lab appointment with the Cancer Center please come in thru the Main Entrance and check in at the main information desk.  You need to re-schedule your appointment should you arrive 10 or more minutes late.  We strive to give you quality time with our providers, and arriving late affects you and other patients whose appointments are after yours.  Also, if you no show three or more times for appointments you may be dismissed from the clinic at the providers discretion.     Again, thank you for choosing [facility].  Our hope is that these requests will decrease the amount of time that you wait before being seen by our physicians.       _____________________________________________________________  Should you have questions after your visit to [facility], please contact our office at [phone] and follow the prompts.  Our office hours are 8:00 a.m. and 4:30 p.m. Monday - Friday.  Please note that  voicemails left after 4:00 p.m. may not be returned until the following business day.  We are closed weekends and major holidays.  You do have access to a nurse 24-7, just call the main number to the clinic [phone] and do not press any options, hold on the line and a nurse will answer the phone.    For prescription refill requests, have your pharmacy contact our office and allow 72 hours.    Due to Covid, you will need to wear a mask upon entering the hospital. If you do not have a mask, a mask will be given to you at the Main Entrance upon arrival. For doctor visits, patients may have 1 support person age 39 or older with them. For treatment visits, patients can not have anyone with them due to social distancing guidelines and our immunocompromised population.

## 2023-05-13 LAB — PROTEIN ELECTROPHORESIS, SERUM
A/G Ratio: 1.1 (ref 0.7–1.7)
Albumin ELP: 4.1 g/dL (ref 2.9–4.4)
Alpha-1-Globulin: 0.2 g/dL (ref 0.0–0.4)
Alpha-2-Globulin: 0.9 g/dL (ref 0.4–1.0)
Beta Globulin: 1.1 g/dL (ref 0.7–1.3)
Gamma Globulin: 1.7 g/dL (ref 0.4–1.8)
Globulin, Total: 3.9 g/dL (ref 2.2–3.9)
M-Spike, %: 0.8 g/dL — ABNORMAL HIGH
Total Protein ELP: 8 g/dL (ref 6.0–8.5)

## 2023-06-05 ENCOUNTER — Ambulatory Visit: Payer: Medicare HMO | Admitting: Internal Medicine

## 2023-08-04 ENCOUNTER — Other Ambulatory Visit (HOSPITAL_COMMUNITY): Payer: Self-pay | Admitting: Internal Medicine

## 2023-08-04 DIAGNOSIS — Z1231 Encounter for screening mammogram for malignant neoplasm of breast: Secondary | ICD-10-CM

## 2023-08-05 ENCOUNTER — Encounter: Payer: Self-pay | Admitting: Internal Medicine

## 2023-08-05 ENCOUNTER — Ambulatory Visit: Payer: Medicare HMO | Attending: Internal Medicine | Admitting: Internal Medicine

## 2023-08-05 VITALS — BP 138/62 | HR 74 | Ht 64.0 in | Wt 163.0 lb

## 2023-08-05 DIAGNOSIS — R931 Abnormal findings on diagnostic imaging of heart and coronary circulation: Secondary | ICD-10-CM | POA: Insufficient documentation

## 2023-08-05 DIAGNOSIS — I10 Essential (primary) hypertension: Secondary | ICD-10-CM

## 2023-08-05 NOTE — Patient Instructions (Signed)
 Medication Instructions:  Your physician has recommended you make the following change in your medication:   -Discontinue Aspirin    *If you need a refill on your cardiac medications before your next appointment, please call your pharmacy*  Lab Work: None If you have labs (blood work) drawn today and your tests are completely normal, you will receive your results only by: MyChart Message (if you have MyChart) OR A paper copy in the mail If you have any lab test that is abnormal or we need to change your treatment, we will call you to review the results.  Testing/Procedures: None  Follow-Up: At [facility], you and your health needs are our priority.  As part of our continuing mission to provide you with exceptional heart care, our providers are all part of one team.  This team includes your primary Cardiologist (physician) and Advanced Practice Providers or APPs (Physician Assistants and Nurse Practitioners) who all work together to provide you with the care you need, when you need it.  Your next appointment:    Follow up as needed.   Provider:   You may see Vishnu P Mallipeddi, MD or one of the following Advanced Practice Providers on your designated Care Team:   [place], PA-C  [place], [place] Theotis Flake, [place]     We recommend signing up for the patient portal called "MyChart".  Sign up information is provided on this After Visit Summary.  MyChart is used to connect with patients for Virtual Visits (Telemedicine).  Patients are able to view lab/test results, encounter notes, upcoming appointments, etc.  Non-urgent messages can be sent to your provider as well.   To learn more about what you can do with MyChart, go to [email or web address].   Other Instructions

## 2023-08-05 NOTE — Addendum Note (Signed)
 Addended by: Casper Clement on: 08/05/2023 08:53 AM   Modules accepted: Orders

## 2023-08-05 NOTE — Progress Notes (Signed)
 Cardiology Office Note  Date: 08/05/2023   ID: Stacey, Riley 01-30-1941, MRN [id number]  PCP:  [facility]  Cardiologist:  Lasalle Pointer, MD Electrophysiologist:  None   Reason for Office Visit: Evaluation chest pain at the request of PCP   History of Present Illness: Stacey Riley is a 83 y.o. female known to have HTN, DM 2, GERD, HLD, asymptomatic bradycardia, hiatal hernia is here for follow-up visit.  No interval chest pains.  No other symptoms like DOE, dizziness, syncope, leg swelling and palpitations.  EKG today showed NSR, PACs and LVH with repolarization abnormalities.  Patient underwent NM stress test in 2019 that showed no evidence of ischemia. There were horizontal ST segment depressions of 1 mm noted in the inferior leads and V5 and V6. There was also a brief Wenckebach in the recovery.  Past Medical History:  Diagnosis Date   Anemia 03/14/2016   Arthritis    Asthma    Bradycardia    DM (diabetes mellitus) (HCC)    GERD (gastroesophageal reflux disease)    High cholesterol    HTN (hypertension)     Past Surgical History:  Procedure Laterality Date   CHOLECYSTECTOMY  1990s   COLONOSCOPY  03/18/1999   Dr Rourk-int  hemorrhoids, pancolonic diverticula   COLONOSCOPY N/A 09/26/2016   Dr. Riley Cheadle: Diverticulosis, 4 mm polyp removed from the descending colon which is a tubular adenoma.  No future colonoscopies unless new symptoms arise.   COLONOSCOPY WITH ESOPHAGOGASTRODUODENOSCOPY (EGD)  08/06/2011   Rourk: Schatzki ring, moderate sized hiatal hernia status post dilation for history of dysphagia. Pancolonic diverticula, single diminutive polyp at the base of the cecum removed (tubular adenoma). Next colonoscopy recommended for April 2018   ESOPHAGOGASTRODUODENOSCOPY  07/02/01   Dr Rourk-Schatzki's ring s/p 74F maloney dilation, otherwise normal/small hiatal hernia/   Linear erosion and ulceration in the proximal stomach/  The  ulcerated lesion in the proximal stomach  benign.This may be a Donelda Fujita lesion secondary to trauma to the mucosa, straddling the  diaphragmatic hiatus in the presence of a hiatal hernia    ESOPHAGOGASTRODUODENOSCOPY N/A 02/14/2016   Dr. Riley Cheadle: Large hiatal hernia, Donelda Fujita lesion likely explains bleeding.   MALONEY DILATION  08/06/2011   Procedure: Londa Rival DILATION;  Surgeon: Suzette Espy, MD;  [facility];  Service: Endoscopy;  Laterality: N/A;   POLYPECTOMY  09/26/2016   Procedure: POLYPECTOMY;  Surgeon: Suzette Espy, MD;  [facility];  Service: Endoscopy;;  descending colon   TUBAL LIGATION      Current Outpatient Medications  Medication Sig Dispense Refill   acetaminophen  (TYLENOL ) 500 MG tablet Take 500 mg by mouth daily as needed for headache.     albuterol  (VENTOLIN  HFA) 108 (90 Base) MCG/ACT inhaler Inhale 2 puffs into the lungs every 6 (six) hours as needed for wheezing or shortness of breath.     Alcohol Swabs (DROPSAFE ALCOHOL PREP) 70 % PADS Apply topically.     amLODipine  (NORVASC ) 10 MG tablet Take 1 tablet (10 mg total) by mouth daily. (Patient taking differently: Take 10 mg by mouth daily.) 30 tablet 0   Ascorbic Acid (VITAMIN C) 100 MG tablet Take 100 mg by mouth daily.     aspirin  81 MG tablet Take 81 mg by mouth once a week.      B Complex Vitamins (VITAMIN B COMPLEX) TABS See admin instructions.     Blood Glucose Calibration (TRUE METRIX LEVEL 1) Low  SOLN      Cyanocobalamin (B-12 PO) Take 1 tablet by mouth daily.     Famotidine-Ca Carb-Mag Hydrox (PEPCID COMPLETE PO) Take 1 tablet by mouth as needed (indigestion).     ferrous sulfate  325 (65 FE) MG EC tablet Take 1 tablet (325 mg total) by mouth daily with breakfast. 90 tablet 3   hydrochlorothiazide  (HYDRODIURIL ) 25 MG tablet Take 25 mg by mouth daily.     lisinopril  (PRINIVIL ,ZESTRIL ) 40 MG tablet Take 40 mg by mouth daily.     loratadine  (CLARITIN ) 10 MG tablet Take 1 tablet (10 mg total) by  mouth daily. 30 tablet 0   Multiple Vitamins-Minerals (VITAMIN D3 COMPLETE) TABS See admin instructions.     pravastatin  (PRAVACHOL ) 20 MG tablet Take 20 mg by mouth at bedtime.     TRUE METRIX BLOOD GLUCOSE TEST test strip      TRUEplus Lancets 33G MISC      VITAMIN D  PO Take 1 tablet by mouth daily.     No current facility-administered medications for this visit.   Allergies:  Augmentin [amoxicillin-pot clavulanate], Amoxicillin, Clavulanic acid, Penicillin g, and Sulfa antibiotics   Social History: The patient  reports that she has never smoked. She has never used smokeless tobacco. She reports that she does not drink alcohol and does not use drugs.   Family History: The patient's family history includes Aneurysm in her daughter; Cancer in her brother and brother; Dementia in her mother; Diabetes in her daughter; Hyperlipidemia in her son; Hypertension in her daughter, father, mother, and son; Kidney cancer in her brother; Stroke in her brother and daughter.   ROS:  Please see the history of present illness. Otherwise, complete review of systems is positive for none.  All other systems are reviewed and negative.   Physical Exam: VS:  BP 138/62 (BP Location: Left Arm, Patient Position: Sitting, Cuff Size: Normal)   Pulse 74   Ht 5\' 4"  (1.626 m)   Wt 163 lb (73.9 kg)   SpO2 97%   BMI 27.98 kg/m , BMI Body mass index is 27.98 kg/m.  Wt Readings from Last 3 Encounters:  08/05/23 163 lb (73.9 kg)  05/12/23 162 lb 14.7 oz (73.9 kg)  05/05/23 163 lb 3.2 oz (74 kg)    General: Patient appears comfortable at rest. HEENT: Conjunctiva and lids normal, oropharynx clear with moist mucosa. Neck: Supple, no elevated JVP or carotid bruits, no thyromegaly. Lungs: Clear to auscultation, nonlabored breathing at rest. Cardiac: Regular rate and rhythm, no S3 or significant systolic murmur, no pericardial rub. Abdomen: Soft, nontender, no hepatomegaly, bowel sounds present, no guarding or  rebound. Extremities: No pitting edema, distal pulses 2+. Skin: Warm and dry. Musculoskeletal: No kyphosis. Neuropsychiatric: Alert and oriented x3, affect grossly appropriate.  ECG:  An ECG dated 05/23/2022 was personally reviewed today and demonstrated:  NSR with no ST changes  Recent Labwork: 05/05/2023: ALT 18; AST 23; BUN 21; Creatinine, Ser 0.94; Hemoglobin 14.0; Platelets 202; Potassium 3.7; Sodium 139  No results found for: "CHOL", "TRIG", "HDL", "CHOLHDL", "VLDL", "LDLCALC", "LDLDIRECT"  Other Studies Reviewed Today: Stress test in 2019 Blood pressure demonstrated a hypertensive response to exercise. Horizontal ST segment depression ST segment depression of 1 mm was noted during stress in the II, III, aVF, V5 and V6 leads, and returning to baseline after 1-5 minutes of recovery. 2nd degree type I AV block (Wenckebach) noted in recovery. Defect 1: There is a medium defect of moderate severity present in the mid anteroseptal,  apical anterior, apical septal, apical inferior and apex location. This appears to be due to soft tissue attenuation. Regional wall motion is normal. Images better on stress than rest. This is a moderate risk study based on Duke treadmill score. However, myocardial perfusion is grossly normal with normal LV systolic function, thus putting this in the low to intermediate risk range. Nuclear stress EF: 57%.   Assessment and Plan:  Elevated coronary calcium score, 28 (38th percentile for age and sex matched control): No angina or DOE.  Doing great.  Currently on aspirin  once a week, will discontinue.  Continue pravastatin  20 mg nightly.  If she develops any angina during the future, will need stress test.  ER precautions for chest pain provided.  HTN, controlled: Continue amlodipine  10 mg once daily, HCTZ 25 mg once daily and lisinopril  40 mg once daily.  Management of HTN per PCP.  HLD, unknown values: Continue pravastatin  20 mg nightly.  HLD management per  PCP.   Medication Adjustments/Labs and Tests Ordered: Current medicines are reviewed at length with the patient today.  Concerns regarding medicines are outlined above.   Tests Ordered: Orders Placed This Encounter  Procedures   EKG 12-Lead    Medication Changes: No orders of the defined types were placed in this encounter.   Disposition:  Follow up PRN  Signed, Alexande Sheerin Beauford Bounds, MD, 08/05/2023 8:33 AM    [facility] [address], [place], [place] [address]

## 2023-09-09 ENCOUNTER — Ambulatory Visit (HOSPITAL_COMMUNITY)
Admission: RE | Admit: 2023-09-09 | Discharge: 2023-09-09 | Disposition: A | Discharge status: Home / Self Care | Source: Ambulatory Visit | Attending: Internal Medicine | Admitting: Internal Medicine

## 2023-09-09 DIAGNOSIS — Z1231 Encounter for screening mammogram for malignant neoplasm of breast: Secondary | ICD-10-CM | POA: Insufficient documentation

## 2023-11-02 ENCOUNTER — Ambulatory Visit (HOSPITAL_COMMUNITY)
Admission: RE | Admit: 2023-11-02 | Discharge: 2023-11-02 | Disposition: A | Discharge status: Home / Self Care | Source: Ambulatory Visit | Attending: Physician Assistant | Admitting: Physician Assistant

## 2023-11-02 ENCOUNTER — Inpatient Hospital Stay: Payer: Medicare HMO | Attending: Hematology

## 2023-11-02 DIAGNOSIS — D472 Monoclonal gammopathy: Secondary | ICD-10-CM | POA: Diagnosis present

## 2023-11-02 DIAGNOSIS — D509 Iron deficiency anemia, unspecified: Secondary | ICD-10-CM | POA: Diagnosis not present

## 2023-11-02 DIAGNOSIS — Z8051 Family history of malignant neoplasm of kidney: Secondary | ICD-10-CM | POA: Diagnosis not present

## 2023-11-02 DIAGNOSIS — M898X8 Other specified disorders of bone, other site: Secondary | ICD-10-CM | POA: Diagnosis not present

## 2023-11-02 DIAGNOSIS — L821 Other seborrheic keratosis: Secondary | ICD-10-CM | POA: Insufficient documentation

## 2023-11-02 DIAGNOSIS — Z808 Family history of malignant neoplasm of other organs or systems: Secondary | ICD-10-CM | POA: Insufficient documentation

## 2023-11-02 DIAGNOSIS — D500 Iron deficiency anemia secondary to blood loss (chronic): Secondary | ICD-10-CM

## 2023-11-02 LAB — CBC WITH DIFFERENTIAL/PLATELET
Abs Immature Granulocytes: 0.01 K/uL (ref 0.00–0.07)
Basophils Absolute: 0 K/uL (ref 0.0–0.1)
Basophils Relative: 1 %
Eosinophils Absolute: 0.1 K/uL (ref 0.0–0.5)
Eosinophils Relative: 3 %
HCT: 42.5 % (ref 36.0–46.0)
Hemoglobin: 13.9 g/dL (ref 12.0–15.0)
Immature Granulocytes: 0 %
Lymphocytes Relative: 48 %
Lymphs Abs: 2.4 K/uL (ref 0.7–4.0)
MCH: 29.6 pg (ref 26.0–34.0)
MCHC: 32.7 g/dL (ref 30.0–36.0)
MCV: 90.4 fL (ref 80.0–100.0)
Monocytes Absolute: 0.4 K/uL (ref 0.1–1.0)
Monocytes Relative: 8 %
Neutro Abs: 2 K/uL (ref 1.7–7.7)
Neutrophils Relative %: 40 %
Platelets: 189 K/uL (ref 150–400)
RBC: 4.7 MIL/uL (ref 3.87–5.11)
RDW: 13.8 % (ref 11.5–15.5)
WBC: 4.9 K/uL (ref 4.0–10.5)
nRBC: 0 % (ref 0.0–0.2)

## 2023-11-02 LAB — COMPREHENSIVE METABOLIC PANEL WITH GFR
ALT: 18 U/L (ref 0–44)
AST: 22 U/L (ref 15–41)
Albumin: 3.8 g/dL (ref 3.5–5.0)
Alkaline Phosphatase: 51 U/L (ref 38–126)
Anion gap: 10 (ref 5–15)
BUN: 16 mg/dL (ref 8–23)
CO2: 28 mmol/L (ref 22–32)
Calcium: 9.2 mg/dL (ref 8.9–10.3)
Chloride: 98 mmol/L (ref 98–111)
Creatinine, Ser: 1.01 mg/dL — ABNORMAL HIGH (ref 0.44–1.00)
GFR, Estimated: 55 mL/min — ABNORMAL LOW (ref >60)
Glucose, Bld: 183 mg/dL — ABNORMAL HIGH (ref 70–99)
Potassium: 3.7 mmol/L (ref 3.5–5.1)
Sodium: 136 mmol/L (ref 135–145)
Total Bilirubin: 0.7 mg/dL (ref 0.0–1.2)
Total Protein: 7.8 g/dL (ref 6.5–8.1)

## 2023-11-02 LAB — LACTATE DEHYDROGENASE: LDH: 136 U/L (ref 98–192)

## 2023-11-02 LAB — IRON AND TIBC
Iron: 62 ug/dL (ref 28–170)
Saturation Ratios: 18 % (ref 10.4–31.8)
TIBC: 348 ug/dL (ref 250–450)
UIBC: 286 ug/dL

## 2023-11-02 LAB — FERRITIN: Ferritin: 27 ng/mL (ref 11–307)

## 2023-11-03 LAB — KAPPA/LAMBDA LIGHT CHAINS
Kappa free light chain: 21.6 mg/L — ABNORMAL HIGH (ref 3.3–19.4)
Kappa, lambda light chain ratio: 1.11 (ref 0.26–1.65)
Lambda free light chains: 19.5 mg/L (ref 5.7–26.3)

## 2023-11-03 LAB — PROTEIN ELECTROPHORESIS, SERUM
A/G Ratio: 1.1 (ref 0.7–1.7)
Albumin ELP: 3.8 g/dL (ref 2.9–4.4)
Alpha-1-Globulin: 0.2 g/dL (ref 0.0–0.4)
Alpha-2-Globulin: 0.8 g/dL (ref 0.4–1.0)
Beta Globulin: 1 g/dL (ref 0.7–1.3)
Gamma Globulin: 1.5 g/dL (ref 0.4–1.8)
Globulin, Total: 3.5 g/dL (ref 2.2–3.9)
M-Spike, %: 0.7 g/dL — ABNORMAL HIGH
Total Protein ELP: 7.3 g/dL (ref 6.0–8.5)

## 2023-11-07 NOTE — Progress Notes (Unsigned)
 [facility] [address], [place] [address]   CLINIC:  Medical Oncology/Hematology  PCP:  [facility] [address] [place] [address] [phone]   REASON FOR VISIT:  Follow-up for MGUS and iron  deficiency anemia   PRIOR THERAPY: Intermittent parenteral iron  therapy   CURRENT THERAPY: Surveillance, iron  pill  INTERVAL HISTORY:   Stacey Riley 83 y.o. female returns for routine follow-up of MGUS and iron  deficiency anemia.   Stacey Riley was last seen by Stacey Barefoot PA-C on 05/12/2023.  At today's visit, Stacey Riley reports feeling fair.    Stacey Riley denies any recent bleeding such as epistaxis, hematemesis, hematochezia, or melena. Stacey Riley has some dark stool when Stacey Riley takes her iron  pill (every other day). Stacey Riley reports that Stacey Riley has intermittent fatigue that depends on the day, with some days where Stacey Riley feels real tired; Stacey Riley notes decreased stamina and states that Stacey Riley tires quickly after her activities.   Stacey Riley does not have any headaches, lightheadedness, syncope, pica, restless legs, chest pain, or dyspnea on exertion.   Stacey Riley is taking iron  tablet every other day without any significant side effects.     Stacey Riley has ongoing pain in her knees, but denies any new bone pain or recent fractures.  Stacey Riley reports occasional hot flashes and mild night sweats on her face.   Stacey Riley denies any unexplained fever, chills, or weight loss.   No new neurologic symptoms such as tinnitus, new-onset hearing loss, blurred vision, headache, or dizziness.    Stacey Riley has some chronic and intermittent numbness / tingling in feet.  No thromboembolic events since her last visit.   No new masses or lymphadenopathy per her report.   Stacey Riley has 50% energy and 100% appetite. Stacey Riley endorses that Stacey Riley is maintaining a stable weight.  ASSESSMENT & PLAN:  1.   IgG lambda MGUS: - Diagnosed in 2018.  Levels have been relatively stable since that time, with a 1% chance per year of progression to  multiple myeloma. - Immunofixation confirms IgG monoclonal protein with lambda light chain specificity - 24-hour urine/UPEP (11/11/2022) within normal limits - Most recent skeletal survey (11/02/2023): Lucent lesion in left mid femoral shaft and left humeral head, appearing similar to previous (10/31/2022). - Most recent myeloma panel (11/02/2023):  SPEP with M spike stable 0.7 Normal FLC ratio 1.11 (minimally elevated kappa 21.6, normal lambda 19.5) Normal LDH Calcium 9.2, creatinine 1.01, Hgb 13.9 - No new bone pain or B symptoms. - PLAN: RTC in 6 months for repeat myeloma/MGUS panel - Annual SKELETAL SURVEY due July 2026.   2.  Iron  deficiency anemia: - Thought to be secondary to chronic GI blood loss - EGD (November 2017): Cameron lesions in the stomach, considered to be possible source of bleeding - Colonoscopy (09/26/2016): 4 mm polyp with pathology showing tubular adenoma - Stacey Riley is required intermittent IV iron , most recently with IV Venofer  300 mg x 3 in July/August 2023 - No epistaxis, hematemesis, hematochezia, or melena. - Stacey Riley has been taking ferrous sulfate  daily since January 2023, which Stacey Riley is tolerating well  - Most recent labs (11/02/2023): Hgb 13.9, ferritin 27, iron  saturation 10% - PLAN: Recommend IV Venofer  400 mg x 2 - Recommend patient that Stacey Riley continue iron  pill daily. - Repeat CBC/iron  panel and RTC in 6 months.   3.  Left ankle lesion - Onset of new mole to left lateral ankle around May 2024 - Patient reported evolving lesion that was increasing in size, changing color, and  becoming pruritic in the short time since it appeared - On exam (11/07/2022), lesion is multicolored (grayish base with purpleish black raised area), irregular border, asymmetric, and approximately 1.0 cm diameter - Seen by dermatology [employer] PA-C at Dr. Milford office) on 11/26/2022. I have reviewed records sent by dermatology office. Suspicious lesion on left ankle diagnosed as irritated  seborrheic keratosis, with instructions to return to dermatology clinic as needed    PLAN SUMMARY:  >> Venofer  400 mg x 2 >> Labs in 6 months = CBC/D, CMP, LDH, iron /TIBC, ferritin, SPEP, light chains >> OFFICE visit in 6 months (1 week after labs)     REVIEW OF SYSTEMS:   Review of Systems  Constitutional:  Positive for fatigue (comes and goes). Negative for appetite change, chills, diaphoresis, fever and unexpected weight change.  HENT:   Negative for lump/mass and nosebleeds.   Eyes:  Negative for eye problems.  Respiratory:  Positive for shortness of breath (with exertion). Negative for cough and hemoptysis.   Cardiovascular:  Negative for chest pain, leg swelling and palpitations.  Gastrointestinal:  Negative for abdominal pain, blood in stool, constipation, diarrhea, nausea and vomiting.  Genitourinary:  Negative for hematuria.   Musculoskeletal:  Positive for arthralgias (arthritis).  Skin: Negative.   Neurological:  Positive for numbness. Negative for dizziness, headaches and light-headedness.  Hematological:  Does not bruise/bleed easily.  Psychiatric/Behavioral:  Positive for sleep disturbance.      PHYSICAL EXAM:  ECOG PERFORMANCE STATUS: 1 - Symptomatic but completely ambulatory  Vitals:   11/09/23 1438 11/09/23 1443  BP: (!) 157/68 (!) 159/71  Pulse: (!) 52   Resp: 16   Temp: 97.9 F (36.6 C)   SpO2: 95%     Filed Weights   11/09/23 1438  Weight: 162 lb 11.2 oz (73.8 kg)    Physical Exam Constitutional:      Appearance: Normal appearance. Stacey Riley is obese.  Cardiovascular:     Heart sounds: Normal heart sounds.  Pulmonary:     Breath sounds: Normal breath sounds.  Neurological:     General: No focal deficit present.     Mental Status: Mental status is at baseline.  Psychiatric:        Behavior: Behavior normal. Behavior is cooperative.     PAST MEDICAL/SURGICAL HISTORY:  Past Medical History:  Diagnosis Date   Anemia 03/14/2016   Arthritis     Asthma    Bradycardia    DM (diabetes mellitus) (HCC)    GERD (gastroesophageal reflux disease)    High cholesterol    HTN (hypertension)    Past Surgical History:  Procedure Laterality Date   CHOLECYSTECTOMY  1990s   COLONOSCOPY  03/18/1999   Dr Rourk-int  hemorrhoids, pancolonic diverticula   COLONOSCOPY N/A 09/26/2016   Dr. Shaaron: Diverticulosis, 4 mm polyp removed from the descending colon which is a tubular adenoma.  No future colonoscopies unless new symptoms arise.   COLONOSCOPY WITH ESOPHAGOGASTRODUODENOSCOPY (EGD)  08/06/2011   Rourk: Schatzki ring, moderate sized hiatal hernia status post dilation for history of dysphagia. Pancolonic diverticula, single diminutive polyp at the base of the cecum removed (tubular adenoma). Next colonoscopy recommended for April 2018   ESOPHAGOGASTRODUODENOSCOPY  07/02/01   Dr Rourk-Schatzki's ring s/p 75F maloney dilation, otherwise normal/small hiatal hernia/   Linear erosion and ulceration in the proximal stomach/  The ulcerated lesion in the proximal stomach  benign.This may be a Ole lesion secondary to trauma to the mucosa, straddling the  diaphragmatic hiatus in the  presence of a hiatal hernia    ESOPHAGOGASTRODUODENOSCOPY N/A 02/14/2016   Dr. Shaaron: Large hiatal hernia, Ole lesion likely explains bleeding.   MALONEY DILATION  08/06/2011   Procedure: AGAPITO DILATION;  Surgeon: Lamar CHRISTELLA Shaaron, MD;  [facility];  Service: Endoscopy;  Laterality: N/A;   POLYPECTOMY  09/26/2016   Procedure: POLYPECTOMY;  Surgeon: Shaaron Lamar CHRISTELLA, MD;  [facility];  Service: Endoscopy;;  descending colon   TUBAL LIGATION      SOCIAL HISTORY:  Social History   Socioeconomic History   Marital status: Divorced    Spouse name: Not on file   Number of children: 5   Years of education: Not on file   Highest education level: Not on file  Occupational History   Occupation: retired; Designer, fashion/clothing  Tobacco Use   Smoking status: Never    Smokeless tobacco: Never  Vaping Use   Vaping status: Never Used  Substance and Sexual Activity   Alcohol use: No   Drug use: No   Sexual activity: Not on file  Other Topics Concern   Not on file  Social History Narrative   Lives alone   Social Drivers of Health   Financial Resource Strain: Low Risk  (03/19/2020)   Overall Financial Resource Strain (CARDIA)    Difficulty of Paying Living Expenses: Not hard at all  Food Insecurity: No Food Insecurity (03/19/2020)   Hunger Vital Sign    Worried About Running Out of Food in the Last Year: Never true    Ran Out of Food in the Last Year: Never true  Transportation Needs: No Transportation Needs (03/19/2020)   PRAPARE - Administrator, Civil Service (Medical): No    Lack of Transportation (Non-Medical): No  Physical Activity: Inactive (03/19/2020)   Exercise Vital Sign    Days of Exercise per Week: 0 days    Minutes of Exercise per Session: 0 min  Stress: No Stress Concern Present (03/19/2020)   [employer] of Occupational Health - Occupational Stress Questionnaire    Feeling of Stress : Not at all  Social Connections: Unknown (08/25/2021)   [facility]   Social Network    Social Network: Not on file  Intimate Partner Violence: Unknown (07/17/2021)   Received from [facility]   HITS    Physically Hurt: Not on file    Insult or Talk Down To: Not on file    Threaten Physical Harm: Not on file    Scream or Curse: Not on file    FAMILY HISTORY:  Family History  Problem Relation Age of Onset   Aneurysm Daughter    Stroke Daughter    Diabetes Daughter    Hypertension Mother    Dementia Mother    Hypertension Father    Kidney cancer Brother    Cancer Brother        brain   Cancer Brother        bone   Stroke Brother    Hypertension Daughter    Hypertension Son    Hyperlipidemia Son    Colon cancer Neg Hx    Inflammatory bowel disease Neg Hx     CURRENT MEDICATIONS:  Outpatient  Encounter Medications as of 11/09/2023  Medication Sig   acetaminophen  (TYLENOL ) 500 MG tablet Take 500 mg by mouth daily as needed for headache.   albuterol  (VENTOLIN  HFA) 108 (90 Base) MCG/ACT inhaler Inhale 2 puffs into the lungs every 6 (six) hours as needed for  wheezing or shortness of breath.   Alcohol Swabs (DROPSAFE ALCOHOL PREP) 70 % PADS Apply topically.   amLODipine  (NORVASC ) 10 MG tablet Take 1 tablet (10 mg total) by mouth daily. (Patient taking differently: Take 10 mg by mouth daily.)   Ascorbic Acid (VITAMIN C) 100 MG tablet Take 100 mg by mouth daily.   aspirin  EC 81 MG tablet 1 tablet Orally Once a week   B Complex Vitamins (VITAMIN B COMPLEX) TABS See admin instructions.   Blood Glucose Calibration (TRUE METRIX LEVEL 1) Low SOLN    Blood Glucose Monitoring Suppl (TRUE METRIX METER) w/Device KIT USE AS DIRECTED; Duration: 90   Cyanocobalamin (B-12 PO) Take 1 tablet by mouth daily.   Famotidine-Ca Carb-Mag Hydrox (PEPCID COMPLETE PO) Take 1 tablet by mouth as needed (indigestion).   ferrous sulfate  325 (65 FE) MG EC tablet Take 1 tablet (325 mg total) by mouth daily with breakfast.   hydrochlorothiazide  (HYDRODIURIL ) 25 MG tablet Take 25 mg by mouth daily.   lisinopril  (PRINIVIL ,ZESTRIL ) 40 MG tablet Take 40 mg by mouth daily.   loratadine  (CLARITIN ) 10 MG tablet Take 1 tablet (10 mg total) by mouth daily.   Multiple Vitamins-Minerals (VITAMIN D3 COMPLETE) TABS See admin instructions.   pantoprazole  (PROTONIX ) 40 MG tablet TAKE 1 TABLET ONE TIME DAILY AS NEEDED FOR  REFLUX; Duration: 90   pravastatin  (PRAVACHOL ) 20 MG tablet Take 20 mg by mouth at bedtime.   TRUE METRIX BLOOD GLUCOSE TEST test strip    TRUEplus Lancets 33G MISC    VITAMIN D  PO Take 1 tablet by mouth daily.   No facility-administered encounter medications on file as of 11/09/2023.    ALLERGIES:  Allergies  Allergen Reactions   Augmentin [Amoxicillin-Pot Clavulanate] Nausea And Vomiting    Hematemesis Has  patient had a PCN reaction causing immediate rash, facial/tongue/throat swelling, SOB or lightheadedness with hypotension: Unknown Has patient had a PCN reaction causing severe rash involving mucus membranes or skin necrosis: Unknown Has patient had a PCN reaction that required hospitalization: Yes Has patient had a PCN reaction occurring within the last 10 years: Yes If all of the above answers are NO, then may proceed with Cephalosporin use.    Amoxicillin Other (See Comments)    Other reaction(s): GI Bleeding   Clavulanic Acid Nausea And Vomiting   Penicillin G Nausea And Vomiting    .Did it involve swelling of the face/tongue/throat, SOB, or low BP? No Did it involve sudden or severe rash/hives, skin peeling, or any reaction on the inside of your mouth or nose? Unknown Did you need to seek medical attention at a hospital or doctor's office? yes When did it last happen? Unknown      If all above answers are "NO", may proceed with cephalosporin use.    Sulfa Antibiotics Swelling    LABORATORY DATA:  I have reviewed the labs as listed.  CBC    Component Value Date/Time   WBC 4.9 11/02/2023 1035   RBC 4.70 11/02/2023 1035   HGB 13.9 11/02/2023 1035   HCT 42.5 11/02/2023 1035   PLT 189 11/02/2023 1035   MCV 90.4 11/02/2023 1035   MCH 29.6 11/02/2023 1035   MCHC 32.7 11/02/2023 1035   RDW 13.8 11/02/2023 1035   LYMPHSABS 2.4 11/02/2023 1035   MONOABS 0.4 11/02/2023 1035   EOSABS 0.1 11/02/2023 1035   BASOSABS 0.0 11/02/2023 1035      Latest Ref Rng & Units 11/02/2023   10:35 AM 05/05/2023   10:13  AM 02/12/2023    8:18 AM  CMP  Glucose 70 - 99 mg/dL 816  889  865   BUN 8 - 23 mg/dL 16  21  26    Creatinine 0.44 - 1.00 mg/dL 8.98  9.05  8.61   Sodium 135 - 145 mmol/L 136  139  136   Potassium 3.5 - 5.1 mmol/L 3.7  3.7  3.3   Chloride 98 - 111 mmol/L 98  100  96   CO2 22 - 32 mmol/L 28  29  27    Calcium 8.9 - 10.3 mg/dL 9.2  9.9  9.9   Total Protein 6.5 - 8.1 g/dL 7.8   8.3    Total Bilirubin 0.0 - 1.2 mg/dL 0.7  0.6    Alkaline Phos 38 - 126 U/L 51  52    AST 15 - 41 U/L 22  23    ALT 0 - 44 U/L 18  18      DIAGNOSTIC IMAGING:  I have independently reviewed the relevant imaging and discussed with the patient.   WRAP UP:  All questions were answered. The patient knows to call the clinic with any problems, questions or concerns.  Medical decision making: Moderate  Time spent on visit: I spent 20 minutes counseling the patient face to face. The total time spent in the appointment was 30 minutes and more than 50% was on counseling.  Stacey CHRISTELLA Barefoot, PA-C  11/09/23 3:14 PM

## 2023-11-09 ENCOUNTER — Inpatient Hospital Stay (HOSPITAL_BASED_OUTPATIENT_CLINIC_OR_DEPARTMENT_OTHER): Payer: Medicare HMO | Admitting: Physician Assistant

## 2023-11-09 VITALS — BP 145/73 | HR 52 | Temp 97.9°F | Resp 16 | Wt 162.7 lb

## 2023-11-09 DIAGNOSIS — D472 Monoclonal gammopathy: Secondary | ICD-10-CM

## 2023-11-09 DIAGNOSIS — D500 Iron deficiency anemia secondary to blood loss (chronic): Secondary | ICD-10-CM

## 2023-11-09 NOTE — Patient Instructions (Signed)
 [facility] at [facility] Discharge Instructions  You were seen today by Pleasant Barefoot PA-C for the following conditions.  IRON  DEFICIENCY: - Your iron  levels are continuing to drop. - We will schedule you for IV iron  x 2 doses. - You should continue taking iron  pill (ferrous sulfate  325 mg) every other day.  Take this in the morning with a small glass of orange juice.   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 - At this time, your levels are stable. - You do not show any signs of progression to myeloma cancer at this time. - We will check your labs again in 6 months.  FOLLOW-UP APPOINTMENT: Office visit in 6 months    - - - - - - - - - - - - - - - - - -    Thank you for choosing [facility] at [facility] to provide your oncology and hematology care.  To afford each patient quality time with our provider, please arrive at least 15 minutes before your scheduled appointment time.   If you have a lab appointment with the Cancer Center please come in thru the Main Entrance and check in at the main information desk.  You need to re-schedule your appointment should you arrive 10 or more minutes late.  We strive to give you quality time with our providers, and arriving late affects you and other patients whose appointments are after yours.  Also, if you no show three or more times for appointments you may be dismissed from the clinic at the providers discretion.     Again, thank you for choosing [facility].  Our hope is that these requests will decrease the amount of time that you wait before being seen by our physicians.       _____________________________________________________________  Should you have questions after your visit to [facility], please contact our office at [phone]  and follow the prompts.  Our office hours are 8:00 a.m. and 4:30 p.m. Monday - Friday.  Please note that voicemails left after 4:00 p.m. may not be returned until the following business day.  We are closed weekends and major holidays.  You do have access to a nurse 24-7, just call the main number to the clinic [phone] and do not press any options, hold on the line and a nurse will answer the phone.    For prescription refill requests, have your pharmacy contact our office and allow 72 hours.    Due to Covid, you will need to wear a mask upon entering the hospital. If you do not have a mask, a mask will be given to you at the Main Entrance upon arrival. For doctor visits, patients may have 1 support person age 26 or older with them. For treatment visits, patients can not have anyone with them due to social distancing guidelines and our immunocompromised population.

## 2023-11-16 ENCOUNTER — Observation Stay (HOSPITAL_BASED_OUTPATIENT_CLINIC_OR_DEPARTMENT_OTHER)
Admission: EM | Admit: 2023-11-16 | Discharge: 2023-11-17 | Disposition: A | Discharge status: Home / Self Care | Source: Home / Self Care | Attending: Emergency Medicine | Admitting: Emergency Medicine

## 2023-11-16 ENCOUNTER — Emergency Department (HOSPITAL_COMMUNITY)

## 2023-11-16 ENCOUNTER — Other Ambulatory Visit: Payer: Self-pay

## 2023-11-16 ENCOUNTER — Inpatient Hospital Stay: Attending: Hematology

## 2023-11-16 ENCOUNTER — Encounter (HOSPITAL_COMMUNITY): Payer: Self-pay

## 2023-11-16 VITALS — BP 72/47 | HR 149 | Temp 96.2°F | Resp 19

## 2023-11-16 DIAGNOSIS — R55 Syncope and collapse: Principal | ICD-10-CM | POA: Diagnosis present

## 2023-11-16 DIAGNOSIS — Z8719 Personal history of other diseases of the digestive system: Secondary | ICD-10-CM | POA: Insufficient documentation

## 2023-11-16 DIAGNOSIS — I10 Essential (primary) hypertension: Secondary | ICD-10-CM | POA: Insufficient documentation

## 2023-11-16 DIAGNOSIS — E876 Hypokalemia: Secondary | ICD-10-CM | POA: Diagnosis present

## 2023-11-16 DIAGNOSIS — D472 Monoclonal gammopathy: Secondary | ICD-10-CM | POA: Diagnosis present

## 2023-11-16 DIAGNOSIS — R001 Bradycardia, unspecified: Secondary | ICD-10-CM | POA: Insufficient documentation

## 2023-11-16 DIAGNOSIS — E119 Type 2 diabetes mellitus without complications: Secondary | ICD-10-CM | POA: Diagnosis not present

## 2023-11-16 DIAGNOSIS — Z7982 Long term (current) use of aspirin: Secondary | ICD-10-CM | POA: Insufficient documentation

## 2023-11-16 DIAGNOSIS — J45909 Unspecified asthma, uncomplicated: Secondary | ICD-10-CM | POA: Diagnosis not present

## 2023-11-16 DIAGNOSIS — D509 Iron deficiency anemia, unspecified: Secondary | ICD-10-CM | POA: Diagnosis not present

## 2023-11-16 DIAGNOSIS — D500 Iron deficiency anemia secondary to blood loss (chronic): Secondary | ICD-10-CM

## 2023-11-16 LAB — COMPREHENSIVE METABOLIC PANEL WITH GFR
ALT: 15 U/L (ref 0–44)
AST: 25 U/L (ref 15–41)
Albumin: 3.5 g/dL (ref 3.5–5.0)
Alkaline Phosphatase: 46 U/L (ref 38–126)
Anion gap: 12 (ref 5–15)
BUN: 22 mg/dL (ref 8–23)
CO2: 22 mmol/L (ref 22–32)
Calcium: 8.5 mg/dL — ABNORMAL LOW (ref 8.9–10.3)
Chloride: 103 mmol/L (ref 98–111)
Creatinine, Ser: 1.02 mg/dL — ABNORMAL HIGH (ref 0.44–1.00)
GFR, Estimated: 54 mL/min — ABNORMAL LOW (ref >60)
Glucose, Bld: 192 mg/dL — ABNORMAL HIGH (ref 70–99)
Potassium: 3 mmol/L — ABNORMAL LOW (ref 3.5–5.1)
Sodium: 137 mmol/L (ref 135–145)
Total Bilirubin: 0.7 mg/dL (ref 0.0–1.2)
Total Protein: 6.9 g/dL (ref 6.5–8.1)

## 2023-11-16 LAB — CBC WITH DIFFERENTIAL/PLATELET
Abs Immature Granulocytes: 0.05 K/uL (ref 0.00–0.07)
Basophils Absolute: 0 K/uL (ref 0.0–0.1)
Basophils Relative: 0 %
Eosinophils Absolute: 0.1 K/uL (ref 0.0–0.5)
Eosinophils Relative: 1 %
HCT: 47.6 % — ABNORMAL HIGH (ref 36.0–46.0)
Hemoglobin: 15.2 g/dL — ABNORMAL HIGH (ref 12.0–15.0)
Immature Granulocytes: 1 %
Lymphocytes Relative: 27 %
Lymphs Abs: 2.5 K/uL (ref 0.7–4.0)
MCH: 28.8 pg (ref 26.0–34.0)
MCHC: 31.9 g/dL (ref 30.0–36.0)
MCV: 90.3 fL (ref 80.0–100.0)
Monocytes Absolute: 0.3 K/uL (ref 0.1–1.0)
Monocytes Relative: 3 %
Neutro Abs: 6.3 K/uL (ref 1.7–7.7)
Neutrophils Relative %: 68 %
Platelets: 227 K/uL (ref 150–400)
RBC: 5.27 MIL/uL — ABNORMAL HIGH (ref 3.87–5.11)
RDW: 13.9 % (ref 11.5–15.5)
WBC: 9.3 K/uL (ref 4.0–10.5)
nRBC: 0 % (ref 0.0–0.2)

## 2023-11-16 LAB — CBG MONITORING, ED: Glucose-Capillary: 181 mg/dL — ABNORMAL HIGH (ref 70–99)

## 2023-11-16 LAB — MAGNESIUM: Magnesium: 1.7 mg/dL (ref 1.7–2.4)

## 2023-11-16 LAB — TROPONIN I (HIGH SENSITIVITY)
Troponin I (High Sensitivity): 6 ng/L (ref <18)
Troponin I (High Sensitivity): 5 ng/L (ref <18)

## 2023-11-16 LAB — GLUCOSE, CAPILLARY: Glucose-Capillary: 101 mg/dL — ABNORMAL HIGH (ref 70–99)

## 2023-11-16 LAB — TSH: TSH: 6.601 u[IU]/mL — ABNORMAL HIGH (ref 0.350–4.500)

## 2023-11-16 MED ORDER — FAMOTIDINE IN NACL 20-0.9 MG/50ML-% IV SOLN: 20.0000 mg | Freq: Once | INTRAVENOUS | Status: DC | PRN

## 2023-11-16 MED ORDER — ACETAMINOPHEN 650 MG RE SUPP: 650.0000 mg | Freq: Four times a day (QID) | RECTAL | Status: DC | PRN

## 2023-11-16 MED ORDER — DIPHENHYDRAMINE HCL 50 MG/ML IJ SOLN: 50.0000 mg | Freq: Once | INTRAMUSCULAR | Status: DC | PRN

## 2023-11-16 MED ORDER — ACETAMINOPHEN 325 MG PO TABS
650.0000 mg | ORAL_TABLET | Freq: Once | ORAL | Status: AC
  Administered 2023-11-16: 650 mg via ORAL
  Filled 2023-11-16: qty 2

## 2023-11-16 MED ORDER — ONDANSETRON HCL 4 MG PO TABS: 4.0000 mg | ORAL_TABLET | Freq: Four times a day (QID) | ORAL | Status: DC | PRN

## 2023-11-16 MED ORDER — METHYLPREDNISOLONE SODIUM SUCC 125 MG IJ SOLR
125.0000 mg | Freq: Once | INTRAMUSCULAR | Status: AC | PRN
  Administered 2023-11-16: 125 mg via INTRAVENOUS

## 2023-11-16 MED ORDER — CETIRIZINE HCL 10 MG PO TABS: 5.0000 mg | ORAL_TABLET | Freq: Once | ORAL | Status: DC

## 2023-11-16 MED ORDER — POTASSIUM CHLORIDE IN NACL 20-0.9 MEQ/L-% IV SOLN
Freq: Once | INTRAVENOUS | Status: AC
  Filled 2023-11-16: qty 1000

## 2023-11-16 MED ORDER — POTASSIUM CHLORIDE 20 MEQ PO PACK
40.0000 meq | PACK | ORAL | Status: AC
  Administered 2023-11-16 – 2023-11-17 (×2): 40 meq via ORAL
  Filled 2023-11-16 (×2): qty 2

## 2023-11-16 MED ORDER — ACETAMINOPHEN 325 MG PO TABS: 650.0000 mg | ORAL_TABLET | Freq: Four times a day (QID) | ORAL | Status: DC | PRN

## 2023-11-16 MED ORDER — SODIUM CHLORIDE 0.9 % IV SOLN: INTRAVENOUS | Status: DC

## 2023-11-16 MED ORDER — CETIRIZINE HCL 10 MG/ML IV SOLN
5.0000 mg | Freq: Once | INTRAVENOUS | Status: AC
  Administered 2023-11-16: 5 mg via INTRAVENOUS
  Filled 2023-11-16: qty 1

## 2023-11-16 MED ORDER — SODIUM CHLORIDE 0.9 % IV SOLN: Freq: Once | INTRAVENOUS | Status: DC | PRN

## 2023-11-16 MED ORDER — EPINEPHRINE 0.3 MG/0.3ML IJ SOAJ: 0.3000 mg | Freq: Once | INTRAMUSCULAR | Status: DC | PRN

## 2023-11-16 MED ORDER — ALBUTEROL SULFATE HFA 108 (90 BASE) MCG/ACT IN AERS: 2.0000 | INHALATION_SPRAY | Freq: Once | RESPIRATORY_TRACT | Status: DC | PRN

## 2023-11-16 MED ORDER — POLYETHYLENE GLYCOL 3350 17 G PO PACK
17.0000 g | PACK | Freq: Every day | ORAL | Status: DC | PRN
Start: 2023-11-16 — End: 2023-11-17

## 2023-11-16 MED ORDER — ENOXAPARIN SODIUM 40 MG/0.4ML IJ SOSY
40.0000 mg | PREFILLED_SYRINGE | INTRAMUSCULAR | Status: DC
  Administered 2023-11-17: 40 mg via SUBCUTANEOUS
  Filled 2023-11-16: qty 0.4

## 2023-11-16 MED ORDER — ONDANSETRON HCL 4 MG/2ML IJ SOLN: 4.0000 mg | Freq: Four times a day (QID) | INTRAMUSCULAR | Status: DC | PRN

## 2023-11-16 MED ORDER — SODIUM CHLORIDE 0.9 % IV SOLN: INTRAVENOUS | Status: AC

## 2023-11-16 MED ORDER — SODIUM CHLORIDE 0.9 % IV SOLN
400.0000 mg | Freq: Once | INTRAVENOUS | Status: AC
  Administered 2023-11-16: 400 mg via INTRAVENOUS
  Filled 2023-11-16: qty 200

## 2023-11-16 NOTE — Assessment & Plan Note (Signed)
 Potassium 3. -Check magnesium

## 2023-11-16 NOTE — Assessment & Plan Note (Signed)
Diet controlled. - HgbA1c

## 2023-11-16 NOTE — Progress Notes (Signed)
 Hypersensitivity Reaction note  Date of event: 11/16/23 Time of event: 1354 Generic name of drug involved: Venofer  Name of provider notified of the hypersensitivity reaction: Hyndavi Kandala, MD Was agent that likely caused hypersensitivity reaction added to Allergies List within EMR? yes Chain of events including reaction signs/symptoms, treatment administered, and outcome (e.g., drug resumed; drug discontinued; sent to Emergency Department; etc.)    1354:  Patient's roommate hit the call light for patient because patient stated that she did not feel right.     1355:  Iron  stopped immediately.  NS bolus started.  Vitals obtained.  Patient noted to be hypotensive at 52/40.     1359:  Dr. Davonna at bedside. Solu-medrol  125 mg IV x one dose given per verbal order by Dr. Davonna.     1401:  Blood glucose checked.  Glucose= 101   1403:  Solu-medrol  given. Vitals obtained.  BP is 72/43. Patient more alert, but still c/o dizziness.   1409:  Vitals obtained.  BP 82/50.  Dizziness improving.   1416:  BP = 106/45.  Patient having a snack and Cola.  Dizziness is minimal.   1430:  BP= 145/51.  Patient evaluated by Pleasant Barefoot, PA-C.  Per PA, we will stop NS bolus and monitor patient for an additional 30 minutes.   1445: Patient ambulated to the bathroom.  Patient c/o slight dizziness while standing. BP= 150/60 and HR= 45   1527:  BP= 113/53.  Patient denies dizziness and states she is feeling well enough to drive.  Daughter has already been notified and is en route to drive patient home.   Dena GORMAN Daring, RN 11/16/2023 4:23 PM

## 2023-11-16 NOTE — ED Triage Notes (Signed)
 After iron  infusion, pt had a snack and needed to use the restroom. Pt had a syncopal episode in the restroom. Blood pressure after episode in 60's (systolic). Received solumedrol, benadryl  and normal saline from floor.

## 2023-11-16 NOTE — Assessment & Plan Note (Signed)
 Resenting with 2 syncopal episodes related to hypotension, blood pressure has since improved now 158/67. -Continue to hold antihypertensives HCTZ, lisinopril  and Norvasc  10 mg for now.

## 2023-11-16 NOTE — ED Provider Notes (Signed)
 [facility] Provider Note   CSN: [id number] Arrival date & time: 11/16/23  1554     Patient presents with: Loss of Consciousness   [name] is a 83 y.o. female.   HPI Patient arrives as a transfer from our cancer center due to fall, hypotension. I discussed the patient's case on arrival with the physician assistant from the cancer center, pertinent timeline, details included below.  She was at the cancer center for infusion.   Patient presents today for iron  infusion.  Patient is in satisfactory condition with no new complaints voiced.  Vital signs are stable.  IV placed in R arm.  IV flushed well with good blood return noted.  We will proceed with infusion per provider orders.     1354:  Patient's roommate hit the call light for patient because patient stated that she did not feel right.     1355:  Iron  stopped immediately.  NS bolus started.  Vitals obtained.  Patient noted to be hypotensive at 52/40.     1359:  Dr. Davonna at bedside. Solu-medrol  125 mg IV x one dose given per verbal order by Dr. Davonna.     1401:  Blood glucose checked.  Glucose= 101   1403:  Solu-medrol  given. Vitals obtained.  BP is 72/43. Patient more alert, but still c/o dizziness.   1409:  Vitals obtained.  BP 82/50.  Dizziness improving.   1416:  BP = 106/45.  Patient having a snack and Cola.  Dizziness is minimal.   1430:  BP= 145/51.  Patient evaluated by Pleasant Barefoot, PA-C.  Per PA, we will stop NS bolus and monitor patient for an additional 30 minutes.   1445: Patient ambulated to the bathroom.  Patient c/o slight dizziness while standing. BP= 150/60 and HR= 45      Prior to Admission medications   Medication Sig Start Date End Date Taking? Authorizing Provider  acetaminophen  (TYLENOL ) 500 MG tablet Take 500 mg by mouth daily as needed for headache.    [provider]  albuterol  (VENTOLIN  HFA) 108 (90 Base) MCG/ACT inhaler  Inhale 2 puffs into the lungs every 6 (six) hours as needed for wheezing or shortness of breath.    [provider]  Alcohol Swabs (DROPSAFE ALCOHOL PREP) 70 % PADS Apply topically. 10/13/22   [provider]  amLODipine  (NORVASC ) 10 MG tablet Take 1 tablet (10 mg total) by mouth daily. Patient taking differently: Take 10 mg by mouth daily. 03/15/20 11/09/23  Johnson Laymon HERO, PA-C  Ascorbic Acid (VITAMIN C) 100 MG tablet Take 100 mg by mouth daily.    [provider]  aspirin  EC 81 MG tablet 1 tablet Orally Once a week    [provider]  B Complex Vitamins (VITAMIN B COMPLEX) TABS See admin instructions.    [provider]  Blood Glucose Calibration (TRUE METRIX LEVEL 1) Low SOLN  03/19/22   [provider]  Blood Glucose Monitoring Suppl (TRUE METRIX METER) w/Device KIT USE AS DIRECTED; Duration: 90    [provider]  Cyanocobalamin (B-12 PO) Take 1 tablet by mouth daily.    [provider]  Famotidine -Ca Carb-Mag Hydrox (PEPCID  COMPLETE PO) Take 1 tablet by mouth as needed (indigestion).    [provider]  ferrous sulfate  325 (65 FE) MG EC tablet Take 1 tablet (325 mg total) by mouth daily with breakfast. 04/25/21   Barefoot Pleasant HERO, PA-C  hydrochlorothiazide  (HYDRODIURIL ) 25  MG tablet Take 25 mg by mouth daily. 04/22/11   [provider]  lisinopril  (PRINIVIL ,ZESTRIL ) 40 MG tablet Take 40 mg by mouth daily. 04/22/11   [provider]  loratadine  (CLARITIN ) 10 MG tablet Take 1 tablet (10 mg total) by mouth daily. 02/16/16   Singh, Prashant K, MD  Multiple Vitamins-Minerals (VITAMIN D3 COMPLETE) TABS See admin instructions.    [provider]  pantoprazole  (PROTONIX ) 40 MG tablet TAKE 1 TABLET ONE TIME DAILY AS NEEDED FOR  REFLUX; Duration: 90    [provider]  pravastatin  (PRAVACHOL ) 20 MG tablet Take 20 mg by mouth at bedtime.    [provider]  TRUE METRIX BLOOD  GLUCOSE TEST test strip  06/07/18   [provider]  TRUEplus Lancets 33G MISC  09/25/22   [provider]  VITAMIN D  PO Take 1 tablet by mouth daily.    [provider]    Allergies: Augmentin [amoxicillin-pot clavulanate], Amoxicillin, Clavulanic acid, Penicillin g, and Sulfa antibiotics    Review of Systems  Updated Vital Signs There were no vitals taken for this visit.  Physical Exam Vitals and nursing note reviewed.  Constitutional:      General: She is not in acute distress.    Appearance: She is well-developed.  HENT:     Head: Normocephalic and atraumatic.  Eyes:     Conjunctiva/sclera: Conjunctivae normal.  Cardiovascular:     Rate and Rhythm: Normal rate and regular rhythm.  Pulmonary:     Effort: Pulmonary effort is normal. No respiratory distress.     Breath sounds: Normal breath sounds. No stridor.  Abdominal:     General: There is no distension.  Skin:    General: Skin is warm and dry.  Neurological:     Mental Status: She is alert and oriented to person, place, and time.     Cranial Nerves: No cranial nerve deficit.  Psychiatric:        Mood and Affect: Mood normal.     (all labs ordered are listed, but only abnormal results are displayed) Labs Reviewed - No data to display  EKG: None  Radiology: No results found.   Procedures   Medications Ordered in the ED - No data to display                                  Medical Decision Making Adult presents with 4 episode of syncope.  Patient's initial hypotension likely vagal given her history, episode, here patient is awake, alert, speaking clearly, does have some pain in with concern for refractory hypotension suspicion for hypovolemia versus anemia, infection considered as well. However, the patient is afebrile, awake and alert. Patient's denial of chest pain for afterwards is somewhat reassuring for lower suspicion for ACS, arrhythmia.  Amount and/or Complexity of Data  Reviewed Independent Historian:     Details: Physician assistant from cancer center External Data Reviewed: notes. Labs: ordered. Decision-making details documented in ED Course. Radiology: ordered and independent interpretation performed. Decision-making details documented in ED Course. ECG/medicine tests: ordered and independent interpretation performed. Decision-making details documented in ED Course.  Risk Prescription drug management. Decision regarding hospitalization. Diagnosis or treatment significantly limited by social determinants of health.   8:13 PM Patient is mildly bradycardic, 40s no additional episode of syncope, with persistent mild bradycardia, the context of multiple episodes of syncope earlier, with new hypokalemia patient be admitted for monitoring, management  overnight.  No obvious etiology, some consideration of infusion reaction versus medication effect versus electrolyte abnormalities.  Patient is not on beta-blocker, is on a calcium channel blocker. Patient code by family members, with, discussed all findings thus far.    Final diagnoses:  Syncope and collapse  Hypokalemia    ED Discharge Orders     None          Garrick Charleston, MD 11/16/23 2014

## 2023-11-16 NOTE — Assessment & Plan Note (Addendum)
 2 syncopal episodes today, both times patient hypotensive, systolic 50s, 60s.  No GI losses, no cardiorespiratory symptoms. Confirms she is on lisinopril , Norvasc  and HCTZ, no change in these medications in years, she took just HCTZ this morning. Head and Cervical CT negative for acute abnormality.  Bradycardic, heart rate down to 45, EKGs over the past 2 years heart rate has ranged from 54-77.  -EKG today shows sinus bradycardia, short PR interval. -Differentials reaction to iron  infusion versus related to lower than usual bradycardic heart rate.   - Trend troponin -Echocardiogram -TSH - given, continue N/s 75cc/hr x 12hrs

## 2023-11-16 NOTE — Progress Notes (Addendum)
 Patient evaluated by me at 2:30 PM, after her initial episode of hypotension had been treated with IV Solu-Medrol  and NS bolus.  Upon my evaluation, patient was feeling improved, and blood pressure was up to 145/51.  She did still have some bradycardia (heart rate 45), but review of medical records showed that this is within her previously documented pulse range, and per cardiology note from April 2025, she has a known history of asymptomatic bradycardia.  Called back to patient room by nursing staff at 3:40 PM - Patient had apparently stood and walked to the bathroom by herself rather than calling for assistance.  Patient was witnessed by staff member to fall straight back and hit her head.  Patient was hypotensive and bradycardic (BP 64/52, HR 58).  By the time I arrived at room, patient was awake and able to answer questions appropriately, but lethargic.  She did complain of some pain at the back of her head.  NS bolus was restarted and patient was taken to ED for further evaluation and treatment.  Report provided by myself to ED physician (Dr. Garrick), who has accepted care of of patient.  **We will cancel patient's second dose of IV Venofer .  We will see how her labs look at follow-up.  If she does need any future IV iron , we will switch to alternative brand and give premedication.  Pleasant CHRISTELLA Barefoot, PA-C 11/16/23 4:17 PM

## 2023-11-16 NOTE — Patient Instructions (Signed)
 [facility] [place] - A DEPT OF [facility]  Discharge Instructions: Thank you for choosing [facility] to provide your oncology and hematology care.  If you have a lab appointment with the Cancer Center - please note that after April 8th, 2024, all labs will be drawn in the cancer center.  You do not have to check in or register with the main entrance as you have in the past but will complete your check-in in the cancer center.  Wear comfortable clothing and clothing appropriate for easy access to any Portacath or PICC line.   We strive to give you quality time with your provider. You may need to reschedule your appointment if you arrive late (15 or more minutes).  Arriving late affects you and other patients whose appointments are after yours.  Also, if you miss three or more appointments without notifying the office, you may be dismissed from the clinic at the provider's discretion.      For prescription refill requests, have your pharmacy contact our office and allow 72 hours for refills to be completed.    Today you received the following:  Venofer.    Iron Sucrose Injection What is this medication? IRON SUCROSE (EYE ern SOO krose) treats low levels of iron (iron deficiency anemia) in people with kidney disease. Iron is a mineral that plays an important role in making red blood cells, which carry oxygen from your lungs to the rest of your body. This medicine may be used for other purposes; ask your health care provider or pharmacist if you have questions. COMMON BRAND NAME(S): Venofer What should I tell my care team before I take this medication? They need to know if you have any of these conditions: Anemia not caused by low iron levels Heart disease High levels of iron in the blood Kidney disease Liver disease An unusual or allergic reaction to iron, other medications, foods, dyes, or preservatives Pregnant or trying to get  pregnant Breastfeeding How should I use this medication? This medication is infused into a vein. It is given by your care team in a hospital or clinic setting. Talk to your care team about the use of this medication in children. While it may be prescribed for children as young as 2 years for selected conditions, precautions do apply. Overdosage: If you think you have taken too much of this medicine contact a poison control center or emergency room at once. NOTE: This medicine is only for you. Do not share this medicine with others. What if I miss a dose? Keep appointments for follow-up doses. It is important not to miss your dose. Call your care team if you are unable to keep an appointment. What may interact with this medication? Do not take this medication with any of the following: Deferoxamine Dimercaprol Other iron products This medication may also interact with the following: Chloramphenicol Deferasirox This list may not describe all possible interactions. Give your health care provider a list of all the medicines, herbs, non-prescription drugs, or dietary supplements you use. Also tell them if you smoke, drink alcohol, or use illegal drugs. Some items may interact with your medicine. What should I watch for while using this medication? Visit your care team for regular checks on your progress. Tell your care team if your symptoms do not start to get better or if they get worse. You may need blood work done while you are taking this medication. You may need to eat  more foods that contain iron. Talk to your care team. Foods that contain iron include whole grains or cereals, dried fruits, beans, peas, leafy green vegetables, and organ meats (liver, kidney). What side effects may I notice from receiving this medication? Side effects that you should report to your care team as soon as possible: Allergic reactions--skin rash, itching, hives, swelling of the face, lips, tongue, or throat Low  blood pressure--dizziness, feeling faint or lightheaded, blurry vision Shortness of breath Side effects that usually do not require medical attention (report to your care team if they continue or are bothersome): Flushing Headache Joint pain Muscle pain Nausea Pain, redness, or irritation at injection site This list may not describe all possible side effects. Call your doctor for medical advice about side effects. You may report side effects to FDA at 1-800-FDA-1088. Where should I keep my medication? This medication is given in a hospital or clinic. It will not be stored at home. NOTE: This sheet is a summary. It may not cover all possible information. If you have questions about this medicine, talk to your doctor, pharmacist, or health care provider.  2024 Elsevier/Gold Standard (2022-11-19 00:00:00)   To help prevent nausea and vomiting after your treatment, we encourage you to take your nausea medication as directed.  BELOW ARE SYMPTOMS THAT SHOULD BE REPORTED IMMEDIATELY: *FEVER GREATER THAN 100.4 F (38 C) OR HIGHER *CHILLS OR SWEATING *NAUSEA AND VOMITING THAT IS NOT CONTROLLED WITH YOUR NAUSEA MEDICATION *UNUSUAL SHORTNESS OF BREATH *UNUSUAL BRUISING OR BLEEDING *URINARY PROBLEMS (pain or burning when urinating, or frequent urination) *BOWEL PROBLEMS (unusual diarrhea, constipation, pain near the anus) TENDERNESS IN MOUTH AND THROAT WITH OR WITHOUT PRESENCE OF ULCERS (sore throat, sores in mouth, or a toothache) UNUSUAL RASH, SWELLING OR PAIN  UNUSUAL VAGINAL DISCHARGE OR ITCHING   Items with * indicate a potential emergency and should be followed up as soon as possible or go to the Emergency Department if any problems should occur.  Please show the CHEMOTHERAPY ALERT CARD or IMMUNOTHERAPY ALERT CARD at check-in to the Emergency Department and triage nurse.  Should you have questions after your visit or need to cancel or reschedule your appointment, please contact [facility] CANCER  CT[facility] [place] - A DEPT OF [facility] [phone]  and follow the prompts.  Office hours are 8:00 a.m. to 4:30 p.m. Monday - Friday. Please note that voicemails left after 4:00 p.m. may not be returned until the following business day.  We are closed weekends and major holidays. You have access to a nurse at all times for urgent questions. Please call the main number to the clinic [phone] and follow the prompts.  For any non-urgent questions, you may also contact your provider using MyChart. We now offer e-Visits for anyone 24 and older to request care online for non-urgent symptoms. For details visit [email or web address].   Also download the MyChart app! Go to the app store, search "MyChart", open the app, select [place], and log in with your MyChart username and password.

## 2023-11-16 NOTE — Progress Notes (Signed)
 Patient presents today for iron  infusion.  Patient is in satisfactory condition with no new complaints voiced.  Vital signs are stable.  IV placed in R arm.  IV flushed well with good blood return noted.  We will proceed with infusion per provider orders.    1354:  Patient's roommate hit the call light for patient because patient stated that she did not feel right.    1355:  Iron  stopped immediately.  NS bolus started.  Vitals obtained.  Patient noted to be hypotensive at 52/40.    1359:  Dr. Davonna at bedside. Solu-medrol  125 mg IV x one dose given per verbal order by Dr. Davonna.    1401:  Blood glucose checked.  Glucose= 101  1403:  Solu-medrol  given. Vitals obtained.  BP is 72/43. Patient more alert, but still c/o dizziness.  1409:  Vitals obtained.  BP 82/50.  Dizziness improving.  1416:  BP = 106/45.  Patient having a snack and Cola.  Dizziness is minimal.  1430:  BP= 145/51.  Patient evaluated by Pleasant Barefoot, PA-C.  Per PA, we will stop NS bolus and monitor patient for an additional 30 minutes.  1445: Patient ambulated to the bathroom.  Patient c/o slight dizziness while standing. BP= 150/60 and HR= 45  1527:  BP= 113/53.  Patient denies dizziness and states she is feeling well enough to drive.  Daughter has already been notified and is en route to drive patient home.   1540:  Patient witnessed by Harlene Ina, RD falling to the floor.  Per Harlene, patient fell straight back and hit her head.  Vitals obtained.  BP= 64/52 and HR 58.    1541:  Rebekah Pennington PA-C at bedside.  New PIV started in left [facility]  IVF bolus restarted per PA.    1550:  Patient transferred to ED by 2 RN and PA.  Report called to [place] in the ED prior to transferring.

## 2023-11-16 NOTE — H&P (Signed)
 History and Physical    Stacey Riley FMW:[id number] DOB: 1941/02/23 DOA: 11/16/2023  PCP: [facility]   Patient coming from: Home  I have personally briefly reviewed patient's old medical records in [facility]  Chief Complaint: Syncope  HPI: Stacey Riley is a 83 y.o. female with medical history significant for diabetes mellitus, hypertension, MGUS. Patient was sent to the ED from cancer center after 2 witnessed syncopal episodes. Patient was at the cancer center today for iron  infusion (Venofer ).  Typically the infusion lasts for 2 hours, but today she may have been given a different dose that lasted 4 hours.  Reports she has always tolerated her iron  infusions.  No prior syncopal episodes.  She has had good oral intake, no vomiting or diarrhea.  No change in her blood pressure medications-has been taking same for years.  States she has only taking- HCTZ today- 4 AM this morning.  No chest pain or difficulty breathing.  Prior to today no dizziness. She reports blood pressures systolic in the 150s before infusion, during the infusion, she did not feel right, her blood pressure was 52/40-she was given a N/s bolus and IV Solu-Medrol  with improvement in blood pressure to 150/6, rate 45.  Patient got up to use the bathroom unassisted, and fell backwards hitting her head on the floor.  Vitals were rechecked and blood pressure was 64/52 with heart rate of 58.  IV fluids were restarted.  She was sent to the ED. Venofer  was added to her allergy list.  ED Course: Temperature 96.1.  Heart rate 47 initially, ranging from 46-57.  Blood pressure initially 109/58, improved to 158/67 now.  O2 sats greater than 93% on room air. Potassium 3. 500 mls given. Hospitalist to admit for syncopal events.  Review of Systems: As per HPI all other systems reviewed and negative.  Past Medical History:  Diagnosis Date   Anemia 03/14/2016   Arthritis    Asthma    Bradycardia    DM  (diabetes mellitus) (HCC)    GERD (gastroesophageal reflux disease)    High cholesterol    HTN (hypertension)     Past Surgical History:  Procedure Laterality Date   CHOLECYSTECTOMY  1990s   COLONOSCOPY  03/18/1999   Dr Rourk-int  hemorrhoids, pancolonic diverticula   COLONOSCOPY N/A 09/26/2016   Dr. Shaaron: Diverticulosis, 4 mm polyp removed from the descending colon which is a tubular adenoma.  No future colonoscopies unless new symptoms arise.   COLONOSCOPY WITH ESOPHAGOGASTRODUODENOSCOPY (EGD)  08/06/2011   Rourk: Schatzki ring, moderate sized hiatal hernia status post dilation for history of dysphagia. Pancolonic diverticula, single diminutive polyp at the base of the cecum removed (tubular adenoma). Next colonoscopy recommended for April 2018   ESOPHAGOGASTRODUODENOSCOPY  07/02/01   Dr Rourk-Schatzki's ring s/p 51F maloney dilation, otherwise normal/small hiatal hernia/   Linear erosion and ulceration in the proximal stomach/  The ulcerated lesion in the proximal stomach  benign.This may be a Ole lesion secondary to trauma to the mucosa, straddling the  diaphragmatic hiatus in the presence of a hiatal hernia    ESOPHAGOGASTRODUODENOSCOPY N/A 02/14/2016   Dr. Shaaron: Large hiatal hernia, Ole lesion likely explains bleeding.   MALONEY DILATION  08/06/2011   Procedure: AGAPITO DILATION;  Surgeon: Lamar CHRISTELLA Shaaron, MD;  Location: [facility];  Service: Endoscopy;  Laterality: N/A;   POLYPECTOMY  09/26/2016   Procedure: POLYPECTOMY;  Surgeon: Shaaron Lamar CHRISTELLA, MD;  Location: [facility];  Service: Endoscopy;;  descending colon   TUBAL LIGATION       reports that she has never smoked. She has never used smokeless tobacco. She reports that she does not drink alcohol and does not use drugs.  Allergies  Allergen Reactions   Amoxicillin Other (See Comments)    GI Bleeding   Augmentin [Amoxicillin-Pot Clavulanate] Nausea And Vomiting and Other (See Comments)    Hematemesis     Clavulanic Acid Nausea And Vomiting   Penicillin G Nausea And Vomiting        Venofer  [Iron  Sucrose] Other (See Comments)    Hypotension   Sulfa Antibiotics Swelling    Family History  Problem Relation Age of Onset   Aneurysm Daughter    Stroke Daughter    Diabetes Daughter    Hypertension Mother    Dementia Mother    Hypertension Father    Kidney cancer Brother    Cancer Brother        brain   Cancer Brother        bone   Stroke Brother    Hypertension Daughter    Hypertension Son    Hyperlipidemia Son    Colon cancer Neg Hx    Inflammatory bowel disease Neg Hx     Prior to Admission medications   Medication Sig Start Date End Date Taking? Authorizing Provider  acetaminophen  (TYLENOL ) 500 MG tablet Take 500 mg by mouth daily as needed for headache.    [provider]  albuterol  (VENTOLIN  HFA) 108 (90 Base) MCG/ACT inhaler Inhale 2 puffs into the lungs every 6 (six) hours as needed for wheezing or shortness of breath.    [provider]  Alcohol Swabs (DROPSAFE ALCOHOL PREP) 70 % PADS Apply topically. 10/13/22   [provider]  amLODipine  (NORVASC ) 10 MG tablet Take 1 tablet (10 mg total) by mouth daily. Patient taking differently: Take 10 mg by mouth daily. 03/15/20 11/09/23  Johnson Laymon HERO, PA-C  Ascorbic Acid (VITAMIN C) 100 MG tablet Take 100 mg by mouth daily.    [provider]  aspirin  EC 81 MG tablet 1 tablet Orally Once a week    [provider]  B Complex Vitamins (VITAMIN B COMPLEX) TABS See admin instructions.    [provider]  Blood Glucose Calibration (TRUE METRIX LEVEL 1) Low SOLN  03/19/22   [provider]  Blood Glucose Monitoring Suppl (TRUE METRIX METER) w/Device KIT USE AS DIRECTED; Duration: 90    [provider]  Cyanocobalamin (B-12 PO) Take 1 tablet by mouth daily.    [provider]  Famotidine -Ca Carb-Mag Hydrox (PEPCID  COMPLETE PO) Take 1 tablet by mouth as needed  (indigestion).    [provider]  ferrous sulfate  325 (65 FE) MG EC tablet Take 1 tablet (325 mg total) by mouth daily with breakfast. 04/25/21   Lamon Herter M, PA-C  hydrochlorothiazide  (HYDRODIURIL ) 25 MG tablet Take 25 mg by mouth daily. 04/22/11   [provider]  lisinopril  (PRINIVIL ,ZESTRIL ) 40 MG tablet Take 40 mg by mouth daily. 04/22/11   [provider]  loratadine  (CLARITIN ) 10 MG tablet Take 1 tablet (10 mg total) by mouth daily. 02/16/16   Singh, Prashant K, MD  Multiple Vitamins-Minerals (VITAMIN D3 COMPLETE) TABS See admin instructions.    [provider]  pantoprazole  (PROTONIX ) 40 MG tablet TAKE 1 TABLET ONE TIME DAILY AS NEEDED FOR  REFLUX; Duration: 90    [provider]  pravastatin  (PRAVACHOL ) 20 MG tablet  Take 20 mg by mouth at bedtime.    [provider]  TRUE METRIX BLOOD GLUCOSE TEST test strip  06/07/18   [provider]  TRUEplus Lancets 33G MISC  09/25/22   [provider]  VITAMIN D  PO Take 1 tablet by mouth daily.    [provider]    Physical Exam: Vitals:   11/16/23 1845 11/16/23 1900 11/16/23 1943 11/16/23 2005  BP:  (!) 151/63  (!) 148/62  Pulse: (!) 50 (!) 57  (!) 50  Resp: 16 20    Temp:   98 F (36.7 C)   TempSrc:      SpO2: 95% 95%  94%  Weight:      Height:        Constitutional: NAD, calm, comfortable Vitals:   11/16/23 1845 11/16/23 1900 11/16/23 1943 11/16/23 2005  BP:  (!) 151/63  (!) 148/62  Pulse: (!) 50 (!) 57  (!) 50  Resp: 16 20    Temp:   98 F (36.7 C)   TempSrc:      SpO2: 95% 95%  94%  Weight:      Height:       Eyes: PERRL, lids and conjunctivae normal ENMT: Mucous membranes are moist.  Neck: normal, supple, no masses, no thyromegaly Respiratory: clear to auscultation bilaterally, no wheezing, no crackles. Normal respiratory effort. No accessory muscle use.  Cardiovascular: Bradycardic, regular rate and rhythm, no murmurs / rubs / gallops.  No extremity edema.   Abdomen: no tenderness, no masses palpated. No hepatosplenomegaly. Bowel sounds positive.  Musculoskeletal: no clubbing / cyanosis. No joint deformity upper and lower extremities.   Skin: no rashes, lesions, ulcers. No induration Neurologic: Speech fluent, no facial asymmetry, moving all extremities spontaneously. Psychiatric: Normal judgment and insight. Alert and oriented x 3. Normal mood.   Labs on Admission: I have personally reviewed following labs and imaging studies  CBC: Recent Labs  Lab 11/16/23 1608  WBC 9.3  NEUTROABS 6.3  HGB 15.2*  HCT 47.6*  MCV 90.3  PLT 227   Basic Metabolic Panel: Recent Labs  Lab 11/16/23 1608  NA 137  K 3.0*  CL 103  CO2 22  GLUCOSE 192*  BUN 22  CREATININE 1.02*  CALCIUM 8.5*   GFR: Estimated Creatinine Clearance: 40.1 mL/min (A) (by C-G formula based on SCr of 1.02 mg/dL (H)). Liver Function Tests: Recent Labs  Lab 11/16/23 1608  AST 25  ALT 15  ALKPHOS 46  BILITOT 0.7  PROT 6.9  ALBUMIN 3.5   CBG: Recent Labs  Lab 11/16/23 1401 11/16/23 1640  GLUCAP 101* 181*   Urine analysis:    Component Value Date/Time   COLORURINE YELLOW 05/13/2018 1430   APPEARANCEUR HAZY (A) 05/13/2018 1430   LABSPEC 1.023 05/13/2018 1430   PHURINE 5.0 05/13/2018 1430   GLUCOSEU NEGATIVE 05/13/2018 1430   HGBUR SMALL (A) 05/13/2018 1430   BILIRUBINUR NEGATIVE 05/13/2018 1430   KETONESUR 5 (A) 05/13/2018 1430   PROTEINUR 30 (A) 05/13/2018 1430   NITRITE NEGATIVE 05/13/2018 1430   LEUKOCYTESUR NEGATIVE 05/13/2018 1430    Radiological Exams on Admission: CT Head Wo Contrast Result Date: 11/16/2023 CLINICAL DATA:  Blunt poly trauma as syncopal episode resulting in fall in restroom. EXAM: CT HEAD WITHOUT CONTRAST CT CERVICAL SPINE WITHOUT CONTRAST TECHNIQUE: Multidetector CT imaging of the head and cervical spine was performed following the standard protocol without intravenous contrast. Multiplanar CT image  reconstructions of the cervical spine were also generated. RADIATION DOSE  REDUCTION: This exam was performed according to the departmental dose-optimization program which includes automated exposure control, adjustment of the mA and/or kV according to patient size and/or use of iterative reconstruction technique. COMPARISON:  Head CT 02/21/2016 FINDINGS: CT HEAD FINDINGS Brain: Ventricles, cisterns and other CSF spaces are normal. There is no mass, mass effect, shift of midline structures or acute hemorrhage. No evidence of acute infarction Vascular: No hyperdense vessel or unexpected calcification. Skull: Normal. Negative for fracture or focal lesion. Sinuses/Orbits: No acute finding. Other: None. CT CERVICAL SPINE FINDINGS Alignment: No posttraumatic subluxation. 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 Vertebral body heights are maintained. No acute findings involving the atlantoaxial articulation. Mild right-sided neural foraminal narrowing at the C3-4 level with minimal left-sided neural foraminal narrowing at the C4-5 level. No acute fracture. Soft tissues and spinal canal: Prevertebral soft tissues are normal. No evidence of canal stenosis. Disc levels: Moderate disc space narrowing at the C5-6 and C6-7 levels. Upper chest: No acute findings. Other: None. IMPRESSION: 1. No acute brain injury. 2. No acute cervical spine injury. 3. 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 Electronically Signed   By: Toribio Agreste M.D.   On: 11/16/2023 18:40   CT Cervical Spine Wo Contrast Result Date: 11/16/2023 CLINICAL DATA:  Blunt poly trauma as syncopal episode resulting in fall in restroom. EXAM: CT HEAD WITHOUT CONTRAST CT CERVICAL SPINE WITHOUT CONTRAST TECHNIQUE: Multidetector CT imaging of the head and  cervical spine was performed following the standard protocol without intravenous contrast. Multiplanar CT image reconstructions of the cervical spine were also generated. RADIATION DOSE REDUCTION: This exam was performed according to the departmental dose-optimization program which includes automated exposure control, adjustment of the mA and/or kV according to patient size and/or use of iterative reconstruction technique. COMPARISON:  Head CT 02/21/2016 FINDINGS: CT HEAD FINDINGS Brain: Ventricles, cisterns and other CSF spaces are normal. There is no mass, mass effect, shift of midline structures or acute hemorrhage. No evidence of acute infarction Vascular: No hyperdense vessel or unexpected calcification. Skull: Normal. Negative for fracture or focal lesion. Sinuses/Orbits: No acute finding. Other: None. CT CERVICAL SPINE FINDINGS Alignment: No posttraumatic subluxation. 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 Vertebral body heights are maintained. No acute findings involving the atlantoaxial articulation. Mild right-sided neural foraminal narrowing at the C3-4 level with minimal left-sided neural foraminal narrowing at the C4-5 level. No acute fracture. Soft tissues and spinal canal: Prevertebral soft tissues are normal. No evidence of canal stenosis. Disc levels: Moderate disc space narrowing at the C5-6 and C6-7 levels. Upper chest: No acute findings. Other: None. IMPRESSION: 1. No acute brain injury. 2. No acute cervical spine injury. 3. 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 Electronically Signed   By: Toribio Agreste M.D.   On: 11/16/2023 18:40   DG Chest Port 1 View Result Date: 11/16/2023 CLINICAL DATA:  Syncopal episode EXAM: PORTABLE CHEST 1 VIEW COMPARISON:  02/12/2023 FINDINGS: The heart  size and mediastinal contours are within normal limits. Aortic atherosclerosis. Large hiatal hernia. Both lungs are clear. The visualized skeletal structures are unremarkable. IMPRESSION: No active disease. Large hiatal hernia. Electronically Signed  By: Luke Bun M.D.   On: 11/16/2023 17:08   EKG: Independently reviewed.  Sinus bradycardia, rate 45, QTc 553.  PACs.  Short PR interval.  Assessment/Plan Principal Problem:   Syncope Active Problems:   Bradycardia   Diabetes (HCC)   Essential hypertension   MGUS (monoclonal gammopathy of unknown significance)   Hypokalemia   Assessment and Plan: * Syncope 2 syncopal episodes today, both times patient hypotensive, systolic 50s, 60s.  No GI losses, no cardiorespiratory symptoms. Confirms she is on lisinopril , Norvasc  and HCTZ, no change in these medications in years, she took just HCTZ this morning. Head and Cervical CT negative for acute abnormality.  Bradycardic, heart rate down to 45, EKGs over the past 2 years heart rate has ranged from 54-77.  -EKG today shows sinus bradycardia, short PR interval. -Differentials reaction to iron  infusion versus related to lower than usual bradycardic heart rate.   - Trend troponin -Echocardiogram -TSH - given, continue N/s 75cc/hr x 12hrs  Hypokalemia Potassium 3.  Check magnesium.  Essential hypertension Resenting with 2 syncopal episodes related to hypotension, blood pressure has since improved now 158/67. -Continue to hold antihypertensives HCTZ, lisinopril  and Norvasc  10 mg for now.  Diabetes (HCC) Diet controlled. - HgbA1c   DVT prophylaxis: Lovenox  Code Status: FULL code- confirmed with patient, son and daughter at bedside. Family Communication: Son- Ellaree and daughter- Amy at beside Disposition Plan: ~ 2 days Consults called: Cardiology Admission status:  Obs Tele    Author: Tully FORBES Carwin, MD 11/16/2023 9:41 PM  For on call review [email or web address].

## 2023-11-17 ENCOUNTER — Observation Stay (HOSPITAL_BASED_OUTPATIENT_CLINIC_OR_DEPARTMENT_OTHER)

## 2023-11-17 ENCOUNTER — Encounter (HOSPITAL_COMMUNITY): Payer: Self-pay | Admitting: Internal Medicine

## 2023-11-17 DIAGNOSIS — D472 Monoclonal gammopathy: Secondary | ICD-10-CM

## 2023-11-17 DIAGNOSIS — E1169 Type 2 diabetes mellitus with other specified complication: Secondary | ICD-10-CM

## 2023-11-17 DIAGNOSIS — R001 Bradycardia, unspecified: Secondary | ICD-10-CM | POA: Diagnosis not present

## 2023-11-17 DIAGNOSIS — I10 Essential (primary) hypertension: Secondary | ICD-10-CM | POA: Diagnosis not present

## 2023-11-17 DIAGNOSIS — E876 Hypokalemia: Secondary | ICD-10-CM | POA: Diagnosis not present

## 2023-11-17 DIAGNOSIS — R55 Syncope and collapse: Secondary | ICD-10-CM | POA: Diagnosis not present

## 2023-11-17 LAB — BASIC METABOLIC PANEL WITH GFR
Anion gap: 9 (ref 5–15)
BUN: 18 mg/dL (ref 8–23)
CO2: 21 mmol/L — ABNORMAL LOW (ref 22–32)
Calcium: 8.5 mg/dL — ABNORMAL LOW (ref 8.9–10.3)
Chloride: 110 mmol/L (ref 98–111)
Creatinine, Ser: 0.86 mg/dL (ref 0.44–1.00)
GFR, Estimated: >60 mL/min (ref >60)
Glucose, Bld: 189 mg/dL — ABNORMAL HIGH (ref 70–99)
Potassium: 5 mmol/L (ref 3.5–5.1)
Sodium: 140 mmol/L (ref 135–145)

## 2023-11-17 LAB — ECHOCARDIOGRAM COMPLETE
Area-P 1/2: 4.99 cm2
Height: 64 in
S' Lateral: 2.6 cm
Weight: 2656.1 [oz_av]

## 2023-11-17 MED ORDER — LEVOTHYROXINE SODIUM 25 MCG PO TABS
25.0000 ug | ORAL_TABLET | Freq: Every day | ORAL | Status: DC
  Administered 2023-11-17: 25 ug via ORAL

## 2023-11-17 MED ORDER — LEVOTHYROXINE SODIUM 25 MCG PO TABS: 25.0000 ug | ORAL_TABLET | Freq: Every day | ORAL | 2 refills | Status: AC

## 2023-11-17 MED ORDER — POLYSACCHARIDE IRON COMPLEX 150 MG PO CAPS: 150.0000 mg | ORAL_CAPSULE | Freq: Every day | ORAL | 3 refills | Status: AC

## 2023-11-17 NOTE — Plan of Care (Signed)
  Problem: Activity: Goal: Risk for activity intolerance will decrease Outcome: Adequate for Discharge   Problem: Pain Managment: Goal: General experience of comfort will improve and/or be controlled Outcome: Adequate for Discharge

## 2023-11-17 NOTE — Discharge Summary (Signed)
 Physician Discharge Summary   Patient: Stacey Riley MRN: [id number] DOB: 1941-02-27  Admit date:     11/16/2023  Discharge date: 11/17/23  Discharge Physician: Eric Nunnery   PCP: [facility]   Recommendations at discharge:  Repeat CBC to follow hemoglobin trend/stability Repeat basic metabolic panel to follow ultralights renal function In 12 weeks repeat thyroid  panel with plans to further adjust Synthroid  regimen as required.   Discharge Diagnoses: Principal Problem:   Syncope Active Problems:   Bradycardia   Diabetes (HCC)   Essential hypertension   MGUS (monoclonal gammopathy of unknown significance)   Hypokalemia  Brief Hospital admission narrative: Stacey Riley is a 83 y.o. female with medical history significant for diabetes mellitus, hypertension, MGUS. Patient was sent to the ED from cancer center after 2 witnessed syncopal episodes. Patient was at the cancer center today for iron  infusion (Venofer ).  Typically the infusion lasts for 2 hours, but today she may have been given a different dose that lasted 4 hours.  Reports she has always tolerated her iron  infusions.  No prior syncopal episodes.  She has had good oral intake, no vomiting or diarrhea.  No change in her blood pressure medications-has been taking same for years.  States she has only taking- HCTZ today- 4 AM this morning.  No chest pain or difficulty breathing.  Prior to today no dizziness. She reports blood pressures systolic in the 150s before infusion, during the infusion, she did not feel right, her blood pressure was 52/40-she was given a N/s bolus and IV Solu-Medrol  with improvement in blood pressure to 150/6, rate 45.  Patient got up to use the bathroom unassisted, and fell backwards hitting her head on the floor.  Vitals were rechecked and blood pressure was 64/52 with heart rate of 58.  IV fluids were restarted.  She was sent to the ED. Venofer  was added to her allergy list.    ED Course: Temperature 96.1.  Heart rate 47 initially, ranging from 46-57.  Blood pressure initially 109/58, improved to 158/67 now.  O2 sats greater than 93% on room air. Potassium 3. 500 mls given. Hospitalist to admit for syncopal events.  Assessment and Plan: * Syncope - Most likely associated to adverse reaction to IV iron  infusion - Patient was also found to be with hypothyroidism - Continue avoiding IV iron  infusions unless imminent. - Safe to continue oral supplementation - Continue treatment with Synthroid  as initiated with follow-up in 12 weeks with intentions to adjust dosage as required.  Hypokalemia -Magnesium within normal limit - Potassium repleted and is stable at discharge. - Repeat basic metabolic panel to follow electrolytes trend/stability at follow-up visit.  Essential hypertension -Stable - Norvasc  has been discontinued at discharge - Continue treatment with lisinopril  and HCTZ - Heart healthy/low-sodium diet discussed with patient.  Diabetes (HCC) -Continue diet control - Follow CBGs fluctuation - Maintain adequate hydration.  History of GERD - Continue lifestyle changes - Continue treatment with Pepcid  - Follow-up with GI as needed.   Consultants: None Procedures performed: See below for x-ray report. Disposition: Home Diet recommendation: Heart healthy/modified [place] diet.  DISCHARGE MEDICATION: Allergies as of 11/17/2023       Reactions   Amoxicillin Other (See Comments)   GI Bleeding   Augmentin [amoxicillin-pot Clavulanate] Nausea And Vomiting, Other (See Comments)   Hematemesis   Clavulanic Acid Nausea And Vomiting   Penicillin G Nausea And Vomiting      Venofer  [iron  Sucrose] Other (See Comments)  Hypotension; Medicated with solumedrol and IVF bolus; See progress note for 11/16/2023   Sulfa Antibiotics Swelling        Medication List     STOP taking these medications    amLODipine  10 MG tablet Commonly known as: NORVASC     ferrous sulfate  325 (65 FE) MG EC tablet       TAKE these medications    acetaminophen  500 MG tablet Commonly known as: TYLENOL  Take 500 mg by mouth daily as needed for headache.   albuterol  108 (90 Base) MCG/ACT inhaler Commonly known as: VENTOLIN  HFA Inhale 2 puffs into the lungs every 6 (six) hours as needed for wheezing or shortness of breath.   aspirin  EC 81 MG tablet 1 tablet Orally Once a week   B-12 PO Take 1 tablet by mouth daily.   DropSafe Alcohol Prep 70 % Pads Apply topically.   hydrochlorothiazide  25 MG tablet Commonly known as: HYDRODIURIL  Take 25 mg by mouth daily.   iron  polysaccharides 150 MG capsule Commonly known as: NIFEREX Take 1 capsule (150 mg total) by mouth daily.   levothyroxine  25 MCG tablet Commonly known as: SYNTHROID  Take 1 tablet (25 mcg total) by mouth daily at 6 (six) AM. Start taking on: November 18, 2023   lisinopril  40 MG tablet Commonly known as: ZESTRIL  Take 40 mg by mouth daily.   loratadine  10 MG tablet Commonly known as: CLARITIN  Take 1 tablet (10 mg total) by mouth daily.   PEPCID  COMPLETE PO Take 1 tablet by mouth as needed (indigestion).   pravastatin  20 MG tablet Commonly known as: PRAVACHOL  Take 20 mg by mouth at bedtime.   True Metrix Blood Glucose Test test strip Generic drug: glucose blood   True Metrix Level 1 Low Soln   True Metrix Meter w/Device Kit USE AS DIRECTED; Duration: 90   TRUEplus Lancets 33G Misc   vitamin C 100 MG tablet Take 100 mg by mouth daily.   VITAMIN D  PO Take 1 tablet by mouth daily.   Vitamin D3 Complete Tabs Take 1 capsule by mouth daily.        Discharge Exam: Filed Weights   11/16/23 1559 11/16/23 2200  Weight: 72.6 kg 75.3 kg   General exam: Alert, awake, oriented x 3; no orthostatic changes appreciated.  Feeling ready to go home. Respiratory system: Clear to auscultation. Respiratory effort normal. Cardiovascular system:RRR. No murmurs, rubs,  gallops. Gastrointestinal system: Abdomen is nondistended, soft and nontender. No organomegaly or masses felt. Normal bowel sounds heard. Central nervous system: Alert and oriented. No focal neurological deficits. Extremities: No C/C/E, +pedal pulses Skin: No rashes, lesions or ulcers Psychiatry: Judgement and insight appear normal. Mood & affect appropriate.    Condition at discharge: Stable and improved.  The results of significant diagnostics from this hospitalization (including imaging, microbiology, ancillary and laboratory) are listed below for reference.   Imaging Studies: CT Head Wo Contrast Result Date: 11/16/2023 CLINICAL DATA:  Blunt poly trauma as syncopal episode resulting in fall in restroom. EXAM: CT HEAD WITHOUT CONTRAST CT CERVICAL SPINE WITHOUT CONTRAST TECHNIQUE: Multidetector CT imaging of the head and cervical spine was performed following the standard protocol without intravenous contrast. Multiplanar CT image reconstructions of the cervical spine were also generated. RADIATION DOSE REDUCTION: This exam was performed according to the departmental dose-optimization program which includes automated exposure control, adjustment of the mA and/or kV according to patient size and/or use of iterative reconstruction technique. COMPARISON:  Head CT 02/21/2016 FINDINGS: CT HEAD FINDINGS Brain: Ventricles, cisterns  and other CSF spaces are normal. There is no mass, mass effect, shift of midline structures or acute hemorrhage. No evidence of acute infarction Vascular: No hyperdense vessel or unexpected calcification. Skull: Normal. Negative for fracture or focal lesion. Sinuses/Orbits: No acute finding. Other: None. CT CERVICAL SPINE FINDINGS Alignment: No posttraumatic subluxation. 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 Vertebral body heights are maintained. No acute findings involving the  atlantoaxial articulation. Mild right-sided neural foraminal narrowing at the C3-4 level with minimal left-sided neural foraminal narrowing at the C4-5 level. No acute fracture. Soft tissues and spinal canal: Prevertebral soft tissues are normal. No evidence of canal stenosis. Disc levels: Moderate disc space narrowing at the C5-6 and C6-7 levels. Upper chest: No acute findings. Other: None. IMPRESSION: 1. No acute brain injury. 2. No acute cervical spine injury. 3. 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 Electronically Signed   By: Toribio Agreste M.D.   On: 11/16/2023 18:40   CT Cervical Spine Wo Contrast Result Date: 11/16/2023 CLINICAL DATA:  Blunt poly trauma as syncopal episode resulting in fall in restroom. EXAM: CT HEAD WITHOUT CONTRAST CT CERVICAL SPINE WITHOUT CONTRAST TECHNIQUE: Multidetector CT imaging of the head and cervical spine was performed following the standard protocol without intravenous contrast. Multiplanar CT image reconstructions of the cervical spine were also generated. RADIATION DOSE REDUCTION: This exam was performed according to the departmental dose-optimization program which includes automated exposure control, adjustment of the mA and/or kV according to patient size and/or use of iterative reconstruction technique. COMPARISON:  Head CT 02/21/2016 FINDINGS: CT HEAD FINDINGS Brain: Ventricles, cisterns and other CSF spaces are normal. There is no mass, mass effect, shift of midline structures or acute hemorrhage. No evidence of acute infarction Vascular: No hyperdense vessel or unexpected calcification. Skull: Normal. Negative for fracture or focal lesion. Sinuses/Orbits: No acute finding. Other: None. CT CERVICAL SPINE FINDINGS Alignment: No posttraumatic subluxation. Skull base and vertebrae: Mild to moderate spondylosis of the cervical spine to include uncovertebral  joint spurring and facet arthropathy. Vertebral body heights are maintained. No acute findings involving the atlantoaxial articulation. Mild right-sided neural foraminal narrowing at the C3-4 level with minimal left-sided neural foraminal narrowing at the C4-5 level. No acute fracture. Soft tissues and spinal canal: Prevertebral soft tissues are normal. No evidence of canal stenosis. Disc levels: Moderate disc space narrowing at the C5-6 and C6-7 levels. Upper chest: No acute findings. Other: None. IMPRESSION: 1. No acute brain injury. 2. No acute cervical spine injury. 3. 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 Electronically Signed   By: Toribio Agreste M.D.   On: 11/16/2023 18:40   DG Chest Port 1 View Result Date: 11/16/2023 CLINICAL DATA:  Syncopal episode EXAM: PORTABLE CHEST 1 VIEW COMPARISON:  02/12/2023 FINDINGS: The heart size and mediastinal contours are within normal limits. Aortic atherosclerosis. Large hiatal hernia. Both lungs are clear. The visualized skeletal structures are unremarkable. IMPRESSION: No active disease. Large hiatal hernia. Electronically Signed   By: Luke Bun M.D.   On: 11/16/2023 17:08   DG Bone Survey Met Result Date: 11/06/2023 CLINICAL DATA:  MUGS, annual skeletal survey. EXAM: METASTATIC BONE SURVEY COMPARISON:  Radiograph October 31, 2022 FINDINGS: Similar lucent lesions in the left mid  femoral shaft. Similar lucencies with sclerotic rim in the left humeral head. S-shaped curvature of the spine. Degenerative changes bilateral hips. Degenerative change of the spine. Degenerative changes bilateral shoulders. Aortic atherosclerosis.  Cholecystectomy clips. IMPRESSION: Similar lucent lesions in the left mid femoral shaft and left humeral head. Electronically Signed   By: Reyes Holder M.D.   On: 11/06/2023 13:04    Microbiology: Results for orders placed or  performed during the hospital encounter of 02/12/23  Resp panel by RT-PCR (RSV, Flu A&B, Covid) Anterior Nasal Swab     Status: None   Collection Time: 02/12/23  8:11 AM   Specimen: Anterior Nasal Swab  Result Value Ref Range Status   SARS Coronavirus 2 by RT PCR NEGATIVE NEGATIVE Final    Comment: (NOTE) SARS-CoV-2 target nucleic acids are NOT DETECTED.  The SARS-CoV-2 RNA is generally detectable in upper respiratory specimens during the acute phase of infection. The lowest concentration of SARS-CoV-2 viral copies this assay can detect is 138 copies/mL. A negative result does not preclude SARS-Cov-2 infection and should not be used as the sole basis for treatment or other patient management decisions. A negative result may occur with  improper specimen collection/handling, submission of specimen other than nasopharyngeal swab, presence of viral mutation(s) within the areas targeted by this assay, and inadequate number of viral copies(<138 copies/mL). A negative result must be combined with clinical observations, patient history, and epidemiological information. The expected result is Negative.  Fact Sheet for Patients:  BloggerCourse.com  Fact Sheet for Healthcare Providers:  SeriousBroker.it  This test is no t yet approved or cleared by the [place]  FDA and  has been authorized for detection and/or diagnosis of SARS-CoV-2 by FDA under an Emergency Use Authorization (EUA). This EUA will remain  in effect (meaning this test can be used) for the duration of the COVID-19 declaration under Section 564(b)(1) of the Act, 21 U.S.C.section 360bbb-3(b)(1), unless the authorization is terminated  or revoked sooner.       Influenza A by PCR NEGATIVE NEGATIVE Final   Influenza B by PCR NEGATIVE NEGATIVE Final    Comment: (NOTE) The Xpert Xpress SARS-CoV-2/FLU/RSV plus assay is intended as an aid in the diagnosis of influenza from  Nasopharyngeal swab specimens and should not be used as a sole basis for treatment. Nasal washings and aspirates are unacceptable for Xpert Xpress SARS-CoV-2/FLU/RSV testing.  Fact Sheet for Patients: BloggerCourse.com  Fact Sheet for Healthcare Providers: SeriousBroker.it  This test is not yet approved or cleared by the [place]  FDA and has been authorized for detection and/or diagnosis of SARS-CoV-2 by FDA under an Emergency Use Authorization (EUA). This EUA will remain in effect (meaning this test can be used) for the duration of the COVID-19 declaration under Section 564(b)(1) of the Act, 21 U.S.C. section 360bbb-3(b)(1), unless the authorization is terminated or revoked.     Resp Syncytial Virus by PCR NEGATIVE NEGATIVE Final    Comment: (NOTE) Fact Sheet for Patients: BloggerCourse.com  Fact Sheet for Healthcare Providers: SeriousBroker.it  This test is not yet approved or cleared by the [place]  FDA and has been authorized for detection and/or diagnosis of SARS-CoV-2 by FDA under an Emergency Use Authorization (EUA). This EUA will remain in effect (meaning this test can be used) for the duration of the COVID-19 declaration under Section 564(b)(1) of the Act, 21 U.S.C. section 360bbb-3(b)(1), unless the authorization is terminated or revoked.  Performed at [facility], [address] [place], [place] [address]  Labs: CBC: Recent Labs  Lab 11/16/23 1608  WBC 9.3  NEUTROABS 6.3  HGB 15.2*  HCT 47.6*  MCV 90.3  PLT 227   Basic Metabolic Panel: Recent Labs  Lab 11/16/23 1608 11/17/23 0438  NA 137 140  K 3.0* 5.0  CL 103 110  CO2 22 21*  GLUCOSE 192* 189*  BUN 22 18  CREATININE 1.02* 0.86  CALCIUM 8.5* 8.5*  MG 1.7  --    Liver Function Tests: Recent Labs  Lab 11/16/23 1608  AST 25  ALT 15  ALKPHOS 46  BILITOT 0.7  PROT 6.9   ALBUMIN 3.5   CBG: Recent Labs  Lab 11/16/23 1401 11/16/23 1640  GLUCAP 101* 181*    Discharge time spent:  35 minutes.  Signed: Eric Nunnery, MD [facility] 11/17/2023

## 2023-11-17 NOTE — Progress Notes (Signed)
 Mobility Specialist Progress Note:    11/17/23 0900  Therapy Vitals  Patient Position (if appropriate) Orthostatic Vitals  Orthostatic Lying   BP- Lying 144/54  Pulse- Lying 61  Orthostatic Sitting  BP- Sitting 144/46  Pulse- Sitting 84  Orthostatic Standing at 0 minutes  BP- Standing at 0 minutes 166/59  Pulse- Standing at 0 minutes 87  Orthostatic Standing at 3 minutes  BP- Standing at 3 minutes 177/64  Pulse- Standing at 3 minutes 95  Oxygen Therapy  SpO2 99 %  O2 Device Room Air  Mobility  Activity Dangled on edge of bed;Ambulated with assistance;Stood at bedside  Level of Assistance Standby assist, set-up cues, supervision of patient - no hands on  Assistive Device None  Distance Ambulated (ft) 30 ft  Range of Motion/Exercises Active;All extremities  Activity Response Tolerated well  Mobility Referral Yes  Mobility visit 1 Mobility  Mobility Specialist Start Time (ACUTE ONLY) 0910  Mobility Specialist Stop Time (ACUTE ONLY) [id number]  Mobility Specialist Time Calculation (min) (ACUTE ONLY) 28 min   Pt received in in bed, agreeable to mobility and orthostatic vitals. Required supervision to stand and ambulate with no AD. Tolerated well, pt denies any dizziness or other symptoms with positional changes. Returned supine, alarm on. All needs met.   Sherrilee Ditty Mobility Specialist Please contact via Special educational needs teacher or  Rehab office at [phone]

## 2023-11-17 NOTE — Plan of Care (Signed)
  Problem: Education: Goal: Knowledge of General Education information will improve Description: Including pain rating scale, medication(s)/side effects and non-pharmacologic comfort measures 11/17/2023 1713 by Mathews Norleen POUR, RN Outcome: Adequate for Discharge 11/17/2023 0855 by Mathews Norleen POUR, RN Outcome: Progressing   Problem: Health Behavior/Discharge Planning: Goal: Ability to manage health-related needs will improve 11/17/2023 1713 by Mathews Norleen POUR, RN Outcome: Adequate for Discharge 11/17/2023 0855 by Mathews Norleen POUR, RN Outcome: Progressing   Problem: Clinical Measurements: Goal: Ability to maintain clinical measurements within normal limits will improve 11/17/2023 1713 by Mathews Norleen POUR, RN Outcome: Adequate for Discharge 11/17/2023 0855 by Mathews Norleen POUR, RN Outcome: Progressing Goal: Will remain free from infection 11/17/2023 1713 by Mathews Norleen POUR, RN Outcome: Adequate for Discharge 11/17/2023 0855 by Mathews Norleen POUR, RN Outcome: Progressing Goal: Diagnostic test results will improve 11/17/2023 1713 by Mathews Norleen POUR, RN Outcome: Adequate for Discharge 11/17/2023 0855 by Mathews Norleen POUR, RN Outcome: Progressing Goal: Respiratory complications will improve 11/17/2023 1713 by Mathews Norleen POUR, RN Outcome: Adequate for Discharge 11/17/2023 0855 by Mathews Norleen POUR, RN Outcome: Progressing Goal: Cardiovascular complication will be avoided 11/17/2023 1713 by Mathews Norleen POUR, RN Outcome: Adequate for Discharge 11/17/2023 0855 by Mathews Norleen POUR, RN Outcome: Progressing   Problem: Activity: Goal: Risk for activity intolerance will decrease 11/17/2023 1713 by Mathews Norleen POUR, RN Outcome: Adequate for Discharge 11/17/2023 [phone] by Mathews Norleen POUR, RN Outcome: Progressing   Problem: Nutrition: Goal: Adequate nutrition will be maintained 11/17/2023 1713 by Mathews Norleen POUR, RN Outcome: Adequate for Discharge 11/17/2023 0855 by Mathews Norleen POUR, RN Outcome: Progressing   Problem: Coping: Goal: Level of anxiety will  decrease 11/17/2023 1713 by Mathews Norleen POUR, RN Outcome: Adequate for Discharge 11/17/2023 0855 by Mathews Norleen POUR, RN Outcome: Progressing   Problem: Elimination: Goal: Will not experience complications related to bowel motility 11/17/2023 1713 by Mathews Norleen POUR, RN Outcome: Adequate for Discharge 11/17/2023 0855 by Mathews Norleen POUR, RN Outcome: Progressing Goal: Will not experience complications related to urinary retention 11/17/2023 1713 by Mathews Norleen POUR, RN Outcome: Adequate for Discharge 11/17/2023 0855 by Mathews Norleen POUR, RN Outcome: Progressing   Problem: Pain Managment: Goal: General experience of comfort will improve and/or be controlled 11/17/2023 1713 by Mathews Norleen POUR, RN Outcome: Adequate for Discharge 11/17/2023 0855 by Mathews Norleen POUR, RN Outcome: Progressing   Problem: Safety: Goal: Ability to remain free from injury will improve 11/17/2023 1713 by Mathews Norleen POUR, RN Outcome: Adequate for Discharge 11/17/2023 0855 by Mathews Norleen POUR, RN Outcome: Progressing   Problem: Skin Integrity: Goal: Risk for impaired skin integrity will decrease 11/17/2023 1713 by Mathews Norleen POUR, RN Outcome: Adequate for Discharge 11/17/2023 0855 by Mathews Norleen POUR, RN Outcome: Progressing

## 2023-11-17 NOTE — Plan of Care (Signed)

## 2023-11-17 NOTE — TOC CM/SW Note (Signed)
 Transition of Care [facility]) - Inpatient Brief Assessment   Patient Details  Name: Stacey Riley MRN: [id number] Date of Birth: 1940-07-02  Transition of Care [facility]) CM/SW Contact:    Lucie Lunger, LCSWA Phone Number: 11/17/2023, 9:34 AM   Clinical Narrative: Transition of Care Department [facility]) has reviewed patient and no TOC needs have been identified at this time. We will continue to monitor patient advancement through interdiciplinary progression rounds. If new patient transition needs arise, please place a TOC consult.  Transition of Care Asessment: Insurance and Status: Insurance coverage has been reviewed Patient has primary care physician: Yes Home environment has been reviewed: From home Prior level of function:: Independent Prior/Current Home Services: No current home services Social Drivers of Health Review: SDOH reviewed no interventions necessary Readmission risk has been reviewed: Yes Transition of care needs: no transition of care needs at this time

## 2023-11-18 LAB — HEMOGLOBIN A1C
Hgb A1c MFr Bld: 6.7 % — ABNORMAL HIGH (ref 4.8–5.6)
Mean Plasma Glucose: 146 mg/dL

## 2023-11-23 ENCOUNTER — Ambulatory Visit

## 2024-02-02 ENCOUNTER — Encounter (HOSPITAL_COMMUNITY): Payer: Self-pay | Admitting: Emergency Medicine

## 2024-02-02 ENCOUNTER — Emergency Department (HOSPITAL_COMMUNITY)
Admission: EM | Admit: 2024-02-02 | Discharge: 2024-02-02 | Disposition: A | Discharge status: Home / Self Care | Attending: Emergency Medicine | Admitting: Emergency Medicine

## 2024-02-02 ENCOUNTER — Emergency Department (HOSPITAL_COMMUNITY)

## 2024-02-02 ENCOUNTER — Other Ambulatory Visit: Payer: Self-pay

## 2024-02-02 DIAGNOSIS — Z7982 Long term (current) use of aspirin: Secondary | ICD-10-CM | POA: Insufficient documentation

## 2024-02-02 DIAGNOSIS — J209 Acute bronchitis, unspecified: Secondary | ICD-10-CM | POA: Diagnosis not present

## 2024-02-02 DIAGNOSIS — R0602 Shortness of breath: Secondary | ICD-10-CM | POA: Diagnosis not present

## 2024-02-02 DIAGNOSIS — R059 Cough, unspecified: Secondary | ICD-10-CM | POA: Diagnosis present

## 2024-02-02 LAB — CBC WITH DIFFERENTIAL/PLATELET
Abs Immature Granulocytes: 0.04 K/uL (ref 0.00–0.07)
Basophils Absolute: 0 K/uL (ref 0.0–0.1)
Basophils Relative: 1 %
Eosinophils Absolute: 0.1 K/uL (ref 0.0–0.5)
Eosinophils Relative: 1 %
HCT: 38.8 % (ref 36.0–46.0)
Hemoglobin: 12.6 g/dL (ref 12.0–15.0)
Immature Granulocytes: 1 %
Lymphocytes Relative: 39 %
Lymphs Abs: 2.9 K/uL (ref 0.7–4.0)
MCH: 28.7 pg (ref 26.0–34.0)
MCHC: 32.5 g/dL (ref 30.0–36.0)
MCV: 88.4 fL (ref 80.0–100.0)
Monocytes Absolute: 0.7 K/uL (ref 0.1–1.0)
Monocytes Relative: 9 %
Neutro Abs: 3.8 K/uL (ref 1.7–7.7)
Neutrophils Relative %: 49 %
Platelets: 230 K/uL (ref 150–400)
RBC: 4.39 MIL/uL (ref 3.87–5.11)
RDW: 14 % (ref 11.5–15.5)
WBC: 7.5 K/uL (ref 4.0–10.5)
nRBC: 0 % (ref 0.0–0.2)

## 2024-02-02 LAB — MAGNESIUM: Magnesium: 1.6 mg/dL — ABNORMAL LOW (ref 1.7–2.4)

## 2024-02-02 LAB — BASIC METABOLIC PANEL WITH GFR
Anion gap: 13 (ref 5–15)
BUN: 32 mg/dL — ABNORMAL HIGH (ref 8–23)
CO2: 29 mmol/L (ref 22–32)
Calcium: 9.9 mg/dL (ref 8.9–10.3)
Chloride: 99 mmol/L (ref 98–111)
Creatinine, Ser: 1.16 mg/dL — ABNORMAL HIGH (ref 0.44–1.00)
GFR, Estimated: 46 mL/min — ABNORMAL LOW (ref >60)
Glucose, Bld: 103 mg/dL — ABNORMAL HIGH (ref 70–99)
Potassium: 3.6 mmol/L (ref 3.5–5.1)
Sodium: 141 mmol/L (ref 135–145)

## 2024-02-02 LAB — TROPONIN T, HIGH SENSITIVITY: Troponin T High Sensitivity: <15 ng/L (ref 0–19)

## 2024-02-02 LAB — LACTIC ACID, PLASMA: Lactic Acid, Venous: 0.9 mmol/L (ref 0.5–1.9)

## 2024-02-02 LAB — PRO BRAIN NATRIURETIC PEPTIDE: Pro Brain Natriuretic Peptide: 219 pg/mL (ref <300.0)

## 2024-02-02 LAB — RESP PANEL BY RT-PCR (RSV, FLU A&B, COVID)  RVPGX2
Influenza A by PCR: NEGATIVE
Influenza B by PCR: NEGATIVE
Resp Syncytial Virus by PCR: NEGATIVE
SARS Coronavirus 2 by RT PCR: NEGATIVE

## 2024-02-02 MED ORDER — MAGNESIUM SULFATE 2 GM/50ML IV SOLN
2.0000 g | Freq: Once | INTRAVENOUS | Status: AC
  Administered 2024-02-02: 2 g via INTRAVENOUS
  Filled 2024-02-02: qty 50

## 2024-02-02 MED ORDER — IPRATROPIUM-ALBUTEROL 0.5-2.5 (3) MG/3ML IN SOLN
3.0000 mL | Freq: Once | RESPIRATORY_TRACT | Status: AC
Start: 2024-02-02 — End: 2024-02-02
  Administered 2024-02-02: 3 mL via RESPIRATORY_TRACT
  Filled 2024-02-02: qty 3

## 2024-02-02 MED ORDER — AZITHROMYCIN 250 MG PO TABS: 250.0000 mg | ORAL_TABLET | Freq: Every day | ORAL | 0 refills | Status: AC

## 2024-02-02 NOTE — ED Provider Notes (Signed)
 [place] EMERGENCY DEPARTMENT AT [facility] Provider Note   CSN: [id number] Arrival date & time: 02/02/24  [id number]     Patient presents with: Chest Pain   Stacey Riley is a 83 y.o. female.  She was brought in by ambulance from her primary care doctor for continued cough and shortness of breath.  Symptoms started about 2 weeks ago.  She was treated with a course of prednisone  and doxycycline.  She did not complete the course.  Symptoms persist.  She said it got a little bit better but now is getting worse again.  No fevers or chills.  She denies any chest pain.  She has a cough productive of some white and yellow sputum.  No vomiting diarrhea or urinary symptoms.  She said they also noticed her heart rate was low but it is always low  {Add pertinent medical, surgical, social history, OB history to YEP:67052} The history is provided by the patient and a relative.  Cough Cough characteristics:  Productive Sputum characteristics:  Yellow and white Severity:  Moderate Onset quality:  Gradual Duration:  2 weeks Progression:  Unchanged Chronicity:  Recurrent Smoker: no   Relieved by:  Nothing Associated symptoms: shortness of breath   Associated symptoms: no chest pain, no chills and no fever        Prior to Admission medications   Medication Sig Start Date End Date Taking? Authorizing Provider  acetaminophen  (TYLENOL ) 500 MG tablet Take 500 mg by mouth daily as needed for headache.    [provider]  albuterol  (VENTOLIN  HFA) 108 (90 Base) MCG/ACT inhaler Inhale 2 puffs into the lungs every 6 (six) hours as needed for wheezing or shortness of breath.    [provider]  Alcohol Swabs (DROPSAFE ALCOHOL PREP) 70 % PADS Apply topically. 10/13/22   [provider]  Ascorbic Acid (VITAMIN C) 100 MG tablet Take 100 mg by mouth daily.    [provider]  aspirin  EC 81 MG tablet 1 tablet Orally Once a week    [provider]  Blood  Glucose Calibration (TRUE METRIX LEVEL 1) Low SOLN  03/19/22   [provider]  Blood Glucose Monitoring Suppl (TRUE METRIX METER) w/Device KIT USE AS DIRECTED; Duration: 90    [provider]  Cyanocobalamin (B-12 PO) Take 1 tablet by mouth daily.    [provider]  Famotidine -Ca Carb-Mag Hydrox (PEPCID  COMPLETE PO) Take 1 tablet by mouth as needed (indigestion).    [provider]  hydrochlorothiazide  (HYDRODIURIL ) 25 MG tablet Take 25 mg by mouth daily. 04/22/11   [provider]  iron  polysaccharides (NIFEREX) 150 MG capsule Take 1 capsule (150 mg total) by mouth daily. 11/17/23   Ricky Fines, MD  levothyroxine  (SYNTHROID ) 25 MCG tablet Take 1 tablet (25 mcg total) by mouth daily at 6 (six) AM. 11/18/23   Ricky Fines, MD  lisinopril  (PRINIVIL ,ZESTRIL ) 40 MG tablet Take 40 mg by mouth daily. 04/22/11   [provider]  loratadine  (CLARITIN ) 10 MG tablet Take 1 tablet (10 mg total) by mouth daily. 02/16/16   Singh, Prashant K, MD  Multiple Vitamins-Minerals (VITAMIN D3 COMPLETE) TABS Take 1 capsule by mouth daily.    [provider]  pravastatin  (PRAVACHOL ) 20 MG tablet Take 20 mg by mouth at bedtime.    [provider]  TRUE METRIX BLOOD GLUCOSE TEST test strip  06/07/18   [provider]  TRUEplus Lancets 33G MISC  09/25/22   [provider]  VITAMIN D  PO Take 1 tablet by mouth daily.    [provider]    Allergies: Amoxicillin, Augmentin [amoxicillin-pot clavulanate], Clavulanic acid, Penicillin g, Venofer  [iron  sucrose], and Sulfa antibiotics    Review of Systems  Constitutional:  Negative for chills and fever.  Respiratory:  Positive for cough and shortness of breath.   Cardiovascular:  Negative for chest pain.    Updated Vital Signs BP (!) 190/64   Pulse (!) 47   Temp 97.8 F (36.6 C) (Oral)   Resp 18   Ht 5' 4 (1.626 m)   Wt 72.6 kg   SpO2 97%   BMI 27.46 kg/m   Physical  Exam Vitals and nursing note reviewed.  Constitutional:      General: She is not in acute distress.    Appearance: Normal appearance. She is well-developed.  HENT:     Head: Normocephalic and atraumatic.  Eyes:     Conjunctiva/sclera: Conjunctivae normal.  Cardiovascular:     Rate and Rhythm: Regular rhythm. Bradycardia present.     Heart sounds: Normal heart sounds. No murmur heard. Pulmonary:     Effort: Pulmonary effort is normal. No respiratory distress.     Breath sounds: No stridor. Rhonchi present. No wheezing.  Abdominal:     Palpations: Abdomen is soft.     Tenderness: There is no abdominal tenderness. There is no guarding or rebound.  Musculoskeletal:        General: No deformity.     Cervical back: Neck supple.     Right lower leg: No tenderness. No edema.     Left lower leg: No tenderness. No edema.  Skin:    General: Skin is warm and dry.  Neurological:     General: No focal deficit present.     Mental Status: She is alert.     GCS: GCS eye subscore is 4. GCS verbal subscore is 5. GCS motor subscore is 6.     (all labs ordered are listed, but only abnormal results are displayed) Labs Reviewed - No data to display  EKG: None  Radiology: No results found.  {Document cardiac monitor, telemetry assessment procedure when appropriate:32947} Procedures   Medications Ordered in the ED - No data to display    {Click here for ABCD2, HEART and other calculators REFRESH Note before signing:1}                              Medical Decision Making Amount and/or Complexity of Data Reviewed Labs: ordered. Radiology: ordered.   This patient complains of ***; this involves an extensive number of treatment Options and is a complaint that carries with it a high risk of complications and morbidity. The differential includes ***  I ordered, reviewed and interpreted labs, which included *** I ordered medication *** and reviewed PMP when indicated. I ordered imaging  studies which included *** and I independently    visualized and interpreted imaging which showed *** Additional history obtained from *** Previous records obtained and reviewed *** I consulted *** and discussed lab and imaging findings and discussed disposition.  Cardiac monitoring reviewed, *** Social determinants considered, *** Critical Interventions: ***  After the interventions stated above, I reevaluated the patient and found *** Admission and further testing considered, ***   {Document critical care time when appropriate  Document review of labs and clinical decision tools ie CHADS2VASC2, etc  Document your independent review of radiology images  and any outside records  Document your discussion with family members, caretakers and with consultants  Document social determinants of health affecting pt's care  Document your decision making why or why not admission, treatments were needed:32947:::1}   Final diagnoses:  None    ED Discharge Orders     None

## 2024-02-02 NOTE — Discharge Instructions (Addendum)
 We are starting you on a different antibiotic.  Please finish your steroids.  Continue your inhaler.  Follow-up with your primary care team.  Return to the emergency department if any worsening or concerning symptoms

## 2024-02-02 NOTE — ED Notes (Signed)
Pt ambulated to bathroom independently, tolerated well.

## 2024-02-02 NOTE — ED Triage Notes (Signed)
 Pt in by [facility] from her PCP. Pt has had cough, chest congestion and tightness x2 weeks. Pt has been taking prednisone  and doxycycline.

## 2024-02-02 NOTE — ED Triage Notes (Signed)
 Pt has bradycardia at baseline per ems

## 2024-02-07 LAB — CULTURE, BLOOD (ROUTINE X 2)
Culture: NO GROWTH
Culture: NO GROWTH
Special Requests: ADEQUATE
Special Requests: ADEQUATE

## 2024-04-05 ENCOUNTER — Encounter: Payer: Self-pay | Admitting: Internal Medicine

## 2024-05-04 NOTE — Progress Notes (Unsigned)
 "  GI Office Note    Referring Provider: [facility] Primary Care Physician:  [facility] Primary Gastroenterologist: Lamar HERO.Rourk, MD  Date:  05/05/2024  ID:  Stacey Riley, DOB May 12, 1940, MRN [id number]   Chief Complaint   Chief Complaint  Patient presents with   Follow-up    Follow up. Food is getting stuck in her throat    History of Present Illness  Stacey Riley is a 84 y.o. female with a history of diabetes, asthma, GERD, large hiatal hernia, HTN, and anemia presenting today with complaint of dysphagia.   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  Last seen July 2025.  Last received iron  infusion August 2025 with likely hypersensitivity reaction to Venofer  (hypotension).   OV 10/28/2021.  GERD fairly well-controlled, has occasional tenderness likely due to hernia especially with bending over.  Takes Pepcid  as needed.  Usually takes empirically prior to dietary indiscretion.  Denied dysphagia, nausea, vomiting, lack of appetite, early satiety.  Blood pressure was elevated however she suspected to be due to anxiety from driving.  Advised to wean pantoprazole  to every other day for 2 months and then stop taking and monitor for return of symptoms.  Recommendations given per patient's request to wean off medication.  OV with me 05/01/2022.  Recently weaned off reflux medication stating she does not feel like she needs it anymore.  Will take Pepcid  as needed.  No nausea or vomiting.  Having a little bit intermittent lower abdominal pain which comes and goes.  Having regular bowel  movements.  Pain tends to happen more with bending over.  No diarrhea.  Advised to continue Pepcid  as needed, follow GERD diet and continue to follow with hematology for her anemia.  Denies any melena or BRBPR.  Last office visit 05/05/2023 with Dr. Shaaron.  Not having any dysphagia.  Continue to follow-up with Dr. Rogers regarding MGUS and anemia.  Overall remaining asymptomatic except for occasional reflux symptoms about once per week if overeating.  Denied any alarm symptoms.  Reinforced GERD diet.  Advise she could continue Pepcid  once weekly and as long as that is the only frequency she needs a Pepcid  then no further GI intervention was warranted.  Advised to follow-up in 1 year or sooner if needed..     Latest Ref Rng & Units 02/02/2024    5:25 PM 11/16/2023    4:08 PM 11/02/2023   10:35 AM  CBC  WBC 4.0 - 10.5 K/uL 7.5  9.3  4.9   Hemoglobin 12.0 - 15.0 g/dL 87.3  84.7  86.0   Hematocrit 36.0 - 46.0 % 38.8  47.6  42.5   Platelets 150 - 400 K/uL 230  227  189      Today:   In October found a thyroid  issue - she is concerned that maybe that is contributing. Large hiatal hernia on chest xray in October.   Pills getting stuck in her throat and sometimes food but mostly issue with pills. Gets stuck in the base of her neck. Started here more recently. Started a few months ago. Trouble sleeping feeling like she couldn't breath. Denies lots of  shortness of breath. Tries to cough to break it up but that does not work. Fluid does not help.  Sometimes after going to bed still feels lit but sometimes its a little bit better. Even her small pills get stuck.   Tried dissolving pills and that don't work. If she turns to the right she breathes better but to the left it bothers her.   Last night was the worst but it can happen in the morning or night.   Wt Readings from Last 6 Encounters:  05/05/24 155 lb 12.8 oz (70.7 kg)  02/02/24 160 lb (72.6 kg)  11/16/23 166 lb 0.1 oz (75.3 kg)  11/09/23  162 lb 11.2 oz (73.8 kg)  08/05/23 163 lb (73.9 kg)  05/12/23 162 lb 14.7 oz (73.9 kg)    Body mass index is 26.74 kg/m.   Current Outpatient Medications  Medication Sig Dispense Refill   acetaminophen  (TYLENOL ) 500 MG tablet Take 500 mg by mouth daily as needed for headache.     albuterol  (VENTOLIN  HFA) 108 (90 Base) MCG/ACT inhaler Inhale 2 puffs into the lungs every 6 (six) hours as needed for wheezing or shortness of breath.     Alcohol Swabs (DROPSAFE ALCOHOL PREP) 70 % PADS Apply topically.     Ascorbic Acid (VITAMIN C) 100 MG tablet Take 100 mg by mouth daily.     aspirin  EC 81 MG tablet 1 tablet Orally Once a week     Blood Glucose Calibration (TRUE METRIX LEVEL 1) Low SOLN      Blood Glucose Monitoring Suppl (TRUE METRIX METER) w/Device KIT USE AS DIRECTED; Duration: 90     Cyanocobalamin  (B-12 PO) Take 1 tablet by mouth daily.     Famotidine -Ca Carb-Mag Hydrox (PEPCID  COMPLETE PO) Take 1 tablet by mouth as needed (indigestion).     hydrochlorothiazide  (HYDRODIURIL ) 25 MG tablet Take 25 mg by mouth daily.     iron  polysaccharides (NIFEREX) 150 MG capsule Take 1 capsule (150 mg total) by mouth daily. 30 capsule 3   levothyroxine  (SYNTHROID ) 25 MCG tablet Take 1 tablet (25 mcg total) by mouth daily at 6 (six) AM. 30 tablet 2   lisinopril  (PRINIVIL ,ZESTRIL ) 40 MG tablet Take 40 mg by mouth daily.     loratadine  (CLARITIN ) 10 MG tablet Take 1 tablet (10 mg total) by mouth daily. 30 tablet 0   Multiple Vitamins-Minerals (VITAMIN D3 COMPLETE) TABS Take 1 capsule by mouth daily.     pravastatin  (PRAVACHOL ) 20 MG tablet Take 20 mg by mouth at bedtime.     TRUE METRIX BLOOD GLUCOSE TEST test strip      TRUEplus Lancets 33G MISC      VITAMIN D  PO Take 1 tablet by mouth daily.     azithromycin  (ZITHROMAX ) 250 MG tablet Take 1 tablet (250 mg total) by mouth daily. Take first 2 tablets together, then 1 every day until finished. (Patient not taking: Reported on 05/05/2024) 6 tablet 0   No  current facility-administered medications for this visit.    Past Medical History:  Diagnosis Date   Anemia 03/14/2016   Arthritis    Asthma    Bradycardia    DM (diabetes mellitus) (HCC)    GERD (gastroesophageal reflux disease)    High cholesterol    HTN (hypertension)     Past Surgical History:  Procedure Laterality Date   CHOLECYSTECTOMY  1990s   COLONOSCOPY  03/18/1999   Dr Rourk-int  hemorrhoids, pancolonic diverticula   COLONOSCOPY N/A 09/26/2016  Dr. Shaaron: Diverticulosis, 4 mm polyp removed from the descending colon which is a tubular adenoma.  No future colonoscopies unless new symptoms arise.   COLONOSCOPY WITH ESOPHAGOGASTRODUODENOSCOPY (EGD)  08/06/2011   Rourk: Schatzki ring, moderate sized hiatal hernia status post dilation for history of dysphagia. Pancolonic diverticula, single diminutive polyp at the base of the cecum removed (tubular adenoma). Next colonoscopy recommended for April 2018   ESOPHAGOGASTRODUODENOSCOPY  07/02/01   Dr Rourk-Schatzki's ring s/p 59F maloney dilation, otherwise normal/small hiatal hernia/   Linear erosion and ulceration in the proximal stomach/  The ulcerated lesion in the proximal stomach  benign.This may be a Ole lesion secondary to trauma to the mucosa, straddling the  diaphragmatic hiatus in the presence of a hiatal hernia    ESOPHAGOGASTRODUODENOSCOPY N/A 02/14/2016   Dr. Shaaron: Large hiatal hernia, Ole lesion likely explains bleeding.   MALONEY DILATION  08/06/2011   Procedure: AGAPITO DILATION;  Surgeon: Lamar CHRISTELLA Shaaron, MD;  Location: [facility];  Service: Endoscopy;  Laterality: N/A;   POLYPECTOMY  09/26/2016   Procedure: POLYPECTOMY;  Surgeon: Shaaron Lamar CHRISTELLA, MD;  Location: [facility];  Service: Endoscopy;;  descending colon   TUBAL LIGATION      Family History  Problem Relation Age of Onset   Aneurysm Daughter    Stroke Daughter    Diabetes Daughter    Hypertension Mother    Dementia Mother    Hypertension  Father    Kidney cancer Brother    Cancer Brother        brain   Cancer Brother        bone   Stroke Brother    Hypertension Daughter    Hypertension Son    Hyperlipidemia Son    Colon cancer Neg Hx    Inflammatory bowel disease Neg Hx     Allergies as of 05/05/2024 - Review Complete 05/05/2024  Allergen Reaction Noted   Amoxicillin Other (See Comments) 05/05/2019   Augmentin [amoxicillin-pot clavulanate] Nausea And Vomiting and Other (See Comments) 02/14/2016   Clavulanic acid Nausea And Vomiting 07/08/2017   Penicillin g Nausea And Vomiting 07/08/2017   Venofer  [iron  sucrose] Other (See Comments) 11/16/2023   Sulfa antibiotics Swelling 07/14/2011    Social History   Socioeconomic History   Marital status: Divorced    Spouse name: Not on file   Number of children: 5   Years of education: Not on file   Highest education level: Not on file  Occupational History   Occupation: retired; designer, fashion/clothing  Tobacco Use   Smoking status: Never   Smokeless tobacco: Never  Vaping Use   Vaping status: Never Used  Substance and Sexual Activity   Alcohol use: No   Drug use: No   Sexual activity: Not on file  Other Topics Concern   Not on file  Social History Narrative   Lives alone   Social Drivers of Health   Tobacco Use: Low Risk (05/05/2024)   Patient History    Smoking Tobacco Use: Never    Smokeless Tobacco Use: Never    Passive Exposure: Not on file  Financial Resource Strain: Not on file  Food Insecurity: No Food Insecurity (11/16/2023)   Epic    Worried About Programme Researcher, Broadcasting/film/video in the Last Year: Never true    Ran Out of Food in the Last Year: Never true  Transportation Needs: No Transportation Needs (11/16/2023)   Epic    Lack of Transportation (Medical): No    Lack of  Transportation (Non-Medical): No  Physical Activity: Not on file  Stress: Not on file  Social Connections: Unknown (11/16/2023)   Social Connection and Isolation Panel    Frequency of Communication with  Friends and Family: More than three times a week    Frequency of Social Gatherings with Friends and Family: Three times a week    Attends Religious Services: More than 4 times per year    Active Member of Clubs or Organizations: Yes    Attends Banker Meetings: More than 4 times per year    Marital Status: Patient declined  Depression (PHQ2-9): Low Risk (11/16/2023)   Depression (PHQ2-9)    PHQ-2 Score: 0  Alcohol Screen: Not on file  Housing: Low Risk (11/16/2023)   Epic    Unable to Pay for Housing in the Last Year: No    Number of Times Moved in the Last Year: 0    Homeless in the Last Year: No  Utilities: Not At Risk (11/16/2023)   Epic    Threatened with loss of utilities: No  Health Literacy: Not on file    Review of Systems   Gen: Denies fever, chills, anorexia. Denies fatigue, weakness, weight loss.  CV: Denies chest pain, palpitations, syncope, peripheral edema, and claudication. Resp: Denies dyspnea at rest, cough, wheezing, coughing up blood, and pleurisy. GI: See HPI Derm: Denies rash, itching, dry skin Psych: Denies depression, anxiety, memory loss, confusion. No homicidal or suicidal ideation.  Heme: Denies bruising, bleeding, and enlarged lymph nodes.  Physical Exam   BP 139/62 (BP Location: Right Arm, Patient Position: Sitting, Cuff Size: Normal)   Pulse (!) 56   Temp 97.7 F (36.5 C) (Temporal)   Ht 5' 4 (1.626 m)   Wt 155 lb 12.8 oz (70.7 kg)   BMI 26.74 kg/m   General:   Alert and oriented. No distress noted. Pleasant and cooperative.  Head:  Normocephalic and atraumatic. Eyes:  Conjuctiva clear without scleral icterus. Mouth:  Oral mucosa pink and moist.  No lesions. Neck: No evidence of thyromegaly or goiter. Lungs:  Clear to auscultation bilaterally. No wheezes, rales, or rhonchi. No distress.  Heart:  S1, S2 present without murmurs appreciated.  Abdomen:  +BS, soft, non-tender and non-distended. No rebound or guarding. No HSM or masses  noted. Rectal: Deferred Msk:  Symmetrical without gross deformities. Normal posture. Extremities:  Without edema. Neurologic:  Alert and  oriented x4 Psych:  Alert and cooperative. Normal mood and affect.  Assessment & Plan  Stacey Riley is a 84 y.o. female presenting today with complaints of dysphagia.  Dysphagia, GERD, known large hiatal hernia: History of GERD and last upper endoscopy in 2017 with known large hiatal hernia and Ole lesions noted she was treated with Carafate  and PPI and had remained on treatment for some time for her reflux and was slowly weaned off of the time.  She has been off and H2 blocker and PPI for well over 1-2 years.  Denies any significant reflux symptoms at this time but has been experiencing dysphagia over the last several months more with pills and sometimes solid foods.  Symptoms could be secondary to her large hiatal hernia but also could be secondary to reflux, esophagitis, Schatzki's ring, esophageal web, esophageal stricture or other stenosis.  Less likely related to her thyroid , no goiter or thyromegaly on exam today. - Starting with an evaluation with barium esophagram - Dysphagia precautions discussed along with modifications to medication administration (using applesauce, yogurt, or pudding  etc. to take medications) - Starting omeprazole  40 mg daily empirically for possible reflux related dysphagia. - May need plan for upper endoscopy if any narrowing noted on esophagram  Follow up   Follow up pending esophagram results.   Charmaine Melia, MSN, FNP-BC, AGACNP-BC [facility] "

## 2024-05-05 ENCOUNTER — Inpatient Hospital Stay: Attending: Hematology

## 2024-05-05 ENCOUNTER — Telehealth: Payer: Self-pay | Admitting: *Deleted

## 2024-05-05 ENCOUNTER — Encounter: Payer: Self-pay | Admitting: Gastroenterology

## 2024-05-05 ENCOUNTER — Ambulatory Visit: Admitting: Gastroenterology

## 2024-05-05 VITALS — BP 139/62 | HR 56 | Temp 97.7°F | Ht 64.0 in | Wt 155.8 lb

## 2024-05-05 DIAGNOSIS — K219 Gastro-esophageal reflux disease without esophagitis: Secondary | ICD-10-CM

## 2024-05-05 DIAGNOSIS — K449 Diaphragmatic hernia without obstruction or gangrene: Secondary | ICD-10-CM

## 2024-05-05 DIAGNOSIS — D509 Iron deficiency anemia, unspecified: Secondary | ICD-10-CM | POA: Diagnosis not present

## 2024-05-05 DIAGNOSIS — D472 Monoclonal gammopathy: Secondary | ICD-10-CM | POA: Diagnosis present

## 2024-05-05 DIAGNOSIS — D500 Iron deficiency anemia secondary to blood loss (chronic): Secondary | ICD-10-CM

## 2024-05-05 DIAGNOSIS — R1310 Dysphagia, unspecified: Secondary | ICD-10-CM

## 2024-05-05 LAB — CBC WITH DIFFERENTIAL/PLATELET
Abs Immature Granulocytes: 0.02 K/uL (ref 0.00–0.07)
Basophils Absolute: 0 K/uL (ref 0.0–0.1)
Basophils Relative: 1 %
Eosinophils Absolute: 0.1 K/uL (ref 0.0–0.5)
Eosinophils Relative: 1 %
HCT: 43.8 % (ref 36.0–46.0)
Hemoglobin: 13.8 g/dL (ref 12.0–15.0)
Immature Granulocytes: 0 %
Lymphocytes Relative: 44 %
Lymphs Abs: 2.7 K/uL (ref 0.7–4.0)
MCH: 28 pg (ref 26.0–34.0)
MCHC: 31.5 g/dL (ref 30.0–36.0)
MCV: 88.8 fL (ref 80.0–100.0)
Monocytes Absolute: 0.5 K/uL (ref 0.1–1.0)
Monocytes Relative: 8 %
Neutro Abs: 2.8 K/uL (ref 1.7–7.7)
Neutrophils Relative %: 46 %
Platelets: 227 K/uL (ref 150–400)
RBC: 4.93 MIL/uL (ref 3.87–5.11)
RDW: 14.2 % (ref 11.5–15.5)
WBC: 6 K/uL (ref 4.0–10.5)
nRBC: 0 % (ref 0.0–0.2)

## 2024-05-05 LAB — COMPREHENSIVE METABOLIC PANEL WITH GFR
ALT: 14 U/L (ref 0–44)
AST: 22 U/L (ref 15–41)
Albumin: 4.7 g/dL (ref 3.5–5.0)
Alkaline Phosphatase: 59 U/L (ref 38–126)
Anion gap: 16 — ABNORMAL HIGH (ref 5–15)
BUN: 15 mg/dL (ref 8–23)
CO2: 25 mmol/L (ref 22–32)
Calcium: 10.3 mg/dL (ref 8.9–10.3)
Chloride: 99 mmol/L (ref 98–111)
Creatinine, Ser: 0.95 mg/dL (ref 0.44–1.00)
GFR, Estimated: 59 mL/min — ABNORMAL LOW
Glucose, Bld: 112 mg/dL — ABNORMAL HIGH (ref 70–99)
Potassium: 3.6 mmol/L (ref 3.5–5.1)
Sodium: 141 mmol/L (ref 135–145)
Total Bilirubin: 0.4 mg/dL (ref 0.0–1.2)
Total Protein: 8.5 g/dL — ABNORMAL HIGH (ref 6.5–8.1)

## 2024-05-05 LAB — IRON AND TIBC
Iron: 51 ug/dL (ref 28–170)
Saturation Ratios: 14 % (ref 10.4–31.8)
TIBC: 358 ug/dL (ref 250–450)
UIBC: 308 ug/dL

## 2024-05-05 LAB — LACTATE DEHYDROGENASE: LDH: 173 U/L (ref 105–235)

## 2024-05-05 LAB — FERRITIN: Ferritin: 117 ng/mL (ref 11–307)

## 2024-05-05 MED ORDER — OMEPRAZOLE 40 MG PO CPDR: 40.0000 mg | DELAYED_RELEASE_CAPSULE | Freq: Every day | ORAL | 1 refills | Status: AC

## 2024-05-05 NOTE — Patient Instructions (Addendum)
 Take small bites and alterante with sips of liquids.  Avoid rough textures and super dry foods.  When  taking her medications I want you to try to take it with either applesauce, yogurt, or a low bit of ice cream or something that has a little bit of a smoother consistency to help your pills go down easier.  We will get you set up for a special x-ray to further assess your swallowing.  I have sent in omeprazole  for you to take 40 mg (1 tablet) daily about 30 minutes prior to breakfast to see if there is any acid reflux contributing to your swallowing trouble.  If there is documented any significant narrowing on your swallowing x-ray we may need to highly consider doing an upper endoscopy and potentially dilating her esophagus.  We will determine follow-up after we receive the results of your x-ray.  It was a pleasure to see you today. I want to create trusting relationships with patients. If you receive a survey regarding your visit,  I greatly appreciate you taking time to fill this out on paper or through your MyChart. I value your feedback.  Charmaine Melia, MSN, FNP-BC, AGACNP-BC [facility]

## 2024-05-05 NOTE — Telephone Encounter (Signed)
 Pt informed of BPE appointment date, time and location.

## 2024-05-05 NOTE — Telephone Encounter (Signed)
 LMTRC   BPE is scheduled for Monday, 05/16/24, arrive at 8 am to check in at [facility].

## 2024-05-06 LAB — KAPPA/LAMBDA LIGHT CHAINS
Kappa free light chain: 24.4 mg/L — ABNORMAL HIGH (ref 3.3–19.4)
Kappa, lambda light chain ratio: 1.14 (ref 0.26–1.65)
Lambda free light chains: 21.4 mg/L (ref 5.7–26.3)

## 2024-05-09 ENCOUNTER — Inpatient Hospital Stay

## 2024-05-09 LAB — PROTEIN ELECTROPHORESIS, SERUM
A/G Ratio: 0.9 (ref 0.7–1.7)
Albumin ELP: 3.9 g/dL (ref 2.9–4.4)
Alpha-1-Globulin: 0.3 g/dL (ref 0.0–0.4)
Alpha-2-Globulin: 1 g/dL (ref 0.4–1.0)
Beta Globulin: 1 g/dL (ref 0.7–1.3)
Gamma Globulin: 1.8 g/dL (ref 0.4–1.8)
Globulin, Total: 4.2 g/dL — ABNORMAL HIGH (ref 2.2–3.9)
M-Spike, %: 1 g/dL — ABNORMAL HIGH
Total Protein ELP: 8.1 g/dL (ref 6.0–8.5)

## 2024-05-15 ENCOUNTER — Encounter: Payer: Self-pay | Admitting: *Deleted

## 2024-05-16 ENCOUNTER — Inpatient Hospital Stay: Admitting: Physician Assistant

## 2024-05-16 ENCOUNTER — Ambulatory Visit (HOSPITAL_COMMUNITY)

## 2024-05-20 NOTE — Progress Notes (Unsigned)
 "  [facility] [address] [place], [place] [address]   CLINIC:  Medical Oncology/Hematology  PCP:  [facility] [address] [place] [address] [phone]    *** RESUME PREP BELOW *** ***Include hypersensitivity reaction note!***    REASON FOR VISIT:  Follow-up for MGUS and iron  deficiency anemia   PRIOR THERAPY: Intermittent parenteral iron  therapy   CURRENT THERAPY: Surveillance, iron  pill  INTERVAL HISTORY:   [name] 84 y.o. female returns for routine follow-up of MGUS and iron  deficiency anemia.   She was last seen by Pleasant Barefoot PA-C on 05/12/2023.  At today's visit, she reports feeling fair.    She denies any recent bleeding such as epistaxis, hematemesis, hematochezia, or melena. She has some dark stool when she takes her iron  pill (every other day). She reports that she has intermittent fatigue that depends on the day, with some days where she feels real tired; she notes decreased stamina and states that she tires quickly after her activities.   She does not have any headaches, lightheadedness, syncope, pica, restless legs, chest pain, or dyspnea on exertion.   She is taking iron  tablet every other day without any significant side effects.     She has ongoing pain in her knees, but denies any new bone pain or recent fractures.  She reports occasional hot flashes and mild night sweats on her face.   She denies any unexplained fever, chills, or weight loss.   No new neurologic symptoms such as tinnitus, new-onset hearing loss, blurred vision, headache, or dizziness.    She has some chronic and intermittent numbness / tingling in feet.  No thromboembolic events since her last visit.   No new masses or lymphadenopathy per her report.   She has 50% energy and 100% appetite. She endorses that she is maintaining a stable weight.  ASSESSMENT & PLAN:  1.   IgG lambda MGUS: - Diagnosed in 2018.  Levels have been  relatively stable since that time, with a 1% chance per year of progression to multiple myeloma. - Immunofixation confirms IgG monoclonal protein with lambda light chain specificity - 24-hour urine/UPEP (11/11/2022) within normal limits - Most recent skeletal survey (11/02/2023): Lucent lesion in left mid femoral shaft and left humeral head, appearing similar to previous (10/31/2022). - Most recent myeloma panel (11/02/2023):  SPEP with M spike stable 0.7 Normal FLC ratio 1.11 (minimally elevated kappa 21.6, normal lambda 19.5) Normal LDH Calcium 9.2, creatinine 1.01, Hgb 13.9 - No new bone pain or B symptoms. - PLAN: RTC in 6 months for repeat myeloma/MGUS panel - Annual SKELETAL SURVEY due July 2026.   2.  Iron  deficiency anemia: - Thought to be secondary to chronic GI blood loss - EGD (November 2017): Cameron lesions in the stomach, considered to be possible source of bleeding - Colonoscopy (09/26/2016): 4 mm polyp with pathology showing tubular adenoma - She is required intermittent IV iron , most recently with IV Venofer  300 mg x 3 in July/August 2023 - No epistaxis, hematemesis, hematochezia, or melena. - She has been taking ferrous sulfate  daily since January 2023, which she is tolerating well  - Most recent labs (11/02/2023): Hgb 13.9, ferritin 27, iron  saturation 10% - PLAN: Recommend IV Venofer  400 mg x 2 - Recommend patient that she continue iron  pill daily. - Repeat CBC/iron  panel and RTC in 6 months.   3.  Left ankle lesion - Onset of new mole to left lateral ankle around May  2024 - Patient reported evolving lesion that was increasing in size, changing color, and becoming pruritic in the short time since it appeared - On exam (11/07/2022), lesion is multicolored (grayish base with purpleish black raised area), irregular border, asymmetric, and approximately 1.0 cm diameter - Seen by dermatology [employer] PA-C at Dr. Milford office) on 11/26/2022. I have reviewed records sent  by dermatology office. Suspicious lesion on left ankle diagnosed as irritated seborrheic keratosis, with instructions to return to dermatology clinic as needed    PLAN SUMMARY:  >> Venofer  400 mg x 2 >> Labs in 6 months = CBC/D, CMP, LDH, iron /TIBC, ferritin, SPEP, light chains >> OFFICE visit in 6 months (1 week after labs)     REVIEW OF SYSTEMS:   Review of Systems  Constitutional:  Positive for fatigue (comes and goes). Negative for appetite change, chills, diaphoresis, fever and unexpected weight change.  HENT:   Negative for lump/mass and nosebleeds.   Eyes:  Negative for eye problems.  Respiratory:  Positive for shortness of breath (with exertion). Negative for cough and hemoptysis.   Cardiovascular:  Negative for chest pain, leg swelling and palpitations.  Gastrointestinal:  Negative for abdominal pain, blood in stool, constipation, diarrhea, nausea and vomiting.  Genitourinary:  Negative for hematuria.   Musculoskeletal:  Positive for arthralgias (arthritis).  Skin: Negative.   Neurological:  Positive for numbness. Negative for dizziness, headaches and light-headedness.  Hematological:  Does not bruise/bleed easily.  Psychiatric/Behavioral:  Positive for sleep disturbance.      PHYSICAL EXAM:  ECOG PERFORMANCE STATUS: 1 - Symptomatic but completely ambulatory  There were no vitals filed for this visit.   There were no vitals filed for this visit.   Physical Exam Constitutional:      Appearance: Normal appearance. She is obese.  Cardiovascular:     Heart sounds: Normal heart sounds.  Pulmonary:     Breath sounds: Normal breath sounds.  Neurological:     General: No focal deficit present.     Mental Status: Mental status is at baseline.  Psychiatric:        Behavior: Behavior normal. Behavior is cooperative.     PAST MEDICAL/SURGICAL HISTORY:  Past Medical History:  Diagnosis Date   Anemia 03/14/2016   Arthritis    Asthma    Bradycardia    DM  (diabetes mellitus) (HCC)    GERD (gastroesophageal reflux disease)    High cholesterol    HTN (hypertension)    Past Surgical History:  Procedure Laterality Date   CHOLECYSTECTOMY  1990s   COLONOSCOPY  03/18/1999   Dr Rourk-int  hemorrhoids, pancolonic diverticula   COLONOSCOPY N/A 09/26/2016   Dr. Shaaron: Diverticulosis, 4 mm polyp removed from the descending colon which is a tubular adenoma.  No future colonoscopies unless new symptoms arise.   COLONOSCOPY WITH ESOPHAGOGASTRODUODENOSCOPY (EGD)  08/06/2011   Rourk: Schatzki ring, moderate sized hiatal hernia status post dilation for history of dysphagia. Pancolonic diverticula, single diminutive polyp at the base of the cecum removed (tubular adenoma). Next colonoscopy recommended for April 2018   ESOPHAGOGASTRODUODENOSCOPY  07/02/01   Dr Rourk-Schatzki's ring s/p 55F maloney dilation, otherwise normal/small hiatal hernia/   Linear erosion and ulceration in the proximal stomach/  The ulcerated lesion in the proximal stomach  benign.This may be a Ole lesion secondary to trauma to the mucosa, straddling the  diaphragmatic hiatus in the presence of a hiatal hernia    ESOPHAGOGASTRODUODENOSCOPY N/A 02/14/2016   Dr. Shaaron: Large hiatal hernia, Ole  lesion likely explains bleeding.   MALONEY DILATION  08/06/2011   Procedure: AGAPITO DILATION;  Surgeon: Lamar CHRISTELLA Hollingshead, MD;  Location: [facility];  Service: Endoscopy;  Laterality: N/A;   POLYPECTOMY  09/26/2016   Procedure: POLYPECTOMY;  Surgeon: Hollingshead Lamar CHRISTELLA, MD;  Location: [facility];  Service: Endoscopy;;  descending colon   TUBAL LIGATION      SOCIAL HISTORY:  Social History   Socioeconomic History   Marital status: Divorced    Spouse name: Not on file   Number of children: 5   Years of education: Not on file   Highest education level: Not on file  Occupational History   Occupation: retired; designer, fashion/clothing  Tobacco Use   Smoking status: Never   Smokeless tobacco: Never  Vaping  Use   Vaping status: Never Used  Substance and Sexual Activity   Alcohol use: No   Drug use: No   Sexual activity: Not on file  Other Topics Concern   Not on file  Social History Narrative   Lives alone   Social Drivers of Health   Tobacco Use: Low Risk (05/05/2024)   Patient History    Smoking Tobacco Use: Never    Smokeless Tobacco Use: Never    Passive Exposure: Not on file  Financial Resource Strain: Not on file  Food Insecurity: No Food Insecurity (11/16/2023)   Epic    Worried About Programme Researcher, Broadcasting/film/video in the Last Year: Never true    Ran Out of Food in the Last Year: Never true  Transportation Needs: No Transportation Needs (11/16/2023)   Epic    Lack of Transportation (Medical): No    Lack of Transportation (Non-Medical): No  Physical Activity: Not on file  Stress: Not on file  Social Connections: Unknown (11/16/2023)   Social Connection and Isolation Panel    Frequency of Communication with Friends and Family: More than three times a week    Frequency of Social Gatherings with Friends and Family: Three times a week    Attends Religious Services: More than 4 times per year    Active Member of Clubs or Organizations: Yes    Attends Banker Meetings: More than 4 times per year    Marital Status: Patient declined  Intimate Partner Violence: Not At Risk (11/16/2023)   Epic    Fear of Current or Ex-Partner: No    Emotionally Abused: No    Physically Abused: No    Sexually Abused: No  Depression (PHQ2-9): Low Risk (11/16/2023)   Depression (PHQ2-9)    PHQ-2 Score: 0  Alcohol Screen: Not on file  Housing: Low Risk (11/16/2023)   Epic    Unable to Pay for Housing in the Last Year: No    Number of Times Moved in the Last Year: 0    Homeless in the Last Year: No  Utilities: Not At Risk (11/16/2023)   Epic    Threatened with loss of utilities: No  Health Literacy: Not on file    FAMILY HISTORY:  Family History  Problem Relation Age of Onset   Aneurysm Daughter     Stroke Daughter    Diabetes Daughter    Hypertension Mother    Dementia Mother    Hypertension Father    Kidney cancer Brother    Cancer Brother        brain   Cancer Brother        bone   Stroke Brother    Hypertension Daughter  Hypertension Son    Hyperlipidemia Son    Colon cancer Neg Hx    Inflammatory bowel disease Neg Hx     CURRENT MEDICATIONS:  Outpatient Encounter Medications as of 05/23/2024  Medication Sig   acetaminophen  (TYLENOL ) 500 MG tablet Take 500 mg by mouth daily as needed for headache.   albuterol  (VENTOLIN  HFA) 108 (90 Base) MCG/ACT inhaler Inhale 2 puffs into the lungs every 6 (six) hours as needed for wheezing or shortness of breath.   Alcohol Swabs (DROPSAFE ALCOHOL PREP) 70 % PADS Apply topically.   Ascorbic Acid (VITAMIN C) 100 MG tablet Take 100 mg by mouth daily.   aspirin  EC 81 MG tablet 1 tablet Orally Once a week   azithromycin  (ZITHROMAX ) 250 MG tablet Take 1 tablet (250 mg total) by mouth daily. Take first 2 tablets together, then 1 every day until finished. (Patient not taking: Reported on 05/05/2024)   Blood Glucose Calibration (TRUE METRIX LEVEL 1) Low SOLN    Blood Glucose Monitoring Suppl (TRUE METRIX METER) w/Device KIT USE AS DIRECTED; Duration: 90   Cyanocobalamin  (B-12 PO) Take 1 tablet by mouth daily.   Famotidine -Ca Carb-Mag Hydrox (PEPCID  COMPLETE PO) Take 1 tablet by mouth as needed (indigestion).   hydrochlorothiazide  (HYDRODIURIL ) 25 MG tablet Take 25 mg by mouth daily.   iron  polysaccharides (NIFEREX) 150 MG capsule Take 1 capsule (150 mg total) by mouth daily.   levothyroxine  (SYNTHROID ) 25 MCG tablet Take 1 tablet (25 mcg total) by mouth daily at 6 (six) AM.   lisinopril  (PRINIVIL ,ZESTRIL ) 40 MG tablet Take 40 mg by mouth daily.   loratadine  (CLARITIN ) 10 MG tablet Take 1 tablet (10 mg total) by mouth daily.   Multiple Vitamins-Minerals (VITAMIN D3 COMPLETE) TABS Take 1 capsule by mouth daily.   omeprazole  (PRILOSEC) 40 MG  capsule Take 1 capsule (40 mg total) by mouth daily.   pravastatin  (PRAVACHOL ) 20 MG tablet Take 20 mg by mouth at bedtime.   TRUE METRIX BLOOD GLUCOSE TEST test strip    TRUEplus Lancets 33G MISC    VITAMIN D  PO Take 1 tablet by mouth daily.   No facility-administered encounter medications on file as of 05/23/2024.    ALLERGIES:  Allergies  Allergen Reactions   Amoxicillin Other (See Comments)    GI Bleeding   Augmentin [Amoxicillin-Pot Clavulanate] Nausea And Vomiting and Other (See Comments)    Hematemesis    Clavulanic Acid Nausea And Vomiting   Penicillin G Nausea And Vomiting        Venofer  [Iron  Sucrose] Other (See Comments)    Hypotension; Medicated with solumedrol and IVF bolus; See progress note for 11/16/2023   Sulfa Antibiotics Swelling    LABORATORY DATA:  I have reviewed the labs as listed.  CBC    Component Value Date/Time   WBC 6.0 05/05/2024 1119   RBC 4.93 05/05/2024 1119   HGB 13.8 05/05/2024 1119   HCT 43.8 05/05/2024 1119   PLT 227 05/05/2024 1119   MCV 88.8 05/05/2024 1119   MCH 28.0 05/05/2024 1119   MCHC 31.5 05/05/2024 1119   RDW 14.2 05/05/2024 1119   LYMPHSABS 2.7 05/05/2024 1119   MONOABS 0.5 05/05/2024 1119   EOSABS 0.1 05/05/2024 1119   BASOSABS 0.0 05/05/2024 1119      Latest Ref Rng & Units 05/05/2024   11:19 AM 02/02/2024    5:25 PM 11/17/2023    4:38 AM  CMP  Glucose 70 - 99 mg/dL 887  896  810   BUN  8 - 23 mg/dL 15  32  18   Creatinine 0.44 - 1.00 mg/dL 9.04  8.83  9.13   Sodium 135 - 145 mmol/L 141  141  140   Potassium 3.5 - 5.1 mmol/L 3.6  3.6  5.0   Chloride 98 - 111 mmol/L 99  99  110   CO2 22 - 32 mmol/L 25  29  21    Calcium 8.9 - 10.3 mg/dL 89.6  9.9  8.5   Total Protein 6.5 - 8.1 g/dL 8.5     Total Bilirubin 0.0 - 1.2 mg/dL 0.4     Alkaline Phos 38 - 126 U/L 59     AST 15 - 41 U/L 22     ALT 0 - 44 U/L 14       DIAGNOSTIC IMAGING:  I have independently reviewed the relevant imaging and discussed with the  patient.   WRAP UP:  All questions were answered. The patient knows to call the clinic with any problems, questions or concerns.  Medical decision making: Moderate  Time spent on visit: I spent 20 minutes counseling the patient face to face. The total time spent in the appointment was 30 minutes and more than 50% was on counseling.  Pleasant CHRISTELLA Barefoot, PA-C  05/20/24 9:52 AM  "

## 2024-05-23 ENCOUNTER — Inpatient Hospital Stay: Attending: Hematology | Admitting: Physician Assistant

## 2024-05-23 ENCOUNTER — Ambulatory Visit (HOSPITAL_COMMUNITY)
